# Patient Record
Sex: Female | Born: 1960 | Race: White | Hispanic: No | Marital: Married | Smoking: Never smoker
Health system: Southern US, Community
[De-identification: ages and names within clinical notes are randomized; demographics above are authoritative.]

## PROBLEM LIST (undated history)

## (undated) DIAGNOSIS — I351 Nonrheumatic aortic (valve) insufficiency: Secondary | ICD-10-CM

## (undated) DIAGNOSIS — R4701 Aphasia: Secondary | ICD-10-CM

## (undated) DIAGNOSIS — I712 Thoracic aortic aneurysm, without rupture: Secondary | ICD-10-CM

## (undated) DIAGNOSIS — I7121 Aneurysm of the ascending aorta, without rupture: Secondary | ICD-10-CM

## (undated) DIAGNOSIS — I639 Cerebral infarction, unspecified: Secondary | ICD-10-CM

## (undated) HISTORY — DX: Aneurysm of the ascending aorta, without rupture: I71.21

## (undated) HISTORY — DX: Aphasia: R47.01

## (undated) HISTORY — DX: Cerebral infarction, unspecified: I63.9

## (undated) HISTORY — DX: Thoracic aortic aneurysm, without rupture: I71.2

## (undated) HISTORY — DX: Nonrheumatic aortic (valve) insufficiency: I35.1

---

## 2012-09-11 DIAGNOSIS — I77810 Thoracic aortic ectasia: Secondary | ICD-10-CM

## 2012-09-11 DIAGNOSIS — Q211 Atrial septal defect: Secondary | ICD-10-CM

## 2012-09-11 DIAGNOSIS — Q2112 Patent foramen ovale: Secondary | ICD-10-CM

## 2012-09-11 HISTORY — DX: Atrial septal defect: Q21.1

## 2012-09-11 HISTORY — DX: Thoracic aortic ectasia: I77.810

## 2012-09-11 HISTORY — DX: Patent foramen ovale: Q21.12

## 2016-02-07 DIAGNOSIS — Z8669 Personal history of other diseases of the nervous system and sense organs: Secondary | ICD-10-CM | POA: Diagnosis not present

## 2016-02-07 DIAGNOSIS — I10 Essential (primary) hypertension: Secondary | ICD-10-CM | POA: Diagnosis not present

## 2016-02-07 DIAGNOSIS — Z0001 Encounter for general adult medical examination with abnormal findings: Secondary | ICD-10-CM | POA: Diagnosis not present

## 2016-02-14 DIAGNOSIS — Z1231 Encounter for screening mammogram for malignant neoplasm of breast: Secondary | ICD-10-CM | POA: Diagnosis not present

## 2016-02-16 DIAGNOSIS — R634 Abnormal weight loss: Secondary | ICD-10-CM | POA: Diagnosis not present

## 2016-02-23 DIAGNOSIS — I77810 Thoracic aortic ectasia: Secondary | ICD-10-CM | POA: Diagnosis not present

## 2016-02-23 DIAGNOSIS — R9431 Abnormal electrocardiogram [ECG] [EKG]: Secondary | ICD-10-CM | POA: Diagnosis not present

## 2016-02-23 DIAGNOSIS — Q211 Atrial septal defect: Secondary | ICD-10-CM | POA: Diagnosis not present

## 2016-02-23 HISTORY — PX: OTHER SURGICAL HISTORY: SHX169

## 2016-03-03 DIAGNOSIS — Z86010 Personal history of colonic polyps: Secondary | ICD-10-CM | POA: Diagnosis not present

## 2016-03-03 DIAGNOSIS — Z8673 Personal history of transient ischemic attack (TIA), and cerebral infarction without residual deficits: Secondary | ICD-10-CM | POA: Diagnosis not present

## 2016-03-03 DIAGNOSIS — Z1211 Encounter for screening for malignant neoplasm of colon: Secondary | ICD-10-CM | POA: Diagnosis not present

## 2016-03-03 DIAGNOSIS — I10 Essential (primary) hypertension: Secondary | ICD-10-CM | POA: Diagnosis not present

## 2016-03-03 DIAGNOSIS — D124 Benign neoplasm of descending colon: Secondary | ICD-10-CM | POA: Diagnosis not present

## 2016-03-03 DIAGNOSIS — D125 Benign neoplasm of sigmoid colon: Secondary | ICD-10-CM | POA: Diagnosis not present

## 2016-03-03 DIAGNOSIS — D126 Benign neoplasm of colon, unspecified: Secondary | ICD-10-CM | POA: Diagnosis not present

## 2016-03-03 DIAGNOSIS — D122 Benign neoplasm of ascending colon: Secondary | ICD-10-CM | POA: Diagnosis not present

## 2016-03-03 DIAGNOSIS — K5730 Diverticulosis of large intestine without perforation or abscess without bleeding: Secondary | ICD-10-CM | POA: Diagnosis not present

## 2016-03-03 DIAGNOSIS — Z79899 Other long term (current) drug therapy: Secondary | ICD-10-CM | POA: Diagnosis not present

## 2016-03-06 DIAGNOSIS — Z01419 Encounter for gynecological examination (general) (routine) without abnormal findings: Secondary | ICD-10-CM | POA: Diagnosis not present

## 2016-03-08 DIAGNOSIS — Q211 Atrial septal defect: Secondary | ICD-10-CM | POA: Diagnosis not present

## 2016-03-08 DIAGNOSIS — J9811 Atelectasis: Secondary | ICD-10-CM | POA: Diagnosis not present

## 2016-03-08 DIAGNOSIS — R05 Cough: Secondary | ICD-10-CM | POA: Diagnosis not present

## 2016-03-08 DIAGNOSIS — I69320 Aphasia following cerebral infarction: Secondary | ICD-10-CM | POA: Diagnosis not present

## 2016-03-08 DIAGNOSIS — I7101 Dissection of thoracic aorta: Secondary | ICD-10-CM | POA: Diagnosis not present

## 2016-03-08 DIAGNOSIS — J40 Bronchitis, not specified as acute or chronic: Secondary | ICD-10-CM | POA: Diagnosis not present

## 2016-03-08 DIAGNOSIS — I083 Combined rheumatic disorders of mitral, aortic and tricuspid valves: Secondary | ICD-10-CM | POA: Diagnosis present

## 2016-03-08 DIAGNOSIS — R0689 Other abnormalities of breathing: Secondary | ICD-10-CM | POA: Diagnosis not present

## 2016-03-08 DIAGNOSIS — R1310 Dysphagia, unspecified: Secondary | ICD-10-CM | POA: Diagnosis not present

## 2016-03-08 DIAGNOSIS — I712 Thoracic aortic aneurysm, without rupture: Secondary | ICD-10-CM | POA: Diagnosis not present

## 2016-03-08 DIAGNOSIS — Z4682 Encounter for fitting and adjustment of non-vascular catheter: Secondary | ICD-10-CM | POA: Diagnosis not present

## 2016-03-08 DIAGNOSIS — J939 Pneumothorax, unspecified: Secondary | ICD-10-CM | POA: Diagnosis not present

## 2016-03-08 DIAGNOSIS — J9589 Other postprocedural complications and disorders of respiratory system, not elsewhere classified: Secondary | ICD-10-CM | POA: Diagnosis not present

## 2016-03-08 DIAGNOSIS — D62 Acute posthemorrhagic anemia: Secondary | ICD-10-CM | POA: Diagnosis not present

## 2016-03-08 DIAGNOSIS — I081 Rheumatic disorders of both mitral and tricuspid valves: Secondary | ICD-10-CM | POA: Diagnosis not present

## 2016-03-08 DIAGNOSIS — R0602 Shortness of breath: Secondary | ICD-10-CM | POA: Diagnosis not present

## 2016-03-08 DIAGNOSIS — R918 Other nonspecific abnormal finding of lung field: Secondary | ICD-10-CM | POA: Diagnosis not present

## 2016-03-08 DIAGNOSIS — R739 Hyperglycemia, unspecified: Secondary | ICD-10-CM | POA: Diagnosis not present

## 2016-03-08 DIAGNOSIS — I517 Cardiomegaly: Secondary | ICD-10-CM | POA: Diagnosis not present

## 2016-03-08 DIAGNOSIS — I340 Nonrheumatic mitral (valve) insufficiency: Secondary | ICD-10-CM | POA: Diagnosis not present

## 2016-03-08 DIAGNOSIS — R0902 Hypoxemia: Secondary | ICD-10-CM | POA: Diagnosis not present

## 2016-03-08 DIAGNOSIS — I519 Heart disease, unspecified: Secondary | ICD-10-CM | POA: Diagnosis not present

## 2016-03-08 DIAGNOSIS — Z48812 Encounter for surgical aftercare following surgery on the circulatory system: Secondary | ICD-10-CM | POA: Diagnosis not present

## 2016-03-08 DIAGNOSIS — I5189 Other ill-defined heart diseases: Secondary | ICD-10-CM | POA: Diagnosis not present

## 2016-03-08 DIAGNOSIS — J984 Other disorders of lung: Secondary | ICD-10-CM | POA: Diagnosis not present

## 2016-03-08 DIAGNOSIS — I77810 Thoracic aortic ectasia: Secondary | ICD-10-CM | POA: Diagnosis not present

## 2016-03-08 DIAGNOSIS — J90 Pleural effusion, not elsewhere classified: Secondary | ICD-10-CM | POA: Diagnosis not present

## 2016-03-08 DIAGNOSIS — I4891 Unspecified atrial fibrillation: Secondary | ICD-10-CM | POA: Diagnosis not present

## 2016-03-08 DIAGNOSIS — I69311 Memory deficit following cerebral infarction: Secondary | ICD-10-CM | POA: Diagnosis not present

## 2016-03-08 DIAGNOSIS — I7411 Embolism and thrombosis of thoracic aorta: Secondary | ICD-10-CM | POA: Diagnosis not present

## 2016-03-09 DIAGNOSIS — I71019 Dissection of thoracic aorta, unspecified: Secondary | ICD-10-CM | POA: Insufficient documentation

## 2016-03-09 DIAGNOSIS — I7101 Dissection of thoracic aorta: Secondary | ICD-10-CM | POA: Insufficient documentation

## 2016-03-09 HISTORY — DX: Dissection of thoracic aorta, unspecified: I71.019

## 2016-03-09 HISTORY — DX: Dissection of thoracic aorta: I71.01

## 2016-03-10 DIAGNOSIS — R739 Hyperglycemia, unspecified: Secondary | ICD-10-CM | POA: Insufficient documentation

## 2016-03-10 HISTORY — DX: Hyperglycemia, unspecified: R73.9

## 2016-03-28 DIAGNOSIS — K449 Diaphragmatic hernia without obstruction or gangrene: Secondary | ICD-10-CM | POA: Diagnosis not present

## 2016-03-28 DIAGNOSIS — Z48812 Encounter for surgical aftercare following surgery on the circulatory system: Secondary | ICD-10-CM | POA: Diagnosis not present

## 2016-03-28 DIAGNOSIS — E7800 Pure hypercholesterolemia, unspecified: Secondary | ICD-10-CM | POA: Diagnosis not present

## 2016-03-28 DIAGNOSIS — R9431 Abnormal electrocardiogram [ECG] [EKG]: Secondary | ICD-10-CM | POA: Diagnosis not present

## 2016-03-28 DIAGNOSIS — R918 Other nonspecific abnormal finding of lung field: Secondary | ICD-10-CM | POA: Diagnosis not present

## 2016-03-28 DIAGNOSIS — J9811 Atelectasis: Secondary | ICD-10-CM | POA: Diagnosis not present

## 2016-03-28 DIAGNOSIS — Z9889 Other specified postprocedural states: Secondary | ICD-10-CM | POA: Diagnosis not present

## 2016-04-18 DIAGNOSIS — I429 Cardiomyopathy, unspecified: Secondary | ICD-10-CM | POA: Diagnosis not present

## 2016-04-18 DIAGNOSIS — I7101 Dissection of thoracic aorta: Secondary | ICD-10-CM | POA: Diagnosis not present

## 2016-08-21 DIAGNOSIS — Z9889 Other specified postprocedural states: Secondary | ICD-10-CM | POA: Diagnosis not present

## 2016-08-21 DIAGNOSIS — I429 Cardiomyopathy, unspecified: Secondary | ICD-10-CM | POA: Diagnosis not present

## 2016-08-21 DIAGNOSIS — Q211 Atrial septal defect: Secondary | ICD-10-CM | POA: Diagnosis not present

## 2016-09-08 DIAGNOSIS — D519 Vitamin B12 deficiency anemia, unspecified: Secondary | ICD-10-CM | POA: Diagnosis not present

## 2016-09-08 DIAGNOSIS — R5383 Other fatigue: Secondary | ICD-10-CM | POA: Diagnosis not present

## 2016-09-08 DIAGNOSIS — I10 Essential (primary) hypertension: Secondary | ICD-10-CM | POA: Diagnosis not present

## 2016-09-08 DIAGNOSIS — E559 Vitamin D deficiency, unspecified: Secondary | ICD-10-CM | POA: Diagnosis not present

## 2016-09-08 DIAGNOSIS — R5381 Other malaise: Secondary | ICD-10-CM | POA: Diagnosis not present

## 2016-09-08 DIAGNOSIS — Z79899 Other long term (current) drug therapy: Secondary | ICD-10-CM | POA: Diagnosis not present

## 2016-09-13 DIAGNOSIS — Z9889 Other specified postprocedural states: Secondary | ICD-10-CM | POA: Diagnosis not present

## 2016-09-13 DIAGNOSIS — Q211 Atrial septal defect: Secondary | ICD-10-CM | POA: Diagnosis not present

## 2016-09-13 DIAGNOSIS — I429 Cardiomyopathy, unspecified: Secondary | ICD-10-CM | POA: Diagnosis not present

## 2016-10-12 DIAGNOSIS — I519 Heart disease, unspecified: Secondary | ICD-10-CM | POA: Diagnosis not present

## 2016-10-12 DIAGNOSIS — I255 Ischemic cardiomyopathy: Secondary | ICD-10-CM | POA: Diagnosis not present

## 2016-12-28 DIAGNOSIS — M5137 Other intervertebral disc degeneration, lumbosacral region: Secondary | ICD-10-CM | POA: Diagnosis not present

## 2016-12-28 DIAGNOSIS — M9904 Segmental and somatic dysfunction of sacral region: Secondary | ICD-10-CM | POA: Diagnosis not present

## 2016-12-28 DIAGNOSIS — M9903 Segmental and somatic dysfunction of lumbar region: Secondary | ICD-10-CM | POA: Diagnosis not present

## 2016-12-28 DIAGNOSIS — M5136 Other intervertebral disc degeneration, lumbar region: Secondary | ICD-10-CM | POA: Diagnosis not present

## 2016-12-28 DIAGNOSIS — M5441 Lumbago with sciatica, right side: Secondary | ICD-10-CM | POA: Diagnosis not present

## 2017-01-02 DIAGNOSIS — M9903 Segmental and somatic dysfunction of lumbar region: Secondary | ICD-10-CM | POA: Diagnosis not present

## 2017-01-02 DIAGNOSIS — M9904 Segmental and somatic dysfunction of sacral region: Secondary | ICD-10-CM | POA: Diagnosis not present

## 2017-01-02 DIAGNOSIS — M5137 Other intervertebral disc degeneration, lumbosacral region: Secondary | ICD-10-CM | POA: Diagnosis not present

## 2017-01-02 DIAGNOSIS — M5441 Lumbago with sciatica, right side: Secondary | ICD-10-CM | POA: Diagnosis not present

## 2017-01-02 DIAGNOSIS — M5136 Other intervertebral disc degeneration, lumbar region: Secondary | ICD-10-CM | POA: Diagnosis not present

## 2017-01-04 DIAGNOSIS — M9904 Segmental and somatic dysfunction of sacral region: Secondary | ICD-10-CM | POA: Diagnosis not present

## 2017-01-04 DIAGNOSIS — M5136 Other intervertebral disc degeneration, lumbar region: Secondary | ICD-10-CM | POA: Diagnosis not present

## 2017-01-04 DIAGNOSIS — M9903 Segmental and somatic dysfunction of lumbar region: Secondary | ICD-10-CM | POA: Diagnosis not present

## 2017-01-04 DIAGNOSIS — M5137 Other intervertebral disc degeneration, lumbosacral region: Secondary | ICD-10-CM | POA: Diagnosis not present

## 2017-01-04 DIAGNOSIS — M5441 Lumbago with sciatica, right side: Secondary | ICD-10-CM | POA: Diagnosis not present

## 2017-01-17 DIAGNOSIS — M5441 Lumbago with sciatica, right side: Secondary | ICD-10-CM | POA: Diagnosis not present

## 2017-01-17 DIAGNOSIS — M5137 Other intervertebral disc degeneration, lumbosacral region: Secondary | ICD-10-CM | POA: Diagnosis not present

## 2017-01-17 DIAGNOSIS — M9903 Segmental and somatic dysfunction of lumbar region: Secondary | ICD-10-CM | POA: Diagnosis not present

## 2017-01-17 DIAGNOSIS — M5136 Other intervertebral disc degeneration, lumbar region: Secondary | ICD-10-CM | POA: Diagnosis not present

## 2017-01-17 DIAGNOSIS — M9904 Segmental and somatic dysfunction of sacral region: Secondary | ICD-10-CM | POA: Diagnosis not present

## 2017-01-22 DIAGNOSIS — M5136 Other intervertebral disc degeneration, lumbar region: Secondary | ICD-10-CM | POA: Diagnosis not present

## 2017-01-22 DIAGNOSIS — M9903 Segmental and somatic dysfunction of lumbar region: Secondary | ICD-10-CM | POA: Diagnosis not present

## 2017-01-22 DIAGNOSIS — M5137 Other intervertebral disc degeneration, lumbosacral region: Secondary | ICD-10-CM | POA: Diagnosis not present

## 2017-01-22 DIAGNOSIS — M5441 Lumbago with sciatica, right side: Secondary | ICD-10-CM | POA: Diagnosis not present

## 2017-01-22 DIAGNOSIS — M9904 Segmental and somatic dysfunction of sacral region: Secondary | ICD-10-CM | POA: Diagnosis not present

## 2017-01-29 DIAGNOSIS — M9903 Segmental and somatic dysfunction of lumbar region: Secondary | ICD-10-CM | POA: Diagnosis not present

## 2017-01-29 DIAGNOSIS — M9904 Segmental and somatic dysfunction of sacral region: Secondary | ICD-10-CM | POA: Diagnosis not present

## 2017-01-29 DIAGNOSIS — M5441 Lumbago with sciatica, right side: Secondary | ICD-10-CM | POA: Diagnosis not present

## 2017-01-29 DIAGNOSIS — M5136 Other intervertebral disc degeneration, lumbar region: Secondary | ICD-10-CM | POA: Diagnosis not present

## 2017-01-29 DIAGNOSIS — M5137 Other intervertebral disc degeneration, lumbosacral region: Secondary | ICD-10-CM | POA: Diagnosis not present

## 2017-02-23 DIAGNOSIS — M5136 Other intervertebral disc degeneration, lumbar region: Secondary | ICD-10-CM | POA: Diagnosis not present

## 2017-02-23 DIAGNOSIS — M5441 Lumbago with sciatica, right side: Secondary | ICD-10-CM | POA: Diagnosis not present

## 2017-02-23 DIAGNOSIS — M9903 Segmental and somatic dysfunction of lumbar region: Secondary | ICD-10-CM | POA: Diagnosis not present

## 2017-02-23 DIAGNOSIS — M9904 Segmental and somatic dysfunction of sacral region: Secondary | ICD-10-CM | POA: Diagnosis not present

## 2017-02-23 DIAGNOSIS — M5137 Other intervertebral disc degeneration, lumbosacral region: Secondary | ICD-10-CM | POA: Diagnosis not present

## 2017-03-30 DIAGNOSIS — M5441 Lumbago with sciatica, right side: Secondary | ICD-10-CM | POA: Diagnosis not present

## 2017-03-30 DIAGNOSIS — M5137 Other intervertebral disc degeneration, lumbosacral region: Secondary | ICD-10-CM | POA: Diagnosis not present

## 2017-03-30 DIAGNOSIS — M5136 Other intervertebral disc degeneration, lumbar region: Secondary | ICD-10-CM | POA: Diagnosis not present

## 2017-03-30 DIAGNOSIS — M9903 Segmental and somatic dysfunction of lumbar region: Secondary | ICD-10-CM | POA: Diagnosis not present

## 2017-03-30 DIAGNOSIS — M9904 Segmental and somatic dysfunction of sacral region: Secondary | ICD-10-CM | POA: Diagnosis not present

## 2017-04-10 DIAGNOSIS — I10 Essential (primary) hypertension: Secondary | ICD-10-CM | POA: Diagnosis not present

## 2017-04-25 ENCOUNTER — Ambulatory Visit (INDEPENDENT_AMBULATORY_CARE_PROVIDER_SITE_OTHER): Payer: Medicare HMO

## 2017-04-25 ENCOUNTER — Ambulatory Visit (INDEPENDENT_AMBULATORY_CARE_PROVIDER_SITE_OTHER): Payer: Medicare HMO | Admitting: Sports Medicine

## 2017-04-25 ENCOUNTER — Encounter: Payer: Self-pay | Admitting: Sports Medicine

## 2017-04-25 DIAGNOSIS — R52 Pain, unspecified: Secondary | ICD-10-CM

## 2017-04-25 DIAGNOSIS — M2170 Unequal limb length (acquired), unspecified site: Secondary | ICD-10-CM | POA: Diagnosis not present

## 2017-04-25 DIAGNOSIS — M2142 Flat foot [pes planus] (acquired), left foot: Secondary | ICD-10-CM

## 2017-04-25 DIAGNOSIS — R269 Unspecified abnormalities of gait and mobility: Secondary | ICD-10-CM | POA: Diagnosis not present

## 2017-04-25 DIAGNOSIS — M2141 Flat foot [pes planus] (acquired), right foot: Secondary | ICD-10-CM

## 2017-04-25 NOTE — Progress Notes (Signed)
Subjective: Stacy Jenkins is a 56 y.o. female patient who presents to office for evaluation of both feet and ankles turning out work. Patient reports that there is no pain, however, significant functional abnormality. Her husband. Patient has suffered a stroke with the subsequent changes to both feet and ankles. Admits a history of flat feet ever since she was a child. However, since after stroke areas have worsened in appearance currently uses new balance tennis shoes and over-the-counter Dr. Felicie Morn cushioned insole. Patient denies any other pedal complaints.   There are no active problems to display for this patient.  No current outpatient prescriptions on file prior to visit.   No current facility-administered medications on file prior to visit.    Allergies  Allergen Reactions  . Penicillin G Rash     Objective:  General: Alert and oriented x3 in no acute distress  Dermatology: No open lesions bilateral lower extremities, no webspace macerations, no ecchymosis bilateral, all nails x 10 are well manicured.  Vascular: Dorsalis Pedis and Posterior Tibial pedal pulses 1/4, Capillary Fill Time 3 seconds, (+) pedal hair growth bilateral, no edema bilateral lower extremities, Temperature gradient within normal limits.  Neurology: Gross sensation intact via light touch bilateral.  Musculoskeletal:No tenderness to palpation along medial arch, or fascial band bilateral. There is no tenderness along Posterior tibial tendon course with medial soft tissue buldge noted, there is decreased ankle rom with knee extending  vs flexed resembling gastroc equnius bilateral, Subtalar joint range of motion is limited, there is medial arch collapse Right> Left on weightbearing exam, end-stage valgus with ankle impingement and ankle valgus, + "too-many toes" sign appreciated, unable to perform heel rise test due to severity of flat feet and history of stroke with the right limb being longer and the left limb  shorter with a small amount of strength difference between the left and right with the left being greater than and 4 out of 5 in the right is graded 5 out of 5.  Gait apropulsive.  Xrays Right/Left ankle:  Decreased osseous mineralization. Joint spaces not preserved, arthritis and joint space narrowing consistent with severe changes from pes planus. No fracture. Increased Talar head uncovering present. Anterior break in cyma line with midtarsal breach present. Increased Talar declination present. Decreased calcaneal inclination present.  No soft tissue abnormalities or radiopaque foreign bodies.   Assessment and Plan: Problem List Items Addressed This Visit    None    Visit Diagnoses    Pes planus of both feet    -  Primary   Pain       Relevant Orders   DG Ankle Complete Left   DG Ankle Complete Right   Abnormality of gait       Lower limb length difference       Right longer      -Complete examination performed -Xrays reviewed -Discussed treatement options; discussed pes planus deformity;conservative and  surgical including but not limited to the use of bone grafts, bone cuts, tendon lengthening/reconstruction to achieve improvement in flatfoot deformity on Right>Left foot -Recommend patient to have a AFO or SMO/UCBL orthotic made to help control and prevent further changes from severe pes planus -Meanwhile Recommend continue with good supportive shoes  -Patient to return to office to see for SMO/UCBL or AFO casting  Landis Martins, DPM

## 2017-04-25 NOTE — Progress Notes (Signed)
   Subjective:    Patient ID: Stacy Jenkins, female    DOB: 10-Jan-1961, 56 y.o.   MRN: [id number]  HPI   Husband states that patient had a stroke and that the ankles turn in and patient states that they do not hurt and I am not a diabetic and do have inserts and no burning or throbbing and not any swelling and no numbness or tingling     Review of Systems  All other systems reviewed and are negative.      Objective:   Physical Exam        Assessment & Plan:

## 2017-04-30 DIAGNOSIS — M9903 Segmental and somatic dysfunction of lumbar region: Secondary | ICD-10-CM | POA: Diagnosis not present

## 2017-04-30 DIAGNOSIS — M5137 Other intervertebral disc degeneration, lumbosacral region: Secondary | ICD-10-CM | POA: Diagnosis not present

## 2017-04-30 DIAGNOSIS — M9904 Segmental and somatic dysfunction of sacral region: Secondary | ICD-10-CM | POA: Diagnosis not present

## 2017-04-30 DIAGNOSIS — M5441 Lumbago with sciatica, right side: Secondary | ICD-10-CM | POA: Diagnosis not present

## 2017-04-30 DIAGNOSIS — M5136 Other intervertebral disc degeneration, lumbar region: Secondary | ICD-10-CM | POA: Diagnosis not present

## 2017-05-02 ENCOUNTER — Ambulatory Visit (INDEPENDENT_AMBULATORY_CARE_PROVIDER_SITE_OTHER): Payer: Medicare HMO | Admitting: Sports Medicine

## 2017-05-02 DIAGNOSIS — M2142 Flat foot [pes planus] (acquired), left foot: Secondary | ICD-10-CM | POA: Diagnosis not present

## 2017-05-02 DIAGNOSIS — R269 Unspecified abnormalities of gait and mobility: Secondary | ICD-10-CM | POA: Diagnosis not present

## 2017-05-02 DIAGNOSIS — M2170 Unequal limb length (acquired), unspecified site: Secondary | ICD-10-CM | POA: Diagnosis not present

## 2017-05-02 DIAGNOSIS — M722 Plantar fascial fibromatosis: Secondary | ICD-10-CM | POA: Diagnosis not present

## 2017-05-02 DIAGNOSIS — M2141 Flat foot [pes planus] (acquired), right foot: Secondary | ICD-10-CM | POA: Diagnosis not present

## 2017-05-02 NOTE — Progress Notes (Addendum)
Patient met with Stacy Jenkins and was casted for custom molded UCBL orthotics. Rx sent to [facility]. Patient to return to pick up orthotics when they arrive to office Dr. Cannon Kettle

## 2017-05-15 DIAGNOSIS — Z9889 Other specified postprocedural states: Secondary | ICD-10-CM | POA: Diagnosis not present

## 2017-05-15 DIAGNOSIS — I10 Essential (primary) hypertension: Secondary | ICD-10-CM | POA: Diagnosis not present

## 2017-05-15 DIAGNOSIS — I519 Heart disease, unspecified: Secondary | ICD-10-CM | POA: Diagnosis not present

## 2017-05-15 DIAGNOSIS — Q211 Atrial septal defect: Secondary | ICD-10-CM | POA: Diagnosis not present

## 2017-05-15 DIAGNOSIS — I088 Other rheumatic multiple valve diseases: Secondary | ICD-10-CM | POA: Diagnosis not present

## 2017-05-15 DIAGNOSIS — I429 Cardiomyopathy, unspecified: Secondary | ICD-10-CM | POA: Diagnosis not present

## 2017-06-05 DIAGNOSIS — M5136 Other intervertebral disc degeneration, lumbar region: Secondary | ICD-10-CM | POA: Diagnosis not present

## 2017-06-05 DIAGNOSIS — M9904 Segmental and somatic dysfunction of sacral region: Secondary | ICD-10-CM | POA: Diagnosis not present

## 2017-06-05 DIAGNOSIS — M5441 Lumbago with sciatica, right side: Secondary | ICD-10-CM | POA: Diagnosis not present

## 2017-06-05 DIAGNOSIS — M9903 Segmental and somatic dysfunction of lumbar region: Secondary | ICD-10-CM | POA: Diagnosis not present

## 2017-06-05 DIAGNOSIS — M5137 Other intervertebral disc degeneration, lumbosacral region: Secondary | ICD-10-CM | POA: Diagnosis not present

## 2017-06-06 ENCOUNTER — Ambulatory Visit (INDEPENDENT_AMBULATORY_CARE_PROVIDER_SITE_OTHER): Payer: Medicare HMO | Admitting: Sports Medicine

## 2017-06-06 DIAGNOSIS — M722 Plantar fascial fibromatosis: Secondary | ICD-10-CM

## 2017-06-06 DIAGNOSIS — M2170 Unequal limb length (acquired), unspecified site: Secondary | ICD-10-CM

## 2017-06-06 DIAGNOSIS — R269 Unspecified abnormalities of gait and mobility: Secondary | ICD-10-CM

## 2017-06-06 DIAGNOSIS — M2142 Flat foot [pes planus] (acquired), left foot: Secondary | ICD-10-CM

## 2017-06-06 DIAGNOSIS — M2141 Flat foot [pes planus] (acquired), right foot: Secondary | ICD-10-CM

## 2017-06-06 NOTE — Progress Notes (Signed)
Patient was seen and evaluated by [facility]. Patient was dispensed 1 pair of custom functional foot orthotics with wear and break-in period explained. Patient to return for orthotic check as scheduled. -Dr. Cannon Kettle

## 2017-07-13 ENCOUNTER — Encounter (HOSPITAL_COMMUNITY): Payer: Self-pay | Admitting: General Practice

## 2017-07-13 ENCOUNTER — Inpatient Hospital Stay (HOSPITAL_COMMUNITY)
Admission: AD | Admit: 2017-07-13 | Discharge: 2017-07-16 | DRG: 064 | Disposition: A | Discharge status: Home / Self Care | Payer: Medicare HMO | Source: Other Acute Inpatient Hospital | Attending: Neurology | Admitting: Neurology

## 2017-07-13 DIAGNOSIS — I6300 Cerebral infarction due to thrombosis of unspecified precerebral artery: Secondary | ICD-10-CM | POA: Diagnosis not present

## 2017-07-13 DIAGNOSIS — E663 Overweight: Secondary | ICD-10-CM | POA: Diagnosis not present

## 2017-07-13 DIAGNOSIS — R4701 Aphasia: Secondary | ICD-10-CM

## 2017-07-13 DIAGNOSIS — Z79899 Other long term (current) drug therapy: Secondary | ICD-10-CM | POA: Diagnosis not present

## 2017-07-13 DIAGNOSIS — Z9282 Status post administration of tPA (rtPA) in a different facility within the last 24 hours prior to admission to current facility: Secondary | ICD-10-CM

## 2017-07-13 DIAGNOSIS — I69320 Aphasia following cerebral infarction: Secondary | ICD-10-CM

## 2017-07-13 DIAGNOSIS — Z7982 Long term (current) use of aspirin: Secondary | ICD-10-CM | POA: Diagnosis not present

## 2017-07-13 DIAGNOSIS — I639 Cerebral infarction, unspecified: Secondary | ICD-10-CM | POA: Diagnosis not present

## 2017-07-13 DIAGNOSIS — I480 Paroxysmal atrial fibrillation: Secondary | ICD-10-CM | POA: Diagnosis present

## 2017-07-13 DIAGNOSIS — I351 Nonrheumatic aortic (valve) insufficiency: Secondary | ICD-10-CM | POA: Diagnosis not present

## 2017-07-13 DIAGNOSIS — R29704 NIHSS score 4: Secondary | ICD-10-CM | POA: Diagnosis not present

## 2017-07-13 DIAGNOSIS — I10 Essential (primary) hypertension: Secondary | ICD-10-CM | POA: Diagnosis present

## 2017-07-13 DIAGNOSIS — R531 Weakness: Secondary | ICD-10-CM | POA: Diagnosis not present

## 2017-07-13 DIAGNOSIS — I7121 Aneurysm of the ascending aorta, without rupture: Secondary | ICD-10-CM

## 2017-07-13 DIAGNOSIS — Z6826 Body mass index (BMI) 26.0-26.9, adult: Secondary | ICD-10-CM

## 2017-07-13 DIAGNOSIS — R402143 Coma scale, eyes open, spontaneous, at hospital admission: Secondary | ICD-10-CM | POA: Diagnosis present

## 2017-07-13 DIAGNOSIS — I7771 Dissection of carotid artery: Secondary | ICD-10-CM | POA: Diagnosis not present

## 2017-07-13 DIAGNOSIS — Z8673 Personal history of transient ischemic attack (TIA), and cerebral infarction without residual deficits: Secondary | ICD-10-CM | POA: Diagnosis not present

## 2017-07-13 DIAGNOSIS — E785 Hyperlipidemia, unspecified: Secondary | ICD-10-CM | POA: Diagnosis not present

## 2017-07-13 DIAGNOSIS — I63512 Cerebral infarction due to unspecified occlusion or stenosis of left middle cerebral artery: Principal | ICD-10-CM | POA: Diagnosis present

## 2017-07-13 DIAGNOSIS — Q211 Atrial septal defect: Secondary | ICD-10-CM

## 2017-07-13 DIAGNOSIS — R402363 Coma scale, best motor response, obeys commands, at hospital admission: Secondary | ICD-10-CM | POA: Diagnosis present

## 2017-07-13 DIAGNOSIS — Z880 Allergy status to penicillin: Secondary | ICD-10-CM

## 2017-07-13 DIAGNOSIS — R29810 Facial weakness: Secondary | ICD-10-CM | POA: Diagnosis present

## 2017-07-13 DIAGNOSIS — R402253 Coma scale, best verbal response, oriented, at hospital admission: Secondary | ICD-10-CM | POA: Diagnosis present

## 2017-07-13 DIAGNOSIS — I63312 Cerebral infarction due to thrombosis of left middle cerebral artery: Secondary | ICD-10-CM | POA: Diagnosis not present

## 2017-07-13 DIAGNOSIS — I712 Thoracic aortic aneurysm, without rupture: Secondary | ICD-10-CM

## 2017-07-13 DIAGNOSIS — R29818 Other symptoms and signs involving the nervous system: Secondary | ICD-10-CM | POA: Diagnosis not present

## 2017-07-13 DIAGNOSIS — I361 Nonrheumatic tricuspid (valve) insufficiency: Secondary | ICD-10-CM | POA: Diagnosis not present

## 2017-07-13 HISTORY — DX: Cerebral infarction, unspecified: I63.9

## 2017-07-13 LAB — MRSA PCR SCREENING: MRSA BY PCR: NEGATIVE

## 2017-07-13 MED ORDER — SODIUM CHLORIDE 0.9 % IV BOLUS (SEPSIS)
1000.0000 mL | Freq: Once | INTRAVENOUS | Status: AC
  Administered 2017-07-13: 1000 mL via INTRAVENOUS

## 2017-07-13 MED ORDER — PANTOPRAZOLE SODIUM 40 MG IV SOLR
40.0000 mg | Freq: Every day | INTRAVENOUS | Status: DC
  Administered 2017-07-13: 40 mg via INTRAVENOUS
  Filled 2017-07-13: qty 40

## 2017-07-13 MED ORDER — ACETAMINOPHEN 160 MG/5ML PO SOLN: 650.0000 mg | ORAL | Status: DC | PRN

## 2017-07-13 MED ORDER — STROKE: EARLY STAGES OF RECOVERY BOOK
Freq: Once | Status: AC
  Administered 2017-07-13: 18:00:00
  Filled 2017-07-13: qty 1

## 2017-07-13 MED ORDER — ACETAMINOPHEN 650 MG RE SUPP: 650.0000 mg | RECTAL | Status: DC | PRN

## 2017-07-13 MED ORDER — SODIUM CHLORIDE 0.9 % IV SOLN
INTRAVENOUS | Status: DC
  Administered 2017-07-13 – 2017-07-15 (×3): via INTRAVENOUS

## 2017-07-13 MED ORDER — ACETAMINOPHEN 325 MG PO TABS
650.0000 mg | ORAL_TABLET | ORAL | Status: DC | PRN
  Administered 2017-07-13 – 2017-07-15 (×3): 650 mg via ORAL
  Filled 2017-07-13 (×3): qty 2

## 2017-07-13 NOTE — H&P (Signed)
Neurology H&P  CC: Aphasia  History is obtained from:patient   HPI: Stacy Jenkins is a 56 y.o. female with a history of previous stroke which caused some difficulty with speech who presented to [place] [facility]. A code stroke was called and she was given IV tpa by teleneurology.   She did have some improvement with IV tpa but still with persistent aphasia.    LKW: 12:30 pm tpa given?: yes  ROS: A 14 point ROS was performed and is negative except as noted in the HPI.  Past Medical History:  Diagnosis Date  . Stroke [facility])    Home meds: carvedilol, lisinopril, pravastatin, ASA  History reviewed. No pertinent family history.   Social History:  has an unknown smoking status. She has never used smokeless tobacco. Her alcohol and drug histories are not on file.   Exam: Current vital signs: BP 124/69   Pulse 71   Temp 98.4 F (36.9 C) (Oral)   Resp 16   Ht 5\' 7"  (1.702 m)   Wt 79.1 kg (174 lb 6.1 oz)   SpO2 100%   BMI 27.31 kg/m  Vital signs in last 24 hours: Temp:  [98.4 F (36.9 C)] 98.4 F (36.9 C) (07/20 1730) Pulse Rate:  [66-76] 71 (07/20 1845) Resp:  [16-18] 16 (07/20 1845) BP: (124-145)/(63-81) 124/69 (07/20 1845) SpO2:  [97 %-100 %] 100 % (07/20 1845) Weight:  [79.1 kg (174 lb 6.1 oz)] 79.1 kg (174 lb 6.1 oz) (07/20 1830)  Physical Exam  Constitutional: Appears well-developed and well-nourished.  Psych: Affect appropriate to situation Eyes: No scleral injection HENT: No OP obstrucion Head: Normocephalic.  Cardiovascular: Normal rate and regular rhythm.  Respiratory: Effort normal and breath sounds normal to anterior ascultation GI: Soft.  No distension. There is no tenderness.  Skin: WDI  Neuro: Mental Status: Patient is awake, alert, oriented to person, place, year She has a significant aphasia, though is able to follow some commands and answer some questions.  Cranial Nerves: II: Visual Fields are full. Pupils are equal, round, and reactive to  light.   III,IV, VI: EOMI without ptosis or diploplia.  V: Facial sensation is symmetric to temperature VII: Facial movement with mild right facial droop. VIII: hearing is intact to voice X: Uvula elevates symmetrically XI: Shoulder shrug is symmetric. XII: tongue is midline without atrophy or fasciculations.  Motor: Tone is normal. Bulk is normal. 5/5 strength was present in all four extremities.  Sensory: Sensation is symmetric to light touch and temperature in the arms and legs. Cerebellar: FNF and HKS are intact bilaterally  I have reviewed the images obtained: CT head - negaitve, CTA - no LVO  Impression: 56 yo F with acute aphasia and right facial weakness consistent with ischemic stroke now s/p IV tpa.   Recommendations: 1. HgbA1c, fasting lipid panel 2. MRI of the brain without contrast 24 hours after tpa 3. Frequent neuro checks 4. Echocardiogram 6. Prophylactic therapy-none for 24 hours.  7. Risk factor modification 8. Telemetry monitoring 9. PT consult, OT consult, Speech consult 10. please page stroke NP  Or  PA  Or MD  from 8am -4 pm as this patient will be followed by the stroke team at this point.   You can look them up on [email or web address]    Roland Rack, MD [facility] [phone]  If 7pm- 7am, please page neurology on call as listed in [place]. 07/13/2017  7:02 PM

## 2017-07-13 NOTE — Progress Notes (Signed)
Pt arrived at 1725 to 4N17.  Per transfer documentation and phone report, tPA administered at 1431.  MD notified of arrival, orders received for admission.  Per [facility] staff, pt slightly more confused upon arrival than when in transit.  Dr. Leonel Ramsay made aware, 1 L NS bolus ordered/administered.  Pt passed stroke swallow screen.  Educational documents given to family. See doc flowsheets for assessments.

## 2017-07-14 ENCOUNTER — Inpatient Hospital Stay (HOSPITAL_COMMUNITY): Payer: Medicare HMO

## 2017-07-14 DIAGNOSIS — I63512 Cerebral infarction due to unspecified occlusion or stenosis of left middle cerebral artery: Principal | ICD-10-CM

## 2017-07-14 DIAGNOSIS — I361 Nonrheumatic tricuspid (valve) insufficiency: Secondary | ICD-10-CM

## 2017-07-14 LAB — ECHOCARDIOGRAM COMPLETE
HEIGHTINCHES: 67 in
WEIGHTICAEL: 2790.14 [oz_av]

## 2017-07-14 LAB — RAPID URINE DRUG SCREEN, HOSP PERFORMED
Amphetamines: NOT DETECTED
BARBITURATES: NOT DETECTED
BENZODIAZEPINES: NOT DETECTED
Cocaine: NOT DETECTED
Opiates: NOT DETECTED
Tetrahydrocannabinol: NOT DETECTED

## 2017-07-14 LAB — URINALYSIS, ROUTINE W REFLEX MICROSCOPIC
Bacteria, UA: NONE SEEN
Bilirubin Urine: NEGATIVE
GLUCOSE, UA: NEGATIVE mg/dL
Ketones, ur: NEGATIVE mg/dL
Leukocytes, UA: NEGATIVE
Nitrite: NEGATIVE
PH: 6 (ref 5.0–8.0)
PROTEIN: NEGATIVE mg/dL
Specific Gravity, Urine: 1.01 (ref 1.005–1.030)

## 2017-07-14 LAB — LIPID PANEL
CHOL/HDL RATIO: 2.7 ratio
CHOLESTEROL: 142 mg/dL (ref 0–200)
HDL: 53 mg/dL (ref >40)
LDL CALC: 70 mg/dL (ref 0–99)
Triglycerides: 94 mg/dL (ref <150)
VLDL: 19 mg/dL (ref 0–40)

## 2017-07-14 LAB — HIV ANTIBODY (ROUTINE TESTING W REFLEX): HIV Screen 4th Generation wRfx: NONREACTIVE

## 2017-07-14 MED ORDER — ASPIRIN 81 MG PO CHEW
81.0000 mg | CHEWABLE_TABLET | Freq: Every day | ORAL | Status: DC
  Administered 2017-07-14 – 2017-07-16 (×3): 81 mg via ORAL
  Filled 2017-07-14 (×3): qty 1

## 2017-07-14 MED ORDER — PRAVASTATIN SODIUM 40 MG PO TABS
40.0000 mg | ORAL_TABLET | Freq: Every day | ORAL | Status: DC
Start: 2017-07-14 — End: 2017-07-16
  Administered 2017-07-14 – 2017-07-15 (×2): 40 mg via ORAL
  Filled 2017-07-14 (×2): qty 1

## 2017-07-14 MED ORDER — PANTOPRAZOLE SODIUM 40 MG PO TBEC
40.0000 mg | DELAYED_RELEASE_TABLET | Freq: Every day | ORAL | Status: DC
  Administered 2017-07-14 – 2017-07-15 (×2): 40 mg via ORAL
  Filled 2017-07-14 (×2): qty 1

## 2017-07-14 NOTE — Progress Notes (Signed)
Pt unavailable at this time for EEG. Pt going to MRI.

## 2017-07-14 NOTE — Progress Notes (Deleted)
Patient up in the bed.

## 2017-07-14 NOTE — Progress Notes (Signed)
Echocardiogram complete  

## 2017-07-14 NOTE — Progress Notes (Signed)
STROKE TEAM PROGRESS NOTE   HISTORY OF PRESENT ILLNESS (per record) Stacy Jenkins is a 56 y.o. female with a history of previous stroke which caused some difficulty with speech who presented to [facility]. A code stroke was called and she was given IV tpa by tele neurology from [facility].  She did have some improvement with IV tpa but still with persistent aphasia.   LKW: 12:30 pm tpa given?: yes  Code Stroke -> TPA at 1431 on Friday 07/13/2017  PMHX - 3 previous strokes treated at [facility], recent aortic aneurysm repair, "hole in the heart", chronic left internal carotid artery dissection.   SUBJECTIVE (INTERVAL HISTORY) The patient's husband was at the bedside. He provided most of the history. He reports the patient has had intermittent aphasia and confusion from her previous strokes. The patient follows most commands. Her aphasia appears to be mixed with both receptove and expressive components  OBJECTIVE Temp:  [98.1 F (36.7 C)-99.3 F (37.4 C)] 99.3 F (37.4 C) (07/21 0741) Pulse Rate:  [64-80] 67 (07/21 0800) Cardiac Rhythm: Normal sinus rhythm (07/21 0800) Resp:  [13-24] 16 (07/21 0800) BP: (110-145)/(57-86) 115/75 (07/21 0800) SpO2:  [94 %-100 %] 96 % (07/21 0800) Weight:  [79.1 kg (174 lb 6.1 oz)] 79.1 kg (174 lb 6.1 oz) (07/20 1830)  CBC: No results for input(s): WBC, NEUTROABS, HGB, HCT, MCV, PLT in the last 168 hours.  Basic Metabolic Panel: No results for input(s): NA, K, CL, CO2, GLUCOSE, BUN, CREATININE, CALCIUM, MG, PHOS in the last 168 hours.  Lipid Panel:     Component Value Date/Time   CHOL 142 07/14/2017 0241   TRIG 94 07/14/2017 0241   HDL 53 07/14/2017 0241   CHOLHDL 2.7 07/14/2017 0241   VLDL 19 07/14/2017 0241   LDLCALC 70 07/14/2017 0241   HgbA1c: No results found for: HGBA1C Urine Drug Screen: No results found for: LABOPIA, COCAINSCRNUR, LABBENZ, AMPHETMU, THCU, LABBARB  Alcohol Level No results found for:  ETH  IMAGING   (Multiple studies were performed at [facility])  MRI Head Wo Contrast - pending 07/14/2017  Transthoracic Echocardiogram 07/14/2017 Study Conclusions - Left ventricle: The cavity size was severely dilated. Systolic   function was mildly to moderately reduced. The estimated ejection   fraction was in the range of 40% to 45%. Images were inadequate   for overall LV wall motion assessment but there is akinesis of   the basal and mid inferoseptum and basal and mid anteroseptum   There was an increased relative contribution of atrial   contraction to ventricular filling. Doppler parameters are   consistent with abnormal left ventricular relaxation (grade 1   diastolic dysfunction). - Aortic valve: There was mild regurgitation. Valve area (Vmax):   0.93 cm^2. - Mitral valve: Calcified annulus. Moderate diffuse thickening.   There was trivial regurgitation. - Atrial septum: There was increased thickness of the septum,   consistent with lipomatous hypertrophy. - Impressions: AV not well visualized. Images are suspicious for   bicuspid AV in the parasternal long axis view. In the parasternal   short axis view there is thickening of the annulus. This can   sometimes be seen in annuloar abcess. The valve leaflets cannot   be visualized in this view. In the apical 5 chamber view there is   suspicion for possible vegetation and prolapse of the the aortic   cusp into the LVOT with moderate eccentric aortic insufficiency.   Recommend TEE for further evaluation. Impressions: - AV not  well visualized. Images are suspicious for bicuspid AV in   the parasternal long axis view. In the parasternal short axis   view there is thickening of the annulus. This can sometimes be   seen in annuloar abcess. The valve leaflets cannot be visualized   in this view. In the apical 5 chamber view there is suspicion for   possible vegetation and prolapse of the the aortic cusp into the    LVOT with moderate eccentric aortic insufficiency.    Recommend TEE for further evaluation.  PHYSICAL EXAM Pleasant middle aged 37 lady not in distress. . Afebrile. Head is nontraumatic. Neck is supple without bruit.    Cardiac exam no murmur or gallop. Lungs are clear to auscultation. Distal pulses are well felt.  Neurological Exam :  Awake alert mild expressive and mostly a receptive aphasia. Speech occasional word finding difficulty and disfluency but can speak  Follows only simple midline and one step commands.extra ocular movements are full range. Fundi were not visualized. Vision acuity seems adequate. Blinks to threat bilaterally. Face is symmetric without weakness. Tongue midline. Motor system exam shows no upper or lower extremity drift. No focal weakness. Deep tendon reflexes are symmetric. Plantars are downgoing. Sensation intact. Gait not tested.    ASSESSMENT/PLAN [name] is a 56 y.o. female with history of 3 previous strokes treated at [facility], recent aortic aneurysm repair," hole in the heart", and a chronic left internal carotid artery dissection presenting with aphasia.   TPA at 1431 on Friday 07/13/2017 at [facility] per telemetry neurologist from [facility].   Left MCA branchStroke ;in a patient with history of prior strokes and mild residual deficits    Resultant  Mild mixed aphasia.  CT head - not performed at [facility]  MRI head - pending  MRA head - CTA at [place] venous Dopplers - pending  EEG - pending  Carotid Doppler - CTA at [facility]  2D Echo - EF 40-45%. Suspicion for possible vegetation. TEE recommended for further evaluation.  LDL - 70  HgbA1c pending  VTE prophylaxis - SCDs Diet Heart Room service appropriate? Yes; Fluid consistency: Thin  aspirin 325 mg daily prior to admission, now on No antithrombotic secondary to recent TPA therapy.  Patient counseled to be compliant with her  antithrombotic medications  Ongoing aggressive stroke risk factor management  Therapy recommendations:  pending  Disposition:  Pending  Hypertension  Stable  Permissive hypertension (OK if < 220/120) but gradually normalize in 5-7 days  Long-term BP goal normotensive  Hyperlipidemia  Home meds:  Pravachol 40 mg daily not resumed in hospital  LDL 70, goal < 70  Resume Pravachol   Continue statin at discharge   Other Stroke Risk Factors  Overweight, Body mass index is 27.31 kg/m., recommend weight loss, diet and exercise as appropriate   Hx stroke/TIA    Other Active Problems  Abnormal transthoracic echo as noted above  Possible history of PFO   PLAN  Check UA and drug screen. Mobilize out of bed. PT OT consults. Check MRI scan later today  Possible TEE Monday  Hospital day # 1  Mikey Bussing PA-C [facility] Pager [phone] 07/14/2017, 3:24 PM I have personally examined this patient, reviewed notes, independently viewed imaging studies, participated in medical decision making and plan of care.ROS completed by me personally and pertinent positives fully documented  I have made any additions or clarifications directly to the above note. Agree with note  above.  She has presented with aphasia likely do to left MCA branch infarct etiology to be due to mind. Continue close neurological monitoring as per post t-PA protocol and straight blood pressure control. Continue ongoing stroke workup. Long discussion with husband at the bedside and answered questions. This patient is critically ill and at significant risk of neurological worsening, death and care requires constant monitoring of vital signs, hemodynamics,respiratory and cardiac monitoring, extensive review of multiple databases, frequent neurological assessment, discussion with family, other specialists and medical decision making of high complexity.I have made any additions or clarifications  directly to the above note.This critical care time does not reflect procedure time, or teaching time or supervisory time of PA/NP/Med Resident etc but could involve care discussion time.  I spent 32 minutes of neurocritical care time  in the care of  this patient.     Antony Contras, MD Medical Director [facility] Pager: [phone] 07/14/2017 8:32 PM   To contact Stroke Continuity provider, please refer to [email or web address] After hours, contact General Neurology

## 2017-07-14 NOTE — Progress Notes (Signed)
SLP Cancellation Note  Patient Details Name: Stacy Jenkins MRN: [id number] DOB: 09-Oct-1961   Cancelled treatment:       Reason Eval/Treat Not Completed: Patient at procedure or test/unavailable; SLP will continue efforts for completion of cognitive linguistic evaluation   Stacy Chaco MA, CCC-SLP Acute Care Speech Language Pathologist    Levi Aland 07/14/2017, 9:30 AM

## 2017-07-14 NOTE — Progress Notes (Signed)
Patient is up in the bed.

## 2017-07-14 NOTE — Progress Notes (Signed)
PT Cancellation Note  Patient Details Name: Stacy Jenkins MRN: [id number] DOB: 1961/01/03   Cancelled Treatment:    Reason Eval/Treat Not Completed: Patient not medically ready; strict bedrest orders in place after TPA. Will await updated activity orders.   Duncan Dull 07/14/2017, 7:39 AM Alben Deeds, PT DPT [facility] [phone]

## 2017-07-15 ENCOUNTER — Inpatient Hospital Stay (HOSPITAL_COMMUNITY): Payer: Medicare HMO

## 2017-07-15 DIAGNOSIS — I6300 Cerebral infarction due to thrombosis of unspecified precerebral artery: Secondary | ICD-10-CM

## 2017-07-15 DIAGNOSIS — I639 Cerebral infarction, unspecified: Secondary | ICD-10-CM

## 2017-07-15 LAB — BASIC METABOLIC PANEL
Anion gap: 6 (ref 5–15)
BUN: 13 mg/dL (ref 6–20)
CALCIUM: 8.8 mg/dL — AB (ref 8.9–10.3)
CO2: 25 mmol/L (ref 22–32)
CREATININE: 0.75 mg/dL (ref 0.44–1.00)
Chloride: 108 mmol/L (ref 101–111)
GFR calc non Af Amer: >60 mL/min (ref >60)
Glucose, Bld: 114 mg/dL — ABNORMAL HIGH (ref 65–99)
Potassium: 3.7 mmol/L (ref 3.5–5.1)
Sodium: 139 mmol/L (ref 135–145)

## 2017-07-15 LAB — GLUCOSE, CAPILLARY: GLUCOSE-CAPILLARY: 151 mg/dL — AB (ref 65–99)

## 2017-07-15 LAB — CBC
HCT: 37.2 % (ref 36.0–46.0)
Hemoglobin: 12 g/dL (ref 12.0–15.0)
MCH: 29.4 pg (ref 26.0–34.0)
MCHC: 32.3 g/dL (ref 30.0–36.0)
MCV: 91.2 fL (ref 78.0–100.0)
PLATELETS: 162 10*3/uL (ref 150–400)
RBC: 4.08 MIL/uL (ref 3.87–5.11)
RDW: 12.9 % (ref 11.5–15.5)
WBC: 6.9 10*3/uL (ref 4.0–10.5)

## 2017-07-15 MED ORDER — SODIUM CHLORIDE 0.9 % IV BOLUS (SEPSIS)
500.0000 mL | Freq: Once | INTRAVENOUS | Status: AC
  Administered 2017-07-15: 500 mL via INTRAVENOUS

## 2017-07-15 NOTE — Progress Notes (Signed)
PT Cancellation Note  Patient Details Name: Stacy Jenkins MRN: [id number] DOB: [birth year]   Cancelled Treatment:    Reason Eval/Treat Not Completed: Other (comment)    Per order set, continues to be on strict bedrest;   Discussed pt with Romelle Starcher, RN; pt has waxed and waned recently, and Romelle Starcher will ask MD if Bedrest orders can be lifted;   Plan to proceed with PT eval with increased activity orders;   Will follow up later today as time allows;  Otherwise, will follow up for PT tomorrow;   Thank you,  Roney Marion, PT  Acute Rehabilitation Services Pager [phone] Office [phone]     Colletta [place] 07/15/2017, 7:43 AM

## 2017-07-15 NOTE — Progress Notes (Signed)
STROKE TEAM PROGRESS NOTE   HISTORY OF PRESENT ILLNESS (per record) Stacy Jenkins is a 56 y.o. female with a history of previous stroke which caused some difficulty with speech who presented to [facility]. A code stroke was called and she was given IV tpa by tele neurology from [facility].  She did have some improvement with IV tpa but still with persistent aphasia.   LKW: 12:30 pm tpa given?: yes  Code Stroke -> TPA at 1431 on Friday 07/13/2017  PMHX - 3 previous strokes treated at [facility], recent aortic aneurysm repair, "hole in the heart", chronic left internal carotid artery dissection.   SUBJECTIVE (INTERVAL HISTORY) The patient's husband was at the bedside..The patient has  intermittent aphasia and confusion To be done. The patient follows most commands. Her aphasia appears to be improving. MRI scan of the brain shows a left temporal MCA branch embolic infarct OBJECTIVE Temp:  [98.1 F (36.7 C)-99.9 F (37.7 C)] 99.9 F (37.7 C) (07/22 1115) Pulse Rate:  [66-117] 83 (07/22 1000) Cardiac Rhythm: Normal sinus rhythm (07/22 0800) Resp:  [12-21] 18 (07/22 1000) BP: (103-142)/(62-93) 130/74 (07/22 1000) SpO2:  [93 %-100 %] 98 % (07/22 1000)  CBC:   Recent Labs Lab 07/15/17 0158  WBC 6.9  HGB 12.0  HCT 37.2  MCV 91.2  PLT 798    Basic Metabolic Panel:   Recent Labs Lab 07/15/17 0158  NA 139  K 3.7  CL 108  CO2 25  GLUCOSE 114*  BUN 13  CREATININE 0.75  CALCIUM 8.8*    Lipid Panel:     Component Value Date/Time   CHOL 142 07/14/2017 0241   TRIG 94 07/14/2017 0241   HDL 53 07/14/2017 0241   CHOLHDL 2.7 07/14/2017 0241   VLDL 19 07/14/2017 0241   LDLCALC 70 07/14/2017 0241   HgbA1c: No results found for: HGBA1C Urine Drug Screen:     Component Value Date/Time   LABOPIA NONE DETECTED 07/14/2017 0837   COCAINSCRNUR NONE DETECTED 07/14/2017 0837   LABBENZ NONE DETECTED 07/14/2017 0837   AMPHETMU NONE DETECTED 07/14/2017 0837    THCU NONE DETECTED 07/14/2017 0837   LABBARB NONE DETECTED 07/14/2017 0837    Alcohol Level No results found for: ETH  IMAGING   (Multiple studies were performed at [facility])  MRI Head Wo Contrast - pending 07/14/2017  Transthoracic Echocardiogram 07/14/2017 Study Conclusions - Left ventricle: The cavity size was severely dilated. Systolic   function was mildly to moderately reduced. The estimated ejection   fraction was in the range of 40% to 45%. Images were inadequate   for overall LV wall motion assessment but there is akinesis of   the basal and mid inferoseptum and basal and mid anteroseptum   There was an increased relative contribution of atrial   contraction to ventricular filling. Doppler parameters are   consistent with abnormal left ventricular relaxation (grade 1   diastolic dysfunction). - Aortic valve: There was mild regurgitation. Valve area (Vmax):   0.93 cm^2. - Mitral valve: Calcified annulus. Moderate diffuse thickening.   There was trivial regurgitation. - Atrial septum: There was increased thickness of the septum,   consistent with lipomatous hypertrophy. - Impressions: AV not well visualized. Images are suspicious for   bicuspid AV in the parasternal long axis view. In the parasternal   short axis view there is thickening of the annulus. This can   sometimes be seen in annuloar abcess. The valve leaflets cannot   be visualized in  this view. In the apical 5 chamber view there is   suspicion for possible vegetation and prolapse of the the aortic   cusp into the LVOT with moderate eccentric aortic insufficiency.   Recommend TEE for further evaluation. Impressions: - AV not well visualized. Images are suspicious for bicuspid AV in   the parasternal long axis view. In the parasternal short axis   view there is thickening of the annulus. This can sometimes be   seen in annuloar abcess. The valve leaflets cannot be visualized   in this view. In  the apical 5 chamber view there is suspicion for   possible vegetation and prolapse of the the aortic cusp into the   LVOT with moderate eccentric aortic insufficiency.    Recommend TEE for further evaluation.  PHYSICAL EXAM Pleasant middle aged 8 lady not in distress. . Afebrile. Head is nontraumatic. Neck is supple without bruit.    Cardiac exam no murmur or gallop. Lungs are clear to auscultation. Distal pulses are well felt.  Neurological Exam :  Awake alert mild expressive and mostly a receptive aphasia. Speech occasional word finding difficulty and disfluency but can speak  Follows only simple midline and one step commands.extra ocular movements are full range. Fundi were not visualized. Vision acuity seems adequate. Blinks to threat bilaterally. Face is symmetric without weakness. Tongue midline. Motor system exam shows no upper or lower extremity drift. No focal weakness. Deep tendon reflexes are symmetric. Plantars are downgoing. Sensation intact. Gait not tested.    ASSESSMENT/PLAN Stacy Jenkins is a 56 y.o. female with history of 3 previous strokes treated at [facility], recent aortic aneurysm repair," hole in the heart", and a chronic left internal carotid artery dissection presenting with aphasia.   TPA at 1431 on Friday 07/13/2017 at [facility] per telemetry neurologist from [facility].   Left MCA branchStroke ;in a patient with history of prior strokes and mild residual deficits    Resultant  Mild mixed aphasia.  CT head - not performed at [facility] MRI head - 6 cm region of acute infarction at the left posterior temporal/parietal junction. Mild swelling but no hemorrhage or mass effect Appear.Extensive old ischemic changes throughout the brain  MRA head - CTA at [place] venous Dopplers - pending  EEG - pending  Carotid Doppler - CTA at [facility]  2D Echo - EF 40-45%. Suspicion for possible vegetation. TEE recommended  for further evaluation.  LDL - 70  HgbA1c pending  VTE prophylaxis - SCDs Diet Heart Room service appropriate? Yes; Fluid consistency: Thin  aspirin 325 mg daily prior to admission, now on No antithrombotic secondary to recent TPA therapy.  Patient counseled to be compliant with her antithrombotic medications  Ongoing aggressive stroke risk factor management  Therapy recommendations:  pending  Disposition:  Pending  Hypertension  Stable  Permissive hypertension (OK if < 220/120) but gradually normalize in 5-7 days  Long-term BP goal normotensive  Hyperlipidemia  Home meds:  Pravachol 40 mg daily not resumed in hospital  LDL 70, goal < 70  Resume Pravachol   Continue statin at discharge   Other Stroke Risk Factors  Overweight, Body mass index is 27.31 kg/m., recommend weight loss, diet and exercise as appropriate   Hx stroke/TIA    Other Active Problems  Abnormal transthoracic echo as noted above  Possible history of PFO   PLAN   Mobilize out of bed. PT OT consults. Transfer out of ICU to floor bed today .  Possible TEE Monday  Hospital day # 2   I have personally examined this patient, reviewed notes, independently viewed imaging studies, participated in medical decision making and plan of care.ROS completed by me personally and pertinent positives fully documented  I have made any additions or clarifications directly to the above note.  .  She has presented with aphasia likely do to left MCA branch infarct etiology to be due to mind.  Transfer to the floor bed today Continue ongoing stroke workup. Long discussion with husband at the bedside and answered questions. This patient is critically ill and at significant risk of neurological worsening, death and care requires constant monitoring of vital signs, hemodynamics,respiratory and cardiac monitoring, extensive review of multiple databases, frequent neurological assessment, discussion with family, other  specialists and medical decision making of high complexity.I have made any additions or clarifications directly to the above note.This critical care time does not reflect procedure time, or teaching time or supervisory time of PA/NP/Med Resident etc but could involve care discussion time.  I spent 30 minutes of neurocritical care time  in the care of  this patient.     Antony Contras, MD Medical Director [facility] Pager: [phone] 07/15/2017 1:06 PM   To contact Stroke Continuity provider, please refer to [email or web address] After hours, contact General Neurology

## 2017-07-15 NOTE — Progress Notes (Signed)
VASCULAR LAB PRELIMINARY  PRELIMINARY  PRELIMINARY  PRELIMINARY  Bilateral lower extremity venous duplex completed.    Preliminary report:  There is no DVT or SVT noted in the bilateral lower extremities.   Cathy Crounse, RVT 07/15/2017, 4:38 PM

## 2017-07-15 NOTE — Progress Notes (Signed)
PT Cancellation Note  Patient Details Name: Shavy Beachem MRN: [id number] DOB: [birth year]   Cancelled Treatment:    Reason Eval/Treat Not Completed: Other (comment)   Second attempt, pt going off of floor;   Will follow up tomorrow;   Roney Marion, [place] Pager [phone] Office [phone]    Colletta Maryland 07/15/2017, 9:57 AM

## 2017-07-15 NOTE — Progress Notes (Signed)
Patient noted to have increased difficulty answering orientation questions. Able to answer some correctly when given choices. Speech is clear, can speak in sentences, keeps saying she wants to go home. Able to follow commands, no weakness of extremities or vision changes reported by patient. Dr. Leonel Ramsay made aware of difficulty answering questions. Last BP 135/74. 500 cc NS bolus ordered over 30 minutes. Will continue to monitor.

## 2017-07-15 NOTE — Progress Notes (Signed)
EEG completed, results pending. 

## 2017-07-15 NOTE — Progress Notes (Signed)
OT Cancellation Note  Patient Details Name: Stacy Jenkins MRN: [id number] DOB: [birth year]   Cancelled Treatment:    Reason Eval/Treat Not Completed: Patient not medically ready. Pt with strict bedrest orders. Will await increase in activity orders prior to initiating OT evaluation. Thank you for this referral!  Norman Herrlich, MS OTR/L  Pager: [name] 07/15/2017, 6:49 AM

## 2017-07-16 ENCOUNTER — Inpatient Hospital Stay (HOSPITAL_COMMUNITY): Payer: Medicare HMO

## 2017-07-16 ENCOUNTER — Encounter (HOSPITAL_COMMUNITY)
Admission: AD | Disposition: A | Discharge status: Home / Self Care | Payer: Self-pay | Source: Other Acute Inpatient Hospital | Attending: Neurology

## 2017-07-16 ENCOUNTER — Encounter (HOSPITAL_COMMUNITY): Payer: Self-pay

## 2017-07-16 DIAGNOSIS — I351 Nonrheumatic aortic (valve) insufficiency: Secondary | ICD-10-CM

## 2017-07-16 DIAGNOSIS — R4701 Aphasia: Secondary | ICD-10-CM

## 2017-07-16 DIAGNOSIS — I63312 Cerebral infarction due to thrombosis of left middle cerebral artery: Secondary | ICD-10-CM

## 2017-07-16 DIAGNOSIS — I7121 Aneurysm of the ascending aorta, without rupture: Secondary | ICD-10-CM

## 2017-07-16 DIAGNOSIS — I712 Thoracic aortic aneurysm, without rupture: Secondary | ICD-10-CM

## 2017-07-16 HISTORY — PX: TEE WITHOUT CARDIOVERSION: SHX5443

## 2017-07-16 LAB — PROTIME-INR
INR: 1.14
Prothrombin Time: 14.7 seconds (ref 11.4–15.2)

## 2017-07-16 SURGERY — ECHOCARDIOGRAM, TRANSESOPHAGEAL
Anesthesia: Moderate Sedation

## 2017-07-16 MED ORDER — BUTAMBEN-TETRACAINE-BENZOCAINE 2-2-14 % EX AERO
INHALATION_SPRAY | CUTANEOUS | Status: DC | PRN
  Administered 2017-07-16: 22 via TOPICAL

## 2017-07-16 MED ORDER — APIXABAN 5 MG PO TABS
5.0000 mg | ORAL_TABLET | Freq: Two times a day (BID) | ORAL | Status: DC
  Administered 2017-07-16: 5 mg via ORAL
  Filled 2017-07-16: qty 1

## 2017-07-16 MED ORDER — MIDAZOLAM HCL 5 MG/ML IJ SOLN
INTRAMUSCULAR | Status: AC
  Filled 2017-07-16: qty 2

## 2017-07-16 MED ORDER — SODIUM CHLORIDE 0.9 % IV SOLN
INTRAVENOUS | Status: DC
  Administered 2017-07-16: 13:00:00 via INTRAVENOUS

## 2017-07-16 MED ORDER — APIXABAN 5 MG PO TABS: 5.0000 mg | ORAL_TABLET | Freq: Two times a day (BID) | ORAL | 0 refills | Status: DC

## 2017-07-16 MED ORDER — FENTANYL CITRATE (PF) 100 MCG/2ML IJ SOLN
INTRAMUSCULAR | Status: DC | PRN
  Administered 2017-07-16 (×2): 25 ug via INTRAVENOUS

## 2017-07-16 MED ORDER — FENTANYL CITRATE (PF) 100 MCG/2ML IJ SOLN
INTRAMUSCULAR | Status: AC
  Filled 2017-07-16: qty 2

## 2017-07-16 MED ORDER — MIDAZOLAM HCL 10 MG/2ML IJ SOLN
INTRAMUSCULAR | Status: DC | PRN
  Administered 2017-07-16: 2 mg via INTRAVENOUS
  Administered 2017-07-16: 1 mg via INTRAVENOUS
  Administered 2017-07-16: 2 mg via INTRAVENOUS

## 2017-07-16 NOTE — Care Management Important Message (Signed)
Important Message  Patient Details  Name: Stacy Jenkins MRN: [id number] Date of Birth: [birth year]   Medicare Important Message Given:  Yes    [name] [place] 07/16/2017, 9:11 AM

## 2017-07-16 NOTE — Evaluation (Signed)
Speech Language Pathology Evaluation Patient Details Name: Saroya Riccobono MRN: [id number] DOB: May 21, 1961 Today's Date: 07/16/2017 Time:  -     Problem List:  Patient Active Problem List   Diagnosis Date Noted  . Stroke (cerebrum) ([place]) 07/13/2017   Past Medical History:  Past Medical History:  Diagnosis Date  . Stroke [facility])    Past Surgical History: History reviewed. No pertinent surgical history. 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 Pt is fluent, able to repeat short phrases and sentences and read sentences fluently with decreased comprehension. She is less than 25% accurate in pointing to body parts (mostly open group), but better than 75% accurate in identifying objects in a group of 5 (closed group). After cueing and teaching, pt could consistently identify and name three objects, but not follow auditory commands with simple verbs and prepositions using the objects (put x under y, put y between x and z), but was much more successful with written instruction. Overall pt struggles with auditory comprehension deficits and phonemic and semantic paraphasias but benefits from visual and written cues. Pt is an excellent candidate for intensive, frequent speech therapy that would best be provided in an outpatient setting given lack of physical impairment. Discussed basic strategies with pts husband and plan of care. Will follow acutely for further interventions.     SLP Assessment  SLP Recommendation/Assessment: Patient needs continued Speech Lanaguage Pathology Services SLP Visit Diagnosis: Aphasia (R47.01)    Follow Up Recommendations  Outpatient SLP    Frequency and Duration min 3x week  2 weeks      SLP Evaluation Cognition  Overall Cognitive Status: Within Functional Limits for tasks assessed Orientation Level: Oriented to  person;Oriented to place;Oriented to situation;Oriented to time Attention: Alternating Alternating Attention: Appears intact Memory: Appears intact Awareness: Impaired Awareness Impairment: Intellectual impairment;Emergent impairment;Anticipatory impairment       Comprehension  Auditory Comprehension Overall Auditory Comprehension: Impaired Yes/No Questions: Not tested Commands: Impaired One Step Basic Commands: 0-24% accurate Multistep Basic Commands: 0-24% accurate Conversation: Simple (impaired even with simple conversation) Interfering Components: Working memory EffectiveTechniques: Visual/Gestural cues (written cues) Reading Comprehension Reading Status: Impaired (able to read fluently, needs cues for comprehension)    Expression Expression Primary Mode of Expression: Verbal Verbal Expression Overall Verbal Expression: Impaired Initiation: No impairment Automatic Speech: Name;Social Response Level of Generative/Spontaneous Verbalization: Phrase Repetition: Impaired Level of Impairment: Sentence level Naming: Impairment Responsive: Not tested Confrontation: Impaired Convergent: Not tested Divergent: Not tested Verbal Errors: Semantic paraphasias;Phonemic paraphasias;Aware of errors Pragmatics: No impairment Interfering Components: Premorbid deficit Effective Techniques: Written cues Written Expression Dominant Hand: Right Written Expression: Not tested   Oral / Motor  Oral Motor/Sensory Function Overall Oral Motor/Sensory Function: Within functional limits Motor Speech Overall Motor Speech: Appears within functional limits for tasks assessed   GO                   Herbie Baltimore, MA CCC-SLP [phone]  Lynann Beaver 07/16/2017, 1:22 PM

## 2017-07-16 NOTE — Progress Notes (Signed)
Notified by CCMD that pt had a short burst of SVT w/HR 150's. Per Shift report Pt had a short burst earlier today as well. Pt asymptomatic/Strip saved/ VS stable. Will continur to monitor. Jessie Foot, RN

## 2017-07-16 NOTE — Op Note (Signed)
INDICATIONS: aortic root disease, stroke  PROCEDURE:   Informed consent was obtained prior to the procedure. The risks, benefits and alternatives for the procedure were discussed and the patient comprehended these risks.  Risks include, but are not limited to, cough, sore throat, vomiting, nausea, somnolence, esophageal and stomach trauma or perforation, bleeding, low blood pressure, aspiration, pneumonia, infection, trauma to the teeth and death.    After a procedural time-out, the oropharynx was anesthetized with 20% benzocaine spray.   During this procedure the patient was administered a total of Versed 5 mg and Fentanyl 50 mcg to achieve and maintain moderate conscious sedation.  The patient's heart rate, blood pressure, and oxygen saturationweare monitored continuously during the procedure. The period of conscious sedation was 12 minutes, of which I was present face-to-face 100% of this time.  The transesophageal probe was inserted in the esophagus and stomach without difficulty and multiple views were obtained.  The patient was kept under observation until the patient left the procedure room.  The patient left the procedure room in stable condition.   Agitated microbubble saline contrast was not administered.  COMPLICATIONS:    There were no immediate complications.  FINDINGS:  Mildly dilated LV.  Mild-moderate reduction in LV systolic function, EF 36% with regional abnormalities. 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 Without old images, difficult to distinguish prom a periannular abscess. The arch and descending aorta appear normal in caliber. Aortic valve (looks like resuspended native valve) is trileaflet, but prolapses in LVOT and appears to have at least one torn/perforated leaflet. There is at least moderate, probably moderate to severe aortic insufficiency. No vegetations are seen. 2+ MR, central. 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 No intracardiac thrombi.  RECOMMENDATIONS:    Comparison with older studies would be invaluable.  Recent echo 05/15/2017 was performed at [facility], but neither the images nor the report can be retrieved from [place].  Time Spent Directly with the Patient:  30 minutes   Stacy Jenkins 07/16/2017, 2:04 PM

## 2017-07-16 NOTE — Discharge Summary (Signed)
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 Stroke [facility])    History reviewed. No pertinent surgical history.  Allergies as of 07/16/2017      Reactions   Penicillin G Rash      Medication List    STOP taking these medications   aspirin 325 MG tablet     TAKE these medications   apixaban 5 MG Tabs tablet Commonly known as:  ELIQUIS Take 1 tablet (5 mg total) by mouth 2 (two) times daily.   carvedilol 3.125 MG tablet Commonly known as:  COREG Take 3.125 mg by mouth 2 (two) times daily with a meal.   cholecalciferol 1000 units tablet Commonly known as:  VITAMIN D Take 1,000 Units by mouth daily.   lisinopril 5 MG tablet Commonly known as:  PRINIVIL,ZESTRIL Take 5 mg by mouth daily.   multivitamin capsule Take by mouth.   pravastatin 40 MG tablet Commonly known as:  PRAVACHOL Take by mouth.       LABORATORY STUDIES CBC    Component Value Date/Time   WBC 6.9 07/15/2017 0158   RBC 4.08 07/15/2017 0158   HGB 12.0 07/15/2017 0158   HCT 37.2 07/15/2017 0158   PLT 162 07/15/2017 0158   MCV 91.2 07/15/2017 0158   MCH 29.4 07/15/2017 0158   MCHC 32.3 07/15/2017 0158   RDW 12.9 07/15/2017 0158   CMP    Component Value Date/Time   NA 139 07/15/2017 0158   K  3.7 07/15/2017 0158   CL 108 07/15/2017 0158   CO2 25 07/15/2017 0158   GLUCOSE 114 (H) 07/15/2017 0158   BUN 13 07/15/2017 0158   CREATININE 0.75 07/15/2017 0158   CALCIUM 8.8 (L) 07/15/2017 0158   GFRNONAA >60 07/15/2017 0158   GFRAA >60 07/15/2017 0158   COAGS Lab Results  Component Value Date   INR 1.14 07/16/2017   Lipid Panel    Component Value Date/Time   CHOL 142 07/14/2017 0241   TRIG 94 07/14/2017 0241   HDL 53 07/14/2017 0241   CHOLHDL 2.7 07/14/2017 0241   VLDL 19 07/14/2017 0241   LDLCALC 70 07/14/2017 0241   HgbA1C No results found for: HGBA1C Urinalysis    Component Value Date/Time   COLORURINE STRAW (A) 07/14/2017 1627   APPEARANCEUR CLEAR 07/14/2017 1627   LABSPEC 1.010 07/14/2017 1627   PHURINE 6.0 07/14/2017 1627   GLUCOSEU NEGATIVE 07/14/2017 1627   HGBUR MODERATE (A) 07/14/2017 1627   BILIRUBINUR NEGATIVE 07/14/2017 1627   KETONESUR NEGATIVE 07/14/2017 1627   PROTEINUR NEGATIVE 07/14/2017 1627   NITRITE NEGATIVE 07/14/2017 1627   LEUKOCYTESUR NEGATIVE 07/14/2017 1627   Urine Drug Screen     Component Value Date/Time   LABOPIA NONE DETECTED 07/14/2017 0837   COCAINSCRNUR NONE DETECTED  07/14/2017 [place] DETECTED 07/14/2017 [place] DETECTED 07/14/2017 [place] DETECTED 07/14/2017 0837   LABBARB NONE DETECTED 07/14/2017 0837    Alcohol Level No results found for: [facility]   SIGNIFICANT DIAGNOSTIC STUDIES (Multiple studies were performed at [facility])  MRI Head Wo Contrast - pending 07/14/2017  Transthoracic Echocardiogram 07/14/2017 Study Conclusions - Left ventricle: The cavity size was severely dilated. Systolic function was mildly to moderately reduced. The estimated ejection fraction was in the range of 40% to 45%. Images were inadequate for overall LV wall motion assessment but there is akinesis of the basal and mid inferoseptum and basal and mid anteroseptum There was an  increased relative contribution of atrial contraction to ventricular filling. Doppler parameters are consistent with abnormal left ventricular relaxation (grade 1 diastolic dysfunction). - Aortic valve: There was mild regurgitation. Valve area (Vmax): 0.93 cm^2. - Mitral valve: Calcified annulus. Moderate diffuse thickening. There was trivial regurgitation. - Atrial septum: There was increased thickness of the septum, consistent with lipomatous hypertrophy. - Impressions: AV not well visualized. Images are suspicious for bicuspid AV in the parasternal long axis view. In the parasternal short axis view there is thickening of the annulus. This can sometimes be seen in annuloar abcess. The valve leaflets cannot be visualized in this view. In the apical 5 chamber view there is suspicion for possible vegetation and prolapse of the the aortic cusp into the LVOT with moderate eccentric aortic insufficiency. Recommend TEE for further evaluation. Impressions: - AV not well visualized. Images are suspicious for bicuspid AV in the parasternal long axis view. In the parasternal short axis view there is thickening of the annulus. This can sometimes be seen in annuloar abcess. The valve leaflets cannot be visualized in this view. In the apical 5 chamber view there is suspicion for possible vegetation and prolapse of the the aortic cusp into the LVOT with moderate eccentric aortic insufficiency.    Recommend TEE for further evaluation.   HISTORY OF PRESENT ILLNESS Faye Phillipsis a 56 y.o.femalewith a history of previous stroke which caused some difficulty with speech who presented to [facility]. A code stroke was called and she was given IV tpa by tele neurology from [facility].  She did have some improvement with IV tpa but still with persistent aphasia.   LKW: 12:30 pm tpa given?: yes  Code Stroke -> TPA at 1431 on Friday  07/13/2017  PMHX - 3 previous strokes treated at [facility], recent aortic aneurysm repair, "hole in the heart", chronic left internal carotid artery dissection.   HOSPITAL COURSE Ms. Taressa Rauh is a 56 y.o. female with history of 3 previous strokes treated at [facility], recent aortic aneurysm repair," hole in the heart", and a chronic left internal carotid artery dissection presenting with aphasia.   TPA at 1431 on Friday 07/13/2017 at [facility] per telemetry neurologist from [facility]. She was admitted to the neurological intensive care unit and blood pressure was tightly controlled as per post TPA protocol. Patient showed neurological improvement she did have some mild aphasia which gradually improved and on the day of discharge she had occasional word finding difficulties and some trouble understanding but was getting better. She was seen by physical occupational therapy and felt not to need inpatient therapy. She had transesophageal echocardiogram which did not show an obvious cardiac source of embolism but did show moderate to severe aortic regurgitation but it was difficult to compare this with her  previous echocardiogram done in [place] is actual films are not available. She is advised to follow-up with her cardiologist in [facility] Dr. Remer Macho for further cardiac medication changes if necessary. She was also found to have a small atrial septal defect which was mostly showing left-to-right shunting and likely not related to her stroke. Since the patient's stroke was clearly embolic and she did have a history of transient paroxysmal atrial fibrillation following her dissecting aortic aneurysm repair it was felt that she did not need long-term cardiac monitoring. to demonstrated A. fib and she was changed from aspirin to eliquis for secondary stroke prevention. Her CHAD2 Vasc score was 6 ( 13 % risk of stroke/TIA per year)  Left MCA branchStroke ;in a  patient with history of prior strokes and mild residual deficits    Resultant  Mild mixed aphasia.  CT head - not performed at [facility] MRI head - 6 cm region of acute infarction at the left posterior temporal/parietal junction. Mild swelling but no hemorrhage or mass effect Appear.Extensive old ischemic changes throughout the brain  MRA head - CTA at [place] venous Dopplers - not done  EEG - focal left temporoparietal slowing but no seizure activity.  Carotid Doppler - CTA at [facility]  2D Echo - EF 40-45%. Suspicion for possible vegetation. TEE recommended for further evaluation.  LDL - 70  HgbA1c pending  VTE prophylaxis - SCDs  Diet Heart Room service appropriate? Yes; Fluid consistency: Thin  aspirin 325 mg daily prior to admission, now on No antithrombotic secondary to recent TPA therapy.  Patient counseled to be compliant with her antithrombotic medications  Ongoing aggressive stroke risk factor management  Therapy recommendations: None Disposition:   home  Hypertension  Stable              Permissive hypertension (OK if < 220/120) but gradually normalize in 5-7 days              Long-term BP goal normotensive  Hyperlipidemia  Home meds:  Pravachol 40 mg daily not resumed in hospital  LDL 70, goal < 70  Resume Pravachol   Continue statin at discharge  Other Stroke Risk Factors  Overweight, Body mass index is 27.31 kg/m., recommend weight loss, diet and exercise as appropriate   Hx stroke/TIA  Other Active Problems  Abnormal transthoracic echo as noted above  Possible history of PFO   DISCHARGE EXAM Blood pressure 126/67, pulse 86, temperature 98.6 F (37 C), temperature source Oral, resp. rate 16, height 5\' 7"  (1.702 m), weight 77.1 kg (170 lb), SpO2 96 %. PHYSICAL EXAM Pleasant middle aged 58 lady not in distress.  Afebrile. Head is nontraumatic. Neck is supple without bruit.    Cardiac exam no murmur or  gallop. Lungs are clear to auscultation. Distal pulses are well felt.  Neurological Exam:  Awake alert mild expressive and mostly a receptive aphasia. Speech occasional word finding difficulty and disfluency but can speak  Follows only simple midline and one step commands.extra ocular movements are full range. Fundi were not visualized. Vision acuity seems adequate. Blinks to threat bilaterally. Face is symmetric without weakness. Tongue midline. Motor system exam shows no upper or lower extremity drift. No focal weakness. Deep tendon reflexes are symmetric. Plantars are downgoing. Sensation intact. Gait not tested.  Discharge Diet     liquids   DISCHARGE PLAN  Disposition: Discharge to home/self-care  Eliquis (apixaban) daily for secondary stroke prevention. Given h/o AFIB  Ongoing risk factor control  by Primary Care Physician at time of discharge  F/w with cardiologist Dr Ailene Ards in [place] in 2 weeks  Follow-up with Antony Contras, Stroke Clinic in 6 weeks, office to schedule an appointment.  Outpatient speech therapy for language  45 minutes were spent preparing discharge.  I have personally examined this patient, reviewed notes, independently viewed imaging studies, participated in medical decision making and plan of care.ROS completed by me personally and pertinent positives fully documented  I have made any additions or clarifications directly to the above note.   Antony Contras, MD Medical Director [facility] Pager: [phone] 07/16/2017 4:05 PM

## 2017-07-16 NOTE — Discharge Instructions (Signed)
Information on my medicine - ELIQUIS® (apixaban) ° °This medication education was reviewed with me or my healthcare representative as part of my discharge preparation.  The pharmacist that spoke with me during my hospital stay was:  Shaniqwa Horsman Rhea, RPH ° °Why was Eliquis® prescribed for you? °Eliquis® was prescribed for you to reduce the risk of a blood clot forming that can cause a stroke if you have a medical condition called atrial fibrillation (a type of irregular heartbeat). ° °What do You need to know about Eliquis® ? °Take your Eliquis® TWICE DAILY - one tablet in the morning and one tablet in the evening with or without food. If you have difficulty swallowing the tablet whole please discuss with your pharmacist how to take the medication safely. ° °Take Eliquis® exactly as prescribed by your doctor and DO NOT stop taking Eliquis® without talking to the doctor who prescribed the medication.  Stopping may increase your risk of developing a stroke.  Refill your prescription before you run out. ° °After discharge, you should have regular check-up appointments with your healthcare provider that is prescribing your Eliquis®.  In the future your dose may need to be changed if your kidney function or weight changes by a significant amount or as you get older. ° °What do you do if you miss a dose? °If you miss a dose, take it as soon as you remember on the same day and resume taking twice daily.  Do not take more than one dose of ELIQUIS at the same time to make up a missed dose. ° °Important Safety Information °A possible side effect of Eliquis® is bleeding. You should call your healthcare provider right away if you experience any of the following: °  Bleeding from an injury or your nose that does not stop. °  Unusual colored urine (red or dark brown) or unusual colored stools (red or black). °  Unusual bruising for unknown reasons. °  A serious fall or if you hit your head (even if there is no  bleeding). ° °Some medicines may interact with Eliquis® and might increase your risk of bleeding or clotting while on Eliquis®. To help avoid this, consult your healthcare provider or pharmacist prior to using any new prescription or non-prescription medications, including herbals, vitamins, non-steroidal anti-inflammatory drugs (NSAIDs) and supplements. ° °This website has more information on Eliquis® (apixaban): http://www.eliquis.com/eliquis/home ° °

## 2017-07-16 NOTE — Progress Notes (Signed)
    [facility] has been requested to perform a transesophageal echocardiogram on Stacy Jenkins for CVA in the setting of known PFO.  After careful review of history and examination, the risks and benefits of transesophageal echocardiogram have been explained including risks of esophageal damage, perforation (1:10,000 risk), bleeding, pharyngeal hematoma as well as other potential complications associated with conscious sedation including aspiration, arrhythmia, respiratory failure and death. Alternatives to treatment were discussed, questions were answered. Her husband was also present. Patient is willing to proceed.   Lyda Jester, PA-C  07/16/2017 10:18 AM

## 2017-07-16 NOTE — Procedures (Signed)
ELECTROENCEPHALOGRAM REPORT  Date of Study: 07/15/2017 (assigned to this provider on 07/16/2017)  Patient's Name: Stacy Jenkins MRN: [id number] Date of Birth: 15-May-1961  Referring Provider: Mikey Bussing, PA-C  Clinical History: This is a 56 year old woman with speech difficulties.  Medications: acetaminophen (TYLENOL) tablet 650 mg  aspirin chewable tablet 81 mg  pantoprazole (PROTONIX) EC tablet 40 mg  pravastatin (PRAVACHOL) tablet 40 mg   Technical Summary: A multichannel digital EEG recording measured by the international 10-20 system with electrodes applied with paste and impedances below 5000 ohms performed as portable with EKG monitoring in an awake and drowsy patient.  Hyperventilation and photic stimulation were not performed.  The digital EEG was referentially recorded, reformatted, and digitally filtered in a variety of bipolar and referential montages for optimal display.   Description: The patient is awake and drowsy during the recording.  During maximal wakefulness, there is a medium voltage 8.5-9 Hz posterior dominant rhythm better formed over the right occipital region that attenuates with eye opening. This is admixed with a small amount of diffuse 4-5 Hz theta slowing of the waking background.  There is additional focal theta and delta slowing over the left frontotemporal region. During drowsiness, there is an increase in theta and delta slowing of the background.  Deeper stages of sleep were not seen. Hyperventilation and photic stimulation were not performed.  There were no epileptiform discharges or electrographic seizures seen.    EKG lead was unremarkable.  Impression: This awake and asleep EEG is abnormal due to the presence of: 1. Mild diffuse background slowing 2. Focal slowing over the left frontotemporal region.  Clinical Correlation of the above findings indicates diffuse cerebral dysfunction that is non-specific in etiology and can be seen with  hypoxic/ischemic injury, toxic/metabolic encephalopathies, neurodegenerative disorders, or medication effect. Focal slowing over the left frontotemporal region indicates focal cerebral dysfunction suggestive of underlying structural or physiologic abnormality. The absence of epileptiform discharges does not exclude a clinical diagnosis of epilepsy. Clinical correlation is advised.    Ellouise Newer, M.D.

## 2017-07-16 NOTE — Evaluation (Signed)
Physical Therapy Evaluation Patient Details Name: Stacy Jenkins MRN: [id number] DOB: 05-28-1961 Today's Date: 07/16/2017   History of Present Illness  Stacy Jenkins is a 56 y.o. female with a history of previous stroke which caused some difficulty with speech who presented to [facility] with confusion and worsening aphasia. A code stroke was called and she was given IV tpa by teleneurology.  MRI showed L temporal MCA branch embolic infarct.Marland Kitchen  PMH includes recent aortic aneurysm repair, "hole in the heart", chronic left internal carotid artery dissection, pes planus  Clinical Impression  Pt admitted with above diagnosis. Pt currently with functional limitations due to the deficits listed below (see PT Problem List).  Pt will benefit from skilled PT to increase their independence and safety with mobility to allow discharge to the venue listed below.  Pt moving close to baseline, but a little unsteady due to lack of activity.  Will follow acutely, but no follow up PT needed. Pt and husband in agreement.     Follow Up Recommendations No PT follow up;Supervision - Intermittent    Equipment Recommendations  None recommended by PT    Recommendations for Other Services       Precautions / Restrictions Precautions Precautions: None Precaution Comments: wears shoes with orthotics Restrictions Weight Bearing Restrictions: No      Mobility  Bed Mobility Overal bed mobility: Modified Independent                Transfers Overall transfer level: Modified independent                  Ambulation/Gait Ambulation/Gait assistance: Min guard Ambulation Distance (Feet): 325 Feet Assistive device: None Gait Pattern/deviations: Step-through pattern;Decreased step length - right     General Gait Details: Pt with altered gait pattern with decreased step length, but pt and husband report it is close to her baseline.  Pt did wear her shoes with orthotic  inserts.  Stairs Stairs: Yes Stairs assistance: Min guard;Supervision Stair Management: One rail Left;Step to pattern;Forwards Number of Stairs: 3 General stair comments: Step to pattern with good clearance of step height  Wheelchair Mobility    Modified Rankin (Stroke Patients Only) Modified Rankin (Stroke Patients Only) Pre-Morbid Rankin Score: Slight disability Modified Rankin: Slight disability     Balance Overall balance assessment: Needs assistance   Sitting balance-Leahy Scale: Good       Standing balance-Leahy Scale: Fair                               Pertinent Vitals/Pain Pain Assessment: No/denies pain    Home Living Family/patient expects to be discharged to:: Private residence Living Arrangements: Spouse/significant other     Home Access: Stairs to enter Entrance Stairs-Rails: Left Entrance Stairs-Number of Steps: 3 Home Layout: One level;Laundry or work area in [employer]: None      Prior Function Level of Independence: Independent         Comments: walks with husband daily and swims     Hand Dominance        Extremity/Trunk Assessment   Upper Extremity Assessment Upper Extremity Assessment: Defer to OT evaluation    Lower Extremity Assessment Lower Extremity Assessment: Overall WFL for tasks assessed;Generalized weakness (pes planus, and hip flexors 3+/5)    Cervical / Trunk Assessment Cervical / Trunk Assessment: Normal  Communication   Communication: Expressive difficulties  Cognition Arousal/Alertness: Awake/alert Behavior During Therapy: WFL for tasks assessed/performed  Overall Cognitive Status: Difficult to assess                                        General Comments      Exercises     Assessment/Plan    PT Assessment Patient needs continued PT services  PT Problem List Decreased activity tolerance;Decreased strength;Decreased balance;Decreased mobility       PT  Treatment Interventions DME instruction;Gait training;Functional mobility training;Therapeutic activities;Therapeutic exercise;Stair training    PT Goals (Current goals can be found in the Care Plan section)  Acute Rehab PT Goals Patient Stated Goal: go home PT Goal Formulation: With patient/family Time For Goal Achievement: 07/23/17 Potential to Achieve Goals: Good    Frequency Min 3X/week   Barriers to discharge        Co-evaluation               AM-PAC PT "6 Clicks" Daily Activity  Outcome Measure Difficulty turning over in bed (including adjusting bedclothes, sheets and blankets)?: None Difficulty moving from lying on back to sitting on the side of the bed? : None Difficulty sitting down on and standing up from a chair with arms (e.g., wheelchair, bedside commode, etc,.)?: None Help needed moving to and from a bed to chair (including a wheelchair)?: A Little Help needed walking in hospital room?: A Little Help needed climbing 3-5 steps with a railing? : A Little 6 Click Score: 21    End of Session Equipment Utilized During Treatment: Gait belt Activity Tolerance: Patient tolerated treatment well Patient left: in chair;with call bell/phone within reach;with family/visitor present Nurse Communication: Mobility status PT Visit Diagnosis: Difficulty in walking, not elsewhere classified (R26.2)    Time: 3578-9784 PT Time Calculation (min) (ACUTE ONLY): 27 min   Charges:   PT Evaluation $PT Eval Low Complexity: 1 Procedure PT Treatments $Gait Training: 8-22 mins   PT G Codes:        Baltazar Pekala L. Tamala Julian, [place] Pager [phone] 07/16/2017   Stacy Jenkins 07/16/2017, 10:07 AM

## 2017-07-16 NOTE — Evaluation (Signed)
Occupational Therapy Evaluation Patient Details Name: Stacy Jenkins MRN: [id number] DOB: [birth year] Today's Date: 07/16/2017    History of Present Illness Stacy Jenkins is a 56 y.o. female with a history of previous stroke which caused some difficulty with speech who presented to [facility] with confusion and worsening aphasia. A code stroke was called and she was given IV tpa by teleneurology.  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 She will continue to benefit from acute OT without need for follow up.    Follow Up Recommendations  No OT follow up;Other (comment) (recommended 24 hour S initially to make sure pt gets back into her normal routine and husband feels she is good to be by herself)    Equipment Recommendations  None recommended by OT       Precautions / Restrictions Precautions Precautions: None Precaution Comments: wears shoes with orthotics Restrictions Weight Bearing Restrictions: No      Mobility Bed Mobility Overal bed mobility: Modified Independent                Transfers Overall transfer level: Modified independent                    Balance Overall balance assessment: Needs assistance   Sitting balance-Leahy Scale: Good       Standing balance-Leahy Scale: Fair                             ADL either performed or assessed with clinical judgement   ADL Overall ADL's : Needs assistance/impaired Eating/Feeding: Independent;Sitting   Grooming: Set up;Standing;Supervision/safety   Upper Body Bathing: Set up;Sitting   Lower Body Bathing: Set up;Supervison/  safety;Sit to/from stand   Upper Body Dressing : Set up;Sitting   Lower Body Dressing: Set up;Supervision/safety;Sit to/from stand   Toilet Transfer: Min guard;Ambulation;Regular Toilet;Grab bars   Toileting- Clothing Manipulation and Hygiene: Supervision/safety;Sit to/from stand         General ADL Comments: Pt has shoes with orthotic inserts     Vision   Additional Comments: Needs to be further assessed            Pertinent Vitals/Pain Pain Assessment: No/denies pain     Hand Dominance Right   Extremity/Trunk Assessment Upper Extremity Assessment Upper Extremity Assessment: Overall WFL for tasks assessed     Communication Communication Communication: Expressive difficulties   Cognition Arousal/Alertness: Awake/alert Behavior During Therapy: WFL for tasks assessed/performed Overall Cognitive Status: Difficult to assess                                 General Comments: Pt was able to show me the steps she would take to brush her teeth, she combed her hair at the sink, she was able to tell me that the soap dispenser was not working in her room when she went to wash her hands              Home Living Family/patient expects to be discharged to:: Private residence Living Arrangements: Spouse/significant other Available Help  at Discharge: Family;Available PRN/intermittently Type of Home: House Home Access: Stairs to enter [employer] of Steps: 3 Entrance Stairs-Rails: Left Home Layout: One level;Laundry or work area in basement     [employer]: Medical sales representative: None          Prior Functioning/Environment Level of Independence: Independent        Comments: walks with husband daily and swims; does not do any cooking(husband does) she does do the other housework        OT Problem List: Impaired balance (sitting and/or standing)      OT Treatment/Interventions:  Self-care/ADL training;Patient/family education;Balance training    OT Goals(Current goals can be found in the care plan section) Acute Rehab OT Goals Patient Stated Goal: go home OT Goal Formulation: With patient Time For Goal Achievement: 07/23/17 Potential to Achieve Goals: Good  OT Frequency: Min 2X/week              AM-PAC PT "6 Clicks" Daily Activity     Outcome Measure Help from another person eating meals?: None Help from another person taking care of personal grooming?: None Help from another person toileting, which includes using toliet, bedpan, or urinal?: None Help from another person bathing (including washing, rinsing, drying)?: None Help from another person to put on and taking off regular upper body clothing?: None Help from another person to put on and taking off regular lower body clothing?: None 6 Click Score: 24   End of Session Equipment Utilized During Treatment: Gait belt  Activity Tolerance: Patient tolerated treatment well Patient left: in chair;with call bell/phone within reach  OT Visit Diagnosis: Unsteadiness on feet (R26.81)                Time: 9163-8466 OT Time Calculation (min): 14 min Charges:  OT General Charges $OT Visit: 1 Procedure OT Evaluation $OT Eval Moderate Complexity: 1 Procedure Golden Circle, OTR/L [phone] 07/16/2017

## 2017-07-16 NOTE — Progress Notes (Signed)
Eliquis packet and discharge paperwork given to pt. Teach back of discharge instructions given and pt verbalizes understanding. NT to take pt to private vehicle with family. Pt stable at time of discharge Blood pressure 116/73, pulse 80, temperature 98.6 F (37 C), temperature source Oral, resp. rate 19, height 5\' 7"  (1.702 m), weight 77.1 kg (170 lb), SpO2 95 %.

## 2017-07-18 LAB — HEMOGLOBIN A1C
Hgb A1c MFr Bld: 5.5 % (ref 4.8–5.6)
Mean Plasma Glucose: 111 mg/dL

## 2017-07-19 ENCOUNTER — Other Ambulatory Visit: Payer: Self-pay

## 2017-07-19 ENCOUNTER — Telehealth: Payer: Self-pay | Admitting: Neurology

## 2017-07-19 ENCOUNTER — Other Ambulatory Visit: Payer: Self-pay | Admitting: Neurology

## 2017-07-19 ENCOUNTER — Ambulatory Visit (INDEPENDENT_AMBULATORY_CARE_PROVIDER_SITE_OTHER): Payer: Medicare HMO | Admitting: Sports Medicine

## 2017-07-19 ENCOUNTER — Encounter (HOSPITAL_COMMUNITY): Payer: Self-pay | Admitting: Cardiovascular Disease

## 2017-07-19 DIAGNOSIS — M2170 Unequal limb length (acquired), unspecified site: Secondary | ICD-10-CM

## 2017-07-19 DIAGNOSIS — M2141 Flat foot [pes planus] (acquired), right foot: Secondary | ICD-10-CM | POA: Diagnosis not present

## 2017-07-19 DIAGNOSIS — R52 Pain, unspecified: Secondary | ICD-10-CM | POA: Diagnosis not present

## 2017-07-19 DIAGNOSIS — M2142 Flat foot [pes planus] (acquired), left foot: Secondary | ICD-10-CM

## 2017-07-19 DIAGNOSIS — I6300 Cerebral infarction due to thrombosis of unspecified precerebral artery: Secondary | ICD-10-CM

## 2017-07-19 DIAGNOSIS — R269 Unspecified abnormalities of gait and mobility: Secondary | ICD-10-CM

## 2017-07-19 DIAGNOSIS — M722 Plantar fascial fibromatosis: Secondary | ICD-10-CM

## 2017-07-19 NOTE — Telephone Encounter (Signed)
Called and LVM for husband to call back. Let him know Dr Leonie Man placed referral for speech therapy. Someone should call to scheduled them within the next 1-2 weeks.

## 2017-07-19 NOTE — Telephone Encounter (Signed)
Noted, thank you

## 2017-07-19 NOTE — Telephone Encounter (Signed)
Ok done

## 2017-07-19 NOTE — Telephone Encounter (Signed)
This patient had a stroke and Dr Leonie Man saw them in the hospital. The husband is calling and wants to get her into speech therapy. He wants a call back as to how he can do this. Please call Herbie [place]. dg

## 2017-07-19 NOTE — Telephone Encounter (Addendum)
Pt's husband called back. He requested referral be sent to Rehab @ [facility]. The call was transferred to [place].

## 2017-07-19 NOTE — Patient Outreach (Signed)
  [facility]) Care Management  07/19/2017  Stacy Jenkins 08/11/1961 [id number]   EMMI: stroke Referral date: 07/19/17 Referral source:EMMI stroke red alert Referral reason: Scheduled follow up appointment: NO Day # 1  Telephone call to patient regarding EMMI stroke red alert. Unable to reach patient. HIPAA compliant voice message left with call back phone number.   PLAN: RNCM will attempt 2nd telephone call to patient within 3 business days.   Quinn Plowman RN,BSN,CCM [facility] Telephonic  [phone]

## 2017-07-19 NOTE — Progress Notes (Signed)
Subjective: Stacy Jenkins is a 55 y.o. female patient who returns to office for follow-up evaluation and orthotic check. Patient reports that she is doing fine and has no pain, however, has had recent heart issues that she wants to get better. Patient denies any other pedal complaints.   Patient Active Problem List   Diagnosis Date Noted  . Nonrheumatic aortic valve insufficiency   . Ascending aortic aneurysm ([place])   . Aphasia   . Stroke (cerebrum) ([place]) - Left MCA branchStroke in a patient with history of prior strokes and mild residual deficits, s/p tPA 07/13/2017   Current Outpatient Prescriptions on File Prior to Visit  Medication Sig Dispense Refill  . apixaban (ELIQUIS) 5 MG TABS tablet Take 1 tablet (5 mg total) by mouth 2 (two) times daily. 60 tablet 0  . carvedilol (COREG) 3.125 MG tablet Take 3.125 mg by mouth 2 (two) times daily with a meal.     . cholecalciferol (VITAMIN D) 1000 units tablet Take 1,000 Units by mouth daily.    Marland Kitchen lisinopril (PRINIVIL,ZESTRIL) 5 MG tablet Take 5 mg by mouth daily.     . Multiple Vitamin (MULTIVITAMIN) capsule Take by mouth.    . pravastatin (PRAVACHOL) 40 MG tablet Take by mouth.     No current facility-administered medications on file prior to visit.    Allergies  Allergen Reactions  . Penicillin G Rash     Objective:  General: Alert and oriented x3 in no acute distress Gait and pronation controlled with orthotics. There is significant genu valgum with tibial influence on pes planus. Orthotics contour the arch well with no pain or irritation providing mechanical support. No other acute findings.  Assessment and Plan: Problem List Items Addressed This Visit    None    Visit Diagnoses    Pes planus of both feet    -  Primary   Abnormality of gait       Plantar fasciitis       Lower limb length difference       Pain          -Complete Orthotic check examination performed -Patient is doing well with custom orthotics -Continue  with custom molded orthotics and good supportive shoes  -Patient to return to office as needed or sooner if new problems or issues arise.  Landis Martins, DPM

## 2017-07-20 ENCOUNTER — Other Ambulatory Visit: Payer: Self-pay

## 2017-07-20 NOTE — Telephone Encounter (Signed)
Pt husband calling back to inform that about 30 mins ago he reached out to [facility] and was told the referral had not yet been received.  I mae him aware that RN Terrence Dupont did note that she was made aware of the request to have the therapy at [facility] , pt husband wanted to provide their fax# if needed [phone].  Please call pt husband if there are questions re: his request.  He has been advised to allow the 1-2 weeks to be contacted

## 2017-07-20 NOTE — Patient Outreach (Signed)
[facility]) Care Management  07/20/2017  Zaniyah Wernette 05/28/1961 [id number]   EMMI: stroke Referral date: 07/19/17 Referral source:EMMI stroke red alert Referral reason: Scheduled follow up appointment: NO Day # 1  Telephone call to patient regarding EMMI stroke red alert. HIPAA verified with patient. Patient verbally agreed to [facility] speaking with her husband, Suesan Mohrmann regarding all of her personal health information. Discussed EMMI stroke program with husband. Husband agreed to ongoing EMMI stroke calls.  Husband states patient is doing ok. Husband reports patient patient struggles some to find her words and is processing slower. Husband denies patient having any additional deficits.  Husband states he is trying to get patients therapy set up. Husband states patient did not have therapy set up at discharge from the hospital. Husband states he has spoken with Dr. Clydene Fake office and they are going to send an order to [facility] therapy.  Husband reports patient has a follow up appointment scheduled with Dr. Leonie Man for 08/23/17. States patient does not have an appointment scheduled with her primary MD.  RNCM discussed importance of patient having follow up with her primary MD.  Husband verbalized understanding. ' Husband reports patient has all of her medications and is able to afford them. Also states he provides patient transportation to her appointment.  RNCM discussed signs/ symptoms of stroke. Advised to call 911 for stroke like symptoms.  RNCM advised to follow up with doctor for non emergent symptoms.  Husband verbalized understanding.  RNCM advised to have copy of living will on file at doctors office and hospital. Husband denies patient having any further needs at this time.   ASSESSMENT;  Date of Admission: 07/13/2017 Date of Discharge: 07/16/2017 DISCHARGE DIAGNOSIS:  Principal Problem:   Stroke (cerebrum) ([place]) -embolic  Left MCA branch infarct in a patient  with history of prior strokes and mild residual deficits, s/p tPA 56 y.o.femalewith a history of previous stroke which caused some difficulty with speech  PLAN:  RNCM will refer patient to care management assistant to close due to patient being assessed and having no further needs.  Quinn Plowman RN,BSN,CCM [facility] Telephonic  [phone]

## 2017-07-23 ENCOUNTER — Telehealth: Payer: Self-pay

## 2017-07-23 DIAGNOSIS — I519 Heart disease, unspecified: Secondary | ICD-10-CM | POA: Diagnosis not present

## 2017-07-23 DIAGNOSIS — I77810 Thoracic aortic ectasia: Secondary | ICD-10-CM | POA: Diagnosis not present

## 2017-07-23 DIAGNOSIS — Q211 Atrial septal defect: Secondary | ICD-10-CM | POA: Diagnosis not present

## 2017-07-23 DIAGNOSIS — I4891 Unspecified atrial fibrillation: Secondary | ICD-10-CM | POA: Diagnosis not present

## 2017-07-23 DIAGNOSIS — I10 Essential (primary) hypertension: Secondary | ICD-10-CM | POA: Diagnosis not present

## 2017-07-23 NOTE — Telephone Encounter (Signed)
LEft vm for patient/and husband to call back about lower venous doppler study results.  LEft vm for patient to call back about her lab work results of hgb A1c. ------

## 2017-07-23 NOTE — Telephone Encounter (Signed)
-----   Message from Garvin Fila, MD sent at 07/20/2017  5:50 PM EDT ----- Mitchell Heir inform the patient that lower extremity venous Doppler study was normal

## 2017-07-23 NOTE — Telephone Encounter (Signed)
Patient has been sent to [facility] [phone].

## 2017-07-25 DIAGNOSIS — R41841 Cognitive communication deficit: Secondary | ICD-10-CM | POA: Diagnosis not present

## 2017-07-25 DIAGNOSIS — I69320 Aphasia following cerebral infarction: Secondary | ICD-10-CM | POA: Diagnosis not present

## 2017-07-25 DIAGNOSIS — I6300 Cerebral infarction due to thrombosis of unspecified precerebral artery: Secondary | ICD-10-CM | POA: Diagnosis not present

## 2017-07-26 NOTE — Telephone Encounter (Signed)
Rn call patient that her lower extremity venous doppler was normal. Results were given to husband on the phone too. Pt and husband verbalized understanding.  Rn call patient that her hgb A1c is satisfactory. Pt verbalized understanding,and her husband. ------

## 2017-07-30 DIAGNOSIS — Z48812 Encounter for surgical aftercare following surgery on the circulatory system: Secondary | ICD-10-CM | POA: Diagnosis not present

## 2017-07-30 DIAGNOSIS — I7101 Dissection of thoracic aorta: Secondary | ICD-10-CM | POA: Diagnosis not present

## 2017-08-06 DIAGNOSIS — R41841 Cognitive communication deficit: Secondary | ICD-10-CM | POA: Diagnosis not present

## 2017-08-06 DIAGNOSIS — I69320 Aphasia following cerebral infarction: Secondary | ICD-10-CM | POA: Diagnosis not present

## 2017-08-06 DIAGNOSIS — I6300 Cerebral infarction due to thrombosis of unspecified precerebral artery: Secondary | ICD-10-CM | POA: Diagnosis not present

## 2017-08-09 DIAGNOSIS — Z8673 Personal history of transient ischemic attack (TIA), and cerebral infarction without residual deficits: Secondary | ICD-10-CM | POA: Insufficient documentation

## 2017-08-09 DIAGNOSIS — I6300 Cerebral infarction due to thrombosis of unspecified precerebral artery: Secondary | ICD-10-CM | POA: Diagnosis not present

## 2017-08-09 DIAGNOSIS — I10 Essential (primary) hypertension: Secondary | ICD-10-CM | POA: Diagnosis not present

## 2017-08-09 DIAGNOSIS — E782 Mixed hyperlipidemia: Secondary | ICD-10-CM | POA: Insufficient documentation

## 2017-08-09 DIAGNOSIS — I69320 Aphasia following cerebral infarction: Secondary | ICD-10-CM | POA: Diagnosis not present

## 2017-08-09 DIAGNOSIS — R41841 Cognitive communication deficit: Secondary | ICD-10-CM | POA: Diagnosis not present

## 2017-08-09 HISTORY — DX: Personal history of transient ischemic attack (TIA), and cerebral infarction without residual deficits: Z86.73

## 2017-08-09 HISTORY — DX: Essential (primary) hypertension: I10

## 2017-08-09 HISTORY — DX: Mixed hyperlipidemia: E78.2

## 2017-08-14 DIAGNOSIS — I6300 Cerebral infarction due to thrombosis of unspecified precerebral artery: Secondary | ICD-10-CM | POA: Diagnosis not present

## 2017-08-14 DIAGNOSIS — I69320 Aphasia following cerebral infarction: Secondary | ICD-10-CM | POA: Diagnosis not present

## 2017-08-14 DIAGNOSIS — R41841 Cognitive communication deficit: Secondary | ICD-10-CM | POA: Diagnosis not present

## 2017-08-15 DIAGNOSIS — I69320 Aphasia following cerebral infarction: Secondary | ICD-10-CM | POA: Diagnosis not present

## 2017-08-15 DIAGNOSIS — I6300 Cerebral infarction due to thrombosis of unspecified precerebral artery: Secondary | ICD-10-CM | POA: Diagnosis not present

## 2017-08-15 DIAGNOSIS — R41841 Cognitive communication deficit: Secondary | ICD-10-CM | POA: Diagnosis not present

## 2017-08-22 DIAGNOSIS — I69320 Aphasia following cerebral infarction: Secondary | ICD-10-CM | POA: Diagnosis not present

## 2017-08-22 DIAGNOSIS — R41841 Cognitive communication deficit: Secondary | ICD-10-CM | POA: Diagnosis not present

## 2017-08-22 DIAGNOSIS — I6300 Cerebral infarction due to thrombosis of unspecified precerebral artery: Secondary | ICD-10-CM | POA: Diagnosis not present

## 2017-08-23 ENCOUNTER — Ambulatory Visit (INDEPENDENT_AMBULATORY_CARE_PROVIDER_SITE_OTHER): Payer: Medicare HMO | Admitting: Neurology

## 2017-08-23 ENCOUNTER — Encounter (INDEPENDENT_AMBULATORY_CARE_PROVIDER_SITE_OTHER): Payer: Self-pay

## 2017-08-23 ENCOUNTER — Encounter: Payer: Self-pay | Admitting: Neurology

## 2017-08-23 VITALS — BP 104/61 | HR 69 | Ht 67.0 in | Wt 171.0 lb

## 2017-08-23 DIAGNOSIS — I63412 Cerebral infarction due to embolism of left middle cerebral artery: Secondary | ICD-10-CM

## 2017-08-23 NOTE — Patient Instructions (Signed)
I had a long d/w patient and her husband about his recent embolic stroke,paroxysmal atrial fibrillation risk for recurrent stroke/TIAs, personally independently reviewed imaging studies and stroke evaluation results and answered questions.Continue Eliquis (apixaban) daily  for secondary stroke prevention and maintain strict control of hypertension with blood pressure goal below 130/90, diabetes with hemoglobin A1c goal below 6.5% and lipids with LDL cholesterol goal below 70 mg/dL. I also advised the patient to eat a healthy diet with plenty of whole grains, cereals, fruits and vegetables, exercise regularly and maintain ideal body weight.Consider participation in [place] trial if interested Followup in the future with my nurse practitioner in 6 months or call earlier if necessary.  Stroke Prevention Some medical conditions and behaviors are associated with an increased chance of having a stroke. You may prevent a stroke by making healthy choices and managing medical conditions. How can I reduce my risk of having a stroke?  Stay physically active. Get at least 30 minutes of activity on most or all days.  Do not smoke. It may also be helpful to avoid exposure to secondhand smoke.  Limit alcohol use. Moderate alcohol use is considered to be: ? No more than 2 drinks per day for men. ? No more than 1 drink per day for nonpregnant women.  Eat healthy foods. This involves: ? Eating 5 or more servings of fruits and vegetables a day. ? Making dietary changes that address high blood pressure (hypertension), high cholesterol, diabetes, or obesity.  Manage your cholesterol levels. ? Making food choices that are high in fiber and low in saturated fat, trans fat, and cholesterol may control cholesterol levels. ? Take any prescribed medicines to control cholesterol as directed by your health care provider.  Manage your diabetes. ? Controlling your carbohydrate and sugar intake is recommended to manage  diabetes. ? Take any prescribed medicines to control diabetes as directed by your health care provider.  Control your hypertension. ? Making food choices that are low in salt (sodium), saturated fat, trans fat, and cholesterol is recommended to manage hypertension. ? Ask your health care provider if you need treatment to lower your blood pressure. Take any prescribed medicines to control hypertension as directed by your health care provider. ? If you are 68-20 years of age, have your blood pressure checked every 3-5 years. If you are 85 years of age or older, have your blood pressure checked every year.  Maintain a healthy weight. ? Reducing calorie intake and making food choices that are low in sodium, saturated fat, trans fat, and cholesterol are recommended to manage weight.  Stop drug abuse.  Avoid taking birth control pills. ? Talk to your health care provider about the risks of taking birth control pills if you are over 66 years old, smoke, get migraines, or have ever had a blood clot.  Get evaluated for sleep disorders (sleep apnea). ? Talk to your health care provider about getting a sleep evaluation if you snore a lot or have excessive sleepiness.  Take medicines only as directed by your health care provider. ? For some people, aspirin or blood thinners (anticoagulants) are helpful in reducing the risk of forming abnormal blood clots that can lead to stroke. If you have the irregular heart rhythm of atrial fibrillation, you should be on a blood thinner unless there is a good reason you cannot take them. ? Understand all your medicine instructions.  Make sure that other conditions (such as anemia or atherosclerosis) are addressed. Get help right away if:  You have sudden weakness or numbness of the face, arm, or leg, especially on one side of the body.  Your face or eyelid droops to one side.  You have sudden confusion.  You have trouble speaking (aphasia) or  understanding.  You have sudden trouble seeing in one or both eyes.  You have sudden trouble walking.  You have dizziness.  You have a loss of balance or coordination.  You have a sudden, severe headache with no known cause.  You have new chest pain or an irregular heartbeat. Any of these symptoms may represent a serious problem that is an emergency. Do not wait to see if the symptoms will go away. Get medical help at once. Call your local emergency services (911 in [place] Do not drive yourself to the hospital. This information is not intended to replace advice given to you by your health care provider. Make sure you discuss any questions you have with your health care provider. Document Released: 01/18/2005 Document Revised: 05/18/2016 Document Reviewed: 06/13/2013 Elsevier Interactive Patient Education  2017 [employer].

## 2017-08-23 NOTE — Progress Notes (Signed)
[facility] [address] [place]. [place] [address] [phone]       OFFICE FOLLOW-UP NOTE  Stacy Jenkins Date of Birth:  [birth year] Medical Record Number:  [id number]   HPI: Stacy Jenkins is a 56 year Caucasian lady seen today for first office follow-up visit following hospital admission for stroke in July 2018. She is accompanied by her husband. History is up 10 from the patient, husband, review of electronic medical records and have personally reviewed imaging films. I also reviewed records through care everywhere from her  Dr Remer Macho cardiologist in [place].Stacy Jenkins a 56 y.o.femalewith history of 3 previous strokes treated at [facility], recent aortic aneurysm repair," hole in the heart", and a chronic left internal carotid artery dissectionpresenting with aphasia. TPA at 1431 on Friday 07/13/2017 at [facility] per telemetry neurologist from [facility]. She was admitted to the neurological intensive care unit and blood pressure was tightly controlled as per post TPA protocol. Patient showed neurological improvement she did have some mild aphasia which gradually improved and on the day of discharge she had occasional word finding difficulties and some trouble understanding but was getting better. She was seen by physical occupational therapy and felt not to need inpatient therapy. She had transesophageal echocardiogram which did not show an obvious cardiac source of embolism but did show moderate to severe aortic regurgitation but it was difficult to compare this with her previous echocardiogram done in [place] is actual films are not available. She is advised to follow-up with her cardiologist in [facility] Dr. Remer Macho for further cardiac medication changes if necessary. She was also found to have a small atrial septal defect which was mostly showing left-to-right shunting and likely not related to her stroke. Since the  patient's stroke was clearly embolic and she did have a history of transient paroxysmal atrial fibrillation following her dissecting aortic aneurysm repair it was felt that she did not need long-term cardiac monitoring. to demonstrated A. fib and she was changed from aspirin to eliquis for secondary stroke prevention. Her CHAD2 Vasc score was 6 ( 13 % risk of stroke/TIA per year). LDL cholesterol was 70 mg percent and Pravachol was resumed. Transthoracic echo showed ejection fraction 40-45% the suspicion for possible vegetation TEE was recommended  which showed no definite thrombus but decrease ejection fraction of 40-45% with hypokinesis of inferior myocardium. Aortic valve showed evidence of leaflet prolapse and possible perforation of right coronary cusp. The ascending aortic tack chronic graft is surrounded by mass of heterogeneous tissue consistent with thrombosed hold aortic aneurysm lumen. There was a small 3 mm high secundum atrial septal defect but only showing small bidirectional predominantly left-to-right shunting and the atrial level She had history of transient atrial fibrillation following aortic surgery in the past and hence it was felt this was enough of an indication for long-term anticoagulation and prolonged cardiac monitoring was not ordered. She has done well  since discharge. She still struggling with her finding names and occasionally makes mild paraphasic errors. She is getting outpatient speech therapy and seems to be helping. She is otherwise independent in activities of daily living. She is tolerating eliquis well without bruising or bleeding. She has seen her cardiologist in [place] who ordered a repeat CT angiogram of the chest which apparently showed stable appearance of the ascending aortic aneurysm and discuss the case with cardiothoracic surgery and conservative management has been recommended at the present time. She is starting Pravachol without muscle aches and  pains.  Patient is well controlled. Is 104/61.  ROS:   14 system review of systems is positive for  fatigue, confusion, speech difficulties, naming difficulties and all other systems negative PMH:  Past Medical History:  Diagnosis Date  . Stroke [facility])     Social History:  Social History   Social History  . Marital status: Married    Spouse name: N/A  . Number of children: N/A  . Years of education: N/A   Occupational History  . Not on file.   Social History Main Topics  . Smoking status: Never Smoker  . Smokeless tobacco: Never Used  . Alcohol use No  . Drug use: No  . Sexual activity: Not on file   Other Topics Concern  . Not on file   Social History Narrative  . No narrative on file    Medications:   Current Outpatient Prescriptions on File Prior to Visit  Medication Sig Dispense Refill  . apixaban (ELIQUIS) 5 MG TABS tablet Take 1 tablet (5 mg total) by mouth 2 (two) times daily. 60 tablet 0  . carvedilol (COREG) 3.125 MG tablet Take 3.125 mg by mouth 2 (two) times daily with a meal.     . cholecalciferol (VITAMIN D) 1000 units tablet Take 1,000 Units by mouth daily.    Marland Kitchen lisinopril (PRINIVIL,ZESTRIL) 5 MG tablet Take 5 mg by mouth daily.     . Multiple Vitamin (MULTIVITAMIN) capsule Take by mouth.    . pravastatin (PRAVACHOL) 40 MG tablet Take by mouth.     No current facility-administered medications on file prior to visit.     Allergies:   Allergies  Allergen Reactions  . Penicillin G Rash    Physical Exam General: well developed, well nourished Middle-age Caucasian lady, seated, in no evident distress Head: head normocephalic and atraumatic.  Neck: supple with no carotid or supraclavicular bruits Cardiovascular: regular rate and rhythm, no murmurs Musculoskeletal: no deformity Skin:  no rash/petichiae Vascular:  Normal pulses all extremities Vitals:   08/23/17 1110  BP: 104/61  Pulse: 69   Neurologic Exam Mental Status: Awake and fully  alert. Oriented to place and time. Recent and remote memory intact. Attention span, concentration and fund of knowledge appropriate. Mood and affect appropriate. Nonfluent speech with word finding difficulties and occasional paraphasic errors. Difficulty with naming but able to repeat comprehend quite well. Cranial Nerves: Fundoscopic exam reveals sharp disc margins. Pupils equal, briskly reactive to light. Extraocular movements full without nystagmus. Visual fields full to confrontation. Hearing intact. Facial sensation intact. Face, tongue, palate moves normally and symmetrically.  Motor: Normal bulk and tone. Normal strength in all tested extremity muscles. Sensory.: intact to touch ,pinprick .position and vibratory sensation.  Coordination: Rapid alternating movements normal in all extremities. Finger-to-nose and heel-to-shin performed accurately bilaterally. Gait and Station: Arises from chair without difficulty. Stance is normal. Gait demonstrates normal stride length and balance . Able to heel, toe and tandem walk without difficulty.  Reflexes: 1+ and symmetric. Toes downgoing.   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 Vascular risk factors of hyperlipidemia, hypertension and remote atrial fibrillation    PLAN: I had a long d/w patient and her husband about his recent embolic stroke,paroxysmal atrial fibrillation risk for recurrent stroke/TIAs, personally independently reviewed imaging studies and stroke evaluation results and answered questions.Continue Eliquis (apixaban) daily  for secondary stroke prevention and maintain strict  control of hypertension with blood pressure goal below 130/90, diabetes with hemoglobin A1c goal below 6.5% and lipids with LDL cholesterol goal below 70 mg/dL. I also advised the patient to eat a healthy diet with plenty of whole grains,  cereals, fruits and vegetables, exercise regularly and maintain ideal body weight.Consider participation in [place] trial if interested Followup in the future with my nurse practitioner in 6 months or call earlier if necessary. Greater than 50% of time during this 25 minute visit was spent on counseling,explanation of diagnosis of embolic stroke, aphasia,atrial fibrillation,, planning of further management, discussion with patient and family and coordination of care Antony Contras, MD  [facility] [address] [place], [place] [address]  Phone [phone] Fax [phone] Note: This document was prepared with digital dictation and possible smart phrase technology. Any transcriptional errors that result from this process are unintentional

## 2017-08-24 DIAGNOSIS — I6300 Cerebral infarction due to thrombosis of unspecified precerebral artery: Secondary | ICD-10-CM | POA: Diagnosis not present

## 2017-08-24 DIAGNOSIS — I69320 Aphasia following cerebral infarction: Secondary | ICD-10-CM | POA: Diagnosis not present

## 2017-08-24 DIAGNOSIS — R41841 Cognitive communication deficit: Secondary | ICD-10-CM | POA: Diagnosis not present

## 2017-08-28 DIAGNOSIS — M6281 Muscle weakness (generalized): Secondary | ICD-10-CM | POA: Diagnosis not present

## 2017-08-28 DIAGNOSIS — R5383 Other fatigue: Secondary | ICD-10-CM | POA: Diagnosis not present

## 2017-08-28 DIAGNOSIS — R2681 Unsteadiness on feet: Secondary | ICD-10-CM | POA: Diagnosis not present

## 2017-08-28 DIAGNOSIS — Z8673 Personal history of transient ischemic attack (TIA), and cerebral infarction without residual deficits: Secondary | ICD-10-CM | POA: Diagnosis not present

## 2017-08-28 DIAGNOSIS — R2689 Other abnormalities of gait and mobility: Secondary | ICD-10-CM | POA: Diagnosis not present

## 2017-08-28 DIAGNOSIS — I6300 Cerebral infarction due to thrombosis of unspecified precerebral artery: Secondary | ICD-10-CM | POA: Diagnosis not present

## 2017-09-22 DIAGNOSIS — I6930 Unspecified sequelae of cerebral infarction: Secondary | ICD-10-CM | POA: Diagnosis not present

## 2017-09-22 DIAGNOSIS — L905 Scar conditions and fibrosis of skin: Secondary | ICD-10-CM | POA: Diagnosis not present

## 2017-09-22 DIAGNOSIS — Z0000 Encounter for general adult medical examination without abnormal findings: Secondary | ICD-10-CM | POA: Diagnosis not present

## 2017-09-22 DIAGNOSIS — Z86718 Personal history of other venous thrombosis and embolism: Secondary | ICD-10-CM | POA: Diagnosis not present

## 2017-09-22 DIAGNOSIS — Z7901 Long term (current) use of anticoagulants: Secondary | ICD-10-CM | POA: Diagnosis not present

## 2017-09-24 DIAGNOSIS — Z8673 Personal history of transient ischemic attack (TIA), and cerebral infarction without residual deficits: Secondary | ICD-10-CM | POA: Diagnosis not present

## 2017-09-24 DIAGNOSIS — R41841 Cognitive communication deficit: Secondary | ICD-10-CM | POA: Diagnosis not present

## 2017-09-24 DIAGNOSIS — R5383 Other fatigue: Secondary | ICD-10-CM | POA: Diagnosis not present

## 2017-09-24 DIAGNOSIS — M6281 Muscle weakness (generalized): Secondary | ICD-10-CM | POA: Diagnosis not present

## 2017-09-24 DIAGNOSIS — R2689 Other abnormalities of gait and mobility: Secondary | ICD-10-CM | POA: Diagnosis not present

## 2017-09-24 DIAGNOSIS — I69320 Aphasia following cerebral infarction: Secondary | ICD-10-CM | POA: Diagnosis not present

## 2017-09-24 DIAGNOSIS — R2681 Unsteadiness on feet: Secondary | ICD-10-CM | POA: Diagnosis not present

## 2017-09-24 DIAGNOSIS — I6300 Cerebral infarction due to thrombosis of unspecified precerebral artery: Secondary | ICD-10-CM | POA: Diagnosis not present

## 2017-10-24 ENCOUNTER — Other Ambulatory Visit: Payer: Self-pay

## 2017-10-25 ENCOUNTER — Other Ambulatory Visit: Payer: Self-pay

## 2017-10-26 ENCOUNTER — Other Ambulatory Visit: Payer: Self-pay

## 2017-10-29 DIAGNOSIS — R41841 Cognitive communication deficit: Secondary | ICD-10-CM | POA: Diagnosis not present

## 2017-10-29 DIAGNOSIS — I69320 Aphasia following cerebral infarction: Secondary | ICD-10-CM | POA: Diagnosis not present

## 2017-11-05 DIAGNOSIS — I5189 Other ill-defined heart diseases: Secondary | ICD-10-CM | POA: Diagnosis not present

## 2017-11-05 DIAGNOSIS — I517 Cardiomegaly: Secondary | ICD-10-CM | POA: Diagnosis not present

## 2017-11-05 DIAGNOSIS — I519 Heart disease, unspecified: Secondary | ICD-10-CM | POA: Diagnosis not present

## 2017-11-05 DIAGNOSIS — Z9889 Other specified postprocedural states: Secondary | ICD-10-CM | POA: Diagnosis not present

## 2017-11-05 DIAGNOSIS — Z79899 Other long term (current) drug therapy: Secondary | ICD-10-CM | POA: Diagnosis not present

## 2017-11-05 DIAGNOSIS — I10 Essential (primary) hypertension: Secondary | ICD-10-CM | POA: Diagnosis not present

## 2017-11-05 DIAGNOSIS — E785 Hyperlipidemia, unspecified: Secondary | ICD-10-CM | POA: Diagnosis not present

## 2017-11-05 DIAGNOSIS — I083 Combined rheumatic disorders of mitral, aortic and tricuspid valves: Secondary | ICD-10-CM | POA: Diagnosis not present

## 2017-11-06 DIAGNOSIS — I69320 Aphasia following cerebral infarction: Secondary | ICD-10-CM | POA: Diagnosis not present

## 2017-11-06 DIAGNOSIS — R41841 Cognitive communication deficit: Secondary | ICD-10-CM | POA: Diagnosis not present

## 2017-11-13 DIAGNOSIS — I480 Paroxysmal atrial fibrillation: Secondary | ICD-10-CM | POA: Diagnosis not present

## 2017-11-13 DIAGNOSIS — Q211 Atrial septal defect: Secondary | ICD-10-CM | POA: Diagnosis not present

## 2017-11-13 DIAGNOSIS — Z8679 Personal history of other diseases of the circulatory system: Secondary | ICD-10-CM | POA: Diagnosis not present

## 2017-11-13 DIAGNOSIS — I10 Essential (primary) hypertension: Secondary | ICD-10-CM | POA: Diagnosis not present

## 2017-11-14 DIAGNOSIS — I69320 Aphasia following cerebral infarction: Secondary | ICD-10-CM | POA: Diagnosis not present

## 2017-11-14 DIAGNOSIS — R41841 Cognitive communication deficit: Secondary | ICD-10-CM | POA: Diagnosis not present

## 2017-11-21 DIAGNOSIS — I69320 Aphasia following cerebral infarction: Secondary | ICD-10-CM | POA: Diagnosis not present

## 2017-11-21 DIAGNOSIS — R41841 Cognitive communication deficit: Secondary | ICD-10-CM | POA: Diagnosis not present

## 2017-11-28 DIAGNOSIS — R41841 Cognitive communication deficit: Secondary | ICD-10-CM | POA: Diagnosis not present

## 2017-11-28 DIAGNOSIS — I69320 Aphasia following cerebral infarction: Secondary | ICD-10-CM | POA: Diagnosis not present

## 2018-01-29 DIAGNOSIS — E162 Hypoglycemia, unspecified: Secondary | ICD-10-CM

## 2018-01-29 DIAGNOSIS — I429 Cardiomyopathy, unspecified: Secondary | ICD-10-CM | POA: Insufficient documentation

## 2018-01-29 DIAGNOSIS — E559 Vitamin D deficiency, unspecified: Secondary | ICD-10-CM

## 2018-01-29 DIAGNOSIS — Z8669 Personal history of other diseases of the nervous system and sense organs: Secondary | ICD-10-CM

## 2018-01-29 DIAGNOSIS — R7309 Other abnormal glucose: Secondary | ICD-10-CM | POA: Insufficient documentation

## 2018-01-29 DIAGNOSIS — K449 Diaphragmatic hernia without obstruction or gangrene: Secondary | ICD-10-CM

## 2018-01-29 DIAGNOSIS — R030 Elevated blood-pressure reading, without diagnosis of hypertension: Secondary | ICD-10-CM | POA: Insufficient documentation

## 2018-01-29 HISTORY — DX: Vitamin D deficiency, unspecified: E55.9

## 2018-01-29 HISTORY — DX: Cardiomyopathy, unspecified: I42.9

## 2018-01-29 HISTORY — DX: Elevated blood-pressure reading, without diagnosis of hypertension: R03.0

## 2018-01-29 HISTORY — DX: Diaphragmatic hernia without obstruction or gangrene: K44.9

## 2018-01-29 HISTORY — DX: Other abnormal glucose: R73.09

## 2018-01-29 HISTORY — DX: Personal history of other diseases of the nervous system and sense organs: Z86.69

## 2018-01-29 HISTORY — DX: Hypoglycemia, unspecified: E16.2

## 2018-02-07 DIAGNOSIS — M5136 Other intervertebral disc degeneration, lumbar region: Secondary | ICD-10-CM | POA: Diagnosis not present

## 2018-02-07 DIAGNOSIS — M5137 Other intervertebral disc degeneration, lumbosacral region: Secondary | ICD-10-CM | POA: Diagnosis not present

## 2018-02-07 DIAGNOSIS — M9903 Segmental and somatic dysfunction of lumbar region: Secondary | ICD-10-CM | POA: Diagnosis not present

## 2018-02-07 DIAGNOSIS — M5441 Lumbago with sciatica, right side: Secondary | ICD-10-CM | POA: Diagnosis not present

## 2018-02-07 DIAGNOSIS — M9904 Segmental and somatic dysfunction of sacral region: Secondary | ICD-10-CM | POA: Diagnosis not present

## 2018-02-11 DIAGNOSIS — Z8673 Personal history of transient ischemic attack (TIA), and cerebral infarction without residual deficits: Secondary | ICD-10-CM | POA: Diagnosis not present

## 2018-02-11 DIAGNOSIS — I10 Essential (primary) hypertension: Secondary | ICD-10-CM | POA: Diagnosis not present

## 2018-02-11 DIAGNOSIS — Z1159 Encounter for screening for other viral diseases: Secondary | ICD-10-CM | POA: Diagnosis not present

## 2018-02-11 DIAGNOSIS — I429 Cardiomyopathy, unspecified: Secondary | ICD-10-CM | POA: Diagnosis not present

## 2018-02-11 DIAGNOSIS — E782 Mixed hyperlipidemia: Secondary | ICD-10-CM | POA: Diagnosis not present

## 2018-02-11 DIAGNOSIS — Z0000 Encounter for general adult medical examination without abnormal findings: Secondary | ICD-10-CM | POA: Diagnosis not present

## 2018-02-11 DIAGNOSIS — E559 Vitamin D deficiency, unspecified: Secondary | ICD-10-CM | POA: Diagnosis not present

## 2018-02-11 DIAGNOSIS — M5440 Lumbago with sciatica, unspecified side: Secondary | ICD-10-CM | POA: Diagnosis not present

## 2018-02-22 NOTE — Progress Notes (Deleted)
[facility]  PATIENT: Stacy Jenkins DOB: 1961/02/04   REASON FOR VISIT: *** HISTORY FROM:    HISTORY OF PRESENT ILLNESS:  Stacy Jenkins is a 57 year Caucasian lady seen today for first office follow-up visit following hospital admission for stroke in July 2018. She is accompanied by her husband. History is up 10 from the patient, husband, review of electronic medical records and have personally reviewed imaging films. I also reviewed records through care everywhere from her  Dr Remer Macho cardiologist in [place].Stacy Phillipsis a 57 y.o.femalewith history of 3 previous strokes treated at [facility], recent aortic aneurysm repair," hole in the heart", and a chronic left internal carotid artery dissectionpresenting with aphasia. TPA at 1431 on Friday 07/13/2017 at [facility] per telemetry neurologist from [facility]. She was admitted to the neurological intensive care unit and blood pressure was tightly controlled as per post TPA protocol. Patient showed neurological improvement she did have some mild aphasia which gradually improved and on the day of discharge she had occasional word finding difficulties and some trouble understanding but was getting better. She was seen by physical occupational therapy and felt not to need inpatient therapy. She had transesophageal echocardiogram which did not show an obvious cardiac source of embolism but did show moderate to severe aortic regurgitation but it was difficult to compare this with her previous echocardiogram done in [place] is actual films are not available. She is advised to follow-up with her cardiologist in [facility] Dr. Remer Macho for further cardiac medication changes if necessary. She was also found to have a small atrial septal defect which was mostly showing left-to-right shunting and likely not related to her stroke. Since the patient's stroke was clearly embolic and she did have a  history of transient paroxysmal atrial fibrillation following her dissecting aortic aneurysm repair it was felt that she did not need long-term cardiac monitoring. to demonstrated A. fib and she was changed from aspirin to eliquis for secondary stroke prevention. Her CHAD2 Vasc score was 6 ( 13 % risk of stroke/TIA per year). LDL cholesterol was 70 mg percent and Pravachol was resumed. Transthoracic echo showed ejection fraction 40-45% the suspicion for possible vegetation TEE was recommended  which showed no definite thrombus but decrease ejection fraction of 40-45% with hypokinesis of inferior myocardium. Aortic valve showed evidence of leaflet prolapse and possible perforation of right coronary cusp. The ascending aortic tack chronic graft is surrounded by mass of heterogeneous tissue consistent with thrombosed hold aortic aneurysm lumen. There was a small 3 mm high secundum atrial septal defect but only showing small bidirectional predominantly left-to-right shunting and the atrial level She had history of transient atrial fibrillation following aortic surgery in the past and hence it was felt this was enough of an indication for long-term anticoagulation and prolonged cardiac monitoring was not ordered. She has done well  since discharge. She still struggling with her finding names and occasionally makes mild paraphasic errors. She is getting outpatient speech therapy and seems to be helping. She is otherwise independent in activities of daily living. She is tolerating eliquis well without bruising or bleeding. She has seen her cardiologist in [place] who ordered a repeat CT angiogram of the chest which apparently showed stable appearance of the ascending aortic aneurysm and discuss the case with cardiothoracic surgery and conservative management has been recommended at the present time. She is starting Pravachol without muscle aches and pains.  Patient is well controlled. Is 104/61. REVIEW OF  SYSTEMS: Full  14 system review of systems performed and notable only for those listed, all others are neg:  Constitutional: neg  Cardiovascular: neg Ear/Nose/Throat: neg  Skin: neg Eyes: neg Respiratory: neg Gastroitestinal: neg  Hematology/Lymphatic: neg  Endocrine: neg Musculoskeletal:neg Allergy/Immunology: neg Neurological: neg Psychiatric: neg Sleep : neg   ALLERGIES: Allergies  Allergen Reactions  . Penicillin G Rash    HOME MEDICATIONS: Outpatient Medications Prior to Visit  Medication Sig Dispense Refill  . apixaban (ELIQUIS) 5 MG TABS tablet Take 1 tablet (5 mg total) by mouth 2 (two) times daily. 60 tablet 0  . carvedilol (COREG) 3.125 MG tablet Take 3.125 mg by mouth 2 (two) times daily with a meal.     . cholecalciferol (VITAMIN D) 1000 units tablet Take 1,000 Units by mouth daily.    Marland Kitchen lisinopril (PRINIVIL,ZESTRIL) 5 MG tablet Take 5 mg by mouth daily.     . Multiple Vitamin (MULTIVITAMIN) capsule Take by mouth.    . pravastatin (PRAVACHOL) 40 MG tablet Take by mouth.     No facility-administered medications prior to visit.     PAST MEDICAL HISTORY: Past Medical History:  Diagnosis Date  . Stroke [facility])     PAST SURGICAL HISTORY: Past Surgical History:  Procedure Laterality Date  . aortic valve surgery  02/2016  . TEE WITHOUT CARDIOVERSION N/A 07/16/2017   Procedure: TRANSESOPHAGEAL ECHOCARDIOGRAM (TEE);  Surgeon: Sanda Klein, MD;  Location: [facility] ENDOSCOPY;  Service: Cardiovascular;  Laterality: N/A;    FAMILY HISTORY: No family history on file.  SOCIAL HISTORY: Social History   Socioeconomic History  . Marital status: Married    Spouse name: Not on file  . Number of children: Not on file  . Years of education: Not on file  . Highest education level: Not on file  Social Needs  . Financial resource strain: Not on file  . Food insecurity - worry: Not on file  . Food insecurity - inability: Not on file  . Transportation needs - medical: Not  on file  . Transportation needs - non-medical: Not on file  Occupational History  . Not on file  Tobacco Use  . Smoking status: Never Smoker  . Smokeless tobacco: Never Used  Substance and Sexual Activity  . Alcohol use: No  . Drug use: No  . Sexual activity: Not on file  Other Topics Concern  . Not on file  Social History Narrative  . Not on file     PHYSICAL EXAM  There were no vitals filed for this visit. There is no height or weight on file to calculate BMI.  Generalized: Well developed, in no acute distress  Head: normocephalic and atraumatic,. Oropharynx benign  Neck: Supple, no carotid bruits  Cardiac: Regular rate rhythm, no murmur  Musculoskeletal: No deformity   Neurological examination   Mentation: Alert oriented to time, place, history taking. Attention span and concentration appropriate. Recent and remote memory intact.  Follows all commands speech and language fluent.   Cranial nerve II-XII: Fundoscopic exam reveals sharp disc margins.Pupils were equal round reactive to light extraocular movements were full, visual field were full on confrontational test. Facial sensation and strength were normal. hearing was intact to finger rubbing bilaterally. Uvula tongue midline. head turning and shoulder shrug were normal and symmetric.Tongue protrusion into cheek strength was normal. Motor: normal bulk and tone, full strength in the BUE, BLE, fine finger movements normal, no pronator drift. No focal weakness Sensory: normal and symmetric to light touch, pinprick, and  Vibration, proprioception  Coordination: finger-nose-finger, heel-to-shin bilaterally, no dysmetria Reflexes: Brachioradialis 2/2, biceps 2/2, triceps 2/2, patellar 2/2, Achilles 2/2, plantar responses were flexor bilaterally. Gait and Station: Rising up from seated position without assistance, normal stance,  moderate stride, good arm swing, smooth turning, able to perform tiptoe, and heel walking without  difficulty. Tandem gait is steady  DIAGNOSTIC DATA (LABS, IMAGING, TESTING) - I reviewed patient records, labs, notes, testing and imaging myself where available.  Lab Results  Component Value Date   WBC 6.9 07/15/2017   HGB 12.0 07/15/2017   HCT 37.2 07/15/2017   MCV 91.2 07/15/2017   PLT 162 07/15/2017      Component Value Date/Time   NA 139 07/15/2017 0158   K 3.7 07/15/2017 0158   CL 108 07/15/2017 0158   CO2 25 07/15/2017 0158   GLUCOSE 114 (H) 07/15/2017 0158   BUN 13 07/15/2017 0158   CREATININE 0.75 07/15/2017 0158   CALCIUM 8.8 (L) 07/15/2017 0158   GFRNONAA >60 07/15/2017 0158   GFRAA >60 07/15/2017 0158   Lab Results  Component Value Date   CHOL 142 07/14/2017   HDL 53 07/14/2017   LDLCALC 70 07/14/2017   TRIG 94 07/14/2017   CHOLHDL 2.7 07/14/2017   Lab Results  Component Value Date   HGBA1C 5.5 07/14/2017     ASSESSMENT AND PLAN 70year Caucasian lady with 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 Vascular risk factors of hyperlipidemia, hypertension and remote atrial fibrillation    PLAN: I had a long d/w patient and her husband about his recent embolic stroke,paroxysmal atrial fibrillation risk for recurrent stroke/TIAs, personally independently reviewed imaging studies and stroke evaluation results and answered questions.Continue Eliquis (apixaban) daily  for secondary stroke prevention and maintain strict control of hypertension with blood pressure goal below 130/90, diabetes with hemoglobin A1c goal below 6.5% and lipids with LDL cholesterol goal below 70 mg/dL. I also advised the patient to eat a healthy diet with plenty of whole grains, cereals, fruits and vegetables, exercise regularly and maintain ideal body weight.Consider participation in [place] trial if interested Followup in the future with my nurse practitioner in 6 months or call earlier if necessary. Greater than 50% of time during this  25 minute visit was spent on counseling,explanation of diagnosis of embolic stroke, aphasia,atrial fibrillation,, planning of further management  Stacy Jenkins, [facility], [facility], APRN  [facility] [address] [place], [place] [address] [phone]

## 2018-02-25 ENCOUNTER — Ambulatory Visit: Payer: Medicare HMO | Admitting: Nurse Practitioner

## 2018-02-25 ENCOUNTER — Telehealth: Payer: Self-pay | Admitting: *Deleted

## 2018-02-25 ENCOUNTER — Encounter: Payer: Self-pay | Admitting: Nurse Practitioner

## 2018-02-25 NOTE — Telephone Encounter (Signed)
Patient was no show for follow up with NP today.  

## 2018-05-21 DIAGNOSIS — I429 Cardiomyopathy, unspecified: Secondary | ICD-10-CM | POA: Diagnosis not present

## 2018-05-21 DIAGNOSIS — I77810 Thoracic aortic ectasia: Secondary | ICD-10-CM | POA: Diagnosis not present

## 2018-05-27 DIAGNOSIS — J309 Allergic rhinitis, unspecified: Secondary | ICD-10-CM | POA: Diagnosis not present

## 2018-05-27 DIAGNOSIS — R32 Unspecified urinary incontinence: Secondary | ICD-10-CM | POA: Diagnosis not present

## 2018-05-27 DIAGNOSIS — Z7901 Long term (current) use of anticoagulants: Secondary | ICD-10-CM | POA: Diagnosis not present

## 2018-05-27 DIAGNOSIS — I69321 Dysphasia following cerebral infarction: Secondary | ICD-10-CM | POA: Diagnosis not present

## 2018-05-27 DIAGNOSIS — R002 Palpitations: Secondary | ICD-10-CM | POA: Diagnosis not present

## 2018-05-27 DIAGNOSIS — I69398 Other sequelae of cerebral infarction: Secondary | ICD-10-CM | POA: Diagnosis not present

## 2018-05-27 DIAGNOSIS — R269 Unspecified abnormalities of gait and mobility: Secondary | ICD-10-CM | POA: Diagnosis not present

## 2018-05-27 DIAGNOSIS — R52 Pain, unspecified: Secondary | ICD-10-CM | POA: Diagnosis not present

## 2018-05-27 DIAGNOSIS — I10 Essential (primary) hypertension: Secondary | ICD-10-CM | POA: Diagnosis not present

## 2018-05-27 DIAGNOSIS — E785 Hyperlipidemia, unspecified: Secondary | ICD-10-CM | POA: Diagnosis not present

## 2018-08-08 DIAGNOSIS — R2689 Other abnormalities of gait and mobility: Secondary | ICD-10-CM | POA: Diagnosis not present

## 2018-08-08 DIAGNOSIS — M6281 Muscle weakness (generalized): Secondary | ICD-10-CM | POA: Diagnosis not present

## 2018-08-08 DIAGNOSIS — R2681 Unsteadiness on feet: Secondary | ICD-10-CM | POA: Diagnosis not present

## 2018-08-12 DIAGNOSIS — M6281 Muscle weakness (generalized): Secondary | ICD-10-CM | POA: Diagnosis not present

## 2018-08-12 DIAGNOSIS — E782 Mixed hyperlipidemia: Secondary | ICD-10-CM | POA: Diagnosis not present

## 2018-08-12 DIAGNOSIS — R2681 Unsteadiness on feet: Secondary | ICD-10-CM | POA: Diagnosis not present

## 2018-08-12 DIAGNOSIS — Z8673 Personal history of transient ischemic attack (TIA), and cerebral infarction without residual deficits: Secondary | ICD-10-CM | POA: Diagnosis not present

## 2018-08-12 DIAGNOSIS — I429 Cardiomyopathy, unspecified: Secondary | ICD-10-CM | POA: Diagnosis not present

## 2018-08-12 DIAGNOSIS — R2689 Other abnormalities of gait and mobility: Secondary | ICD-10-CM | POA: Diagnosis not present

## 2018-08-12 DIAGNOSIS — I10 Essential (primary) hypertension: Secondary | ICD-10-CM | POA: Diagnosis not present

## 2018-08-19 DIAGNOSIS — I371 Nonrheumatic pulmonary valve insufficiency: Secondary | ICD-10-CM | POA: Diagnosis not present

## 2018-08-19 DIAGNOSIS — I517 Cardiomegaly: Secondary | ICD-10-CM | POA: Diagnosis not present

## 2018-08-19 DIAGNOSIS — M6281 Muscle weakness (generalized): Secondary | ICD-10-CM | POA: Diagnosis not present

## 2018-08-19 DIAGNOSIS — I351 Nonrheumatic aortic (valve) insufficiency: Secondary | ICD-10-CM | POA: Diagnosis not present

## 2018-08-19 DIAGNOSIS — I340 Nonrheumatic mitral (valve) insufficiency: Secondary | ICD-10-CM | POA: Diagnosis not present

## 2018-08-19 DIAGNOSIS — R2681 Unsteadiness on feet: Secondary | ICD-10-CM | POA: Diagnosis not present

## 2018-08-19 DIAGNOSIS — I071 Rheumatic tricuspid insufficiency: Secondary | ICD-10-CM | POA: Diagnosis not present

## 2018-08-19 DIAGNOSIS — I5189 Other ill-defined heart diseases: Secondary | ICD-10-CM | POA: Diagnosis not present

## 2018-08-19 DIAGNOSIS — Q238 Other congenital malformations of aortic and mitral valves: Secondary | ICD-10-CM | POA: Diagnosis not present

## 2018-08-19 DIAGNOSIS — R9430 Abnormal result of cardiovascular function study, unspecified: Secondary | ICD-10-CM | POA: Diagnosis not present

## 2018-08-19 DIAGNOSIS — R2689 Other abnormalities of gait and mobility: Secondary | ICD-10-CM | POA: Diagnosis not present

## 2018-08-20 DIAGNOSIS — I351 Nonrheumatic aortic (valve) insufficiency: Secondary | ICD-10-CM

## 2018-08-20 DIAGNOSIS — R931 Abnormal findings on diagnostic imaging of heart and coronary circulation: Secondary | ICD-10-CM | POA: Insufficient documentation

## 2018-08-20 HISTORY — DX: Nonrheumatic aortic (valve) insufficiency: I35.1

## 2018-08-20 HISTORY — DX: Abnormal findings on diagnostic imaging of heart and coronary circulation: R93.1

## 2018-08-28 DIAGNOSIS — R2689 Other abnormalities of gait and mobility: Secondary | ICD-10-CM | POA: Diagnosis not present

## 2018-08-28 DIAGNOSIS — R2681 Unsteadiness on feet: Secondary | ICD-10-CM | POA: Diagnosis not present

## 2018-08-28 DIAGNOSIS — M6281 Muscle weakness (generalized): Secondary | ICD-10-CM | POA: Diagnosis not present

## 2018-09-11 DIAGNOSIS — Z8673 Personal history of transient ischemic attack (TIA), and cerebral infarction without residual deficits: Secondary | ICD-10-CM | POA: Diagnosis not present

## 2018-09-11 DIAGNOSIS — E785 Hyperlipidemia, unspecified: Secondary | ICD-10-CM | POA: Diagnosis not present

## 2018-09-11 DIAGNOSIS — I2510 Atherosclerotic heart disease of native coronary artery without angina pectoris: Secondary | ICD-10-CM | POA: Diagnosis not present

## 2018-09-11 DIAGNOSIS — Z880 Allergy status to penicillin: Secondary | ICD-10-CM | POA: Diagnosis not present

## 2018-09-11 DIAGNOSIS — I255 Ischemic cardiomyopathy: Secondary | ICD-10-CM | POA: Diagnosis not present

## 2018-09-11 DIAGNOSIS — Z79899 Other long term (current) drug therapy: Secondary | ICD-10-CM | POA: Diagnosis not present

## 2018-09-11 DIAGNOSIS — I517 Cardiomegaly: Secondary | ICD-10-CM | POA: Diagnosis not present

## 2018-09-11 DIAGNOSIS — I081 Rheumatic disorders of both mitral and tricuspid valves: Secondary | ICD-10-CM | POA: Diagnosis not present

## 2018-09-11 DIAGNOSIS — Z95828 Presence of other vascular implants and grafts: Secondary | ICD-10-CM | POA: Diagnosis not present

## 2018-09-11 DIAGNOSIS — Z8774 Personal history of (corrected) congenital malformations of heart and circulatory system: Secondary | ICD-10-CM | POA: Diagnosis not present

## 2018-09-11 DIAGNOSIS — I351 Nonrheumatic aortic (valve) insufficiency: Secondary | ICD-10-CM | POA: Diagnosis not present

## 2018-09-11 DIAGNOSIS — I77819 Aortic ectasia, unspecified site: Secondary | ICD-10-CM | POA: Diagnosis not present

## 2018-09-11 DIAGNOSIS — Z7901 Long term (current) use of anticoagulants: Secondary | ICD-10-CM | POA: Diagnosis not present

## 2018-09-11 DIAGNOSIS — I313 Pericardial effusion (noninflammatory): Secondary | ICD-10-CM | POA: Diagnosis not present

## 2018-09-24 DIAGNOSIS — I351 Nonrheumatic aortic (valve) insufficiency: Secondary | ICD-10-CM | POA: Diagnosis not present

## 2018-10-29 DIAGNOSIS — R69 Illness, unspecified: Secondary | ICD-10-CM | POA: Diagnosis not present

## 2018-11-07 DIAGNOSIS — I351 Nonrheumatic aortic (valve) insufficiency: Secondary | ICD-10-CM | POA: Diagnosis not present

## 2018-11-07 DIAGNOSIS — R918 Other nonspecific abnormal finding of lung field: Secondary | ICD-10-CM | POA: Diagnosis not present

## 2018-11-07 DIAGNOSIS — I7103 Dissection of thoracoabdominal aorta: Secondary | ICD-10-CM | POA: Diagnosis not present

## 2018-11-07 DIAGNOSIS — Z952 Presence of prosthetic heart valve: Secondary | ICD-10-CM | POA: Diagnosis not present

## 2018-11-07 DIAGNOSIS — Z79899 Other long term (current) drug therapy: Secondary | ICD-10-CM | POA: Diagnosis not present

## 2018-11-17 DIAGNOSIS — I420 Dilated cardiomyopathy: Secondary | ICD-10-CM | POA: Diagnosis not present

## 2018-11-17 DIAGNOSIS — I351 Nonrheumatic aortic (valve) insufficiency: Secondary | ICD-10-CM | POA: Diagnosis not present

## 2018-11-17 DIAGNOSIS — Z9889 Other specified postprocedural states: Secondary | ICD-10-CM | POA: Diagnosis not present

## 2018-11-17 DIAGNOSIS — I69320 Aphasia following cerebral infarction: Secondary | ICD-10-CM | POA: Diagnosis not present

## 2018-11-17 DIAGNOSIS — Z952 Presence of prosthetic heart valve: Secondary | ICD-10-CM | POA: Diagnosis not present

## 2018-11-17 DIAGNOSIS — Q211 Atrial septal defect: Secondary | ICD-10-CM | POA: Diagnosis not present

## 2018-11-17 DIAGNOSIS — T82398A Other mechanical complication of other vascular grafts, initial encounter: Secondary | ICD-10-CM | POA: Diagnosis not present

## 2018-11-17 DIAGNOSIS — E785 Hyperlipidemia, unspecified: Secondary | ICD-10-CM | POA: Diagnosis not present

## 2018-11-17 DIAGNOSIS — I4891 Unspecified atrial fibrillation: Secondary | ICD-10-CM | POA: Diagnosis not present

## 2018-11-17 DIAGNOSIS — R918 Other nonspecific abnormal finding of lung field: Secondary | ICD-10-CM | POA: Diagnosis not present

## 2018-11-17 DIAGNOSIS — I712 Thoracic aortic aneurysm, without rupture: Secondary | ICD-10-CM | POA: Diagnosis not present

## 2018-11-17 DIAGNOSIS — J9811 Atelectasis: Secondary | ICD-10-CM | POA: Diagnosis not present

## 2018-11-17 DIAGNOSIS — R1312 Dysphagia, oropharyngeal phase: Secondary | ICD-10-CM | POA: Diagnosis not present

## 2018-11-17 DIAGNOSIS — Z4682 Encounter for fitting and adjustment of non-vascular catheter: Secondary | ICD-10-CM | POA: Diagnosis not present

## 2018-11-17 DIAGNOSIS — G8918 Other acute postprocedural pain: Secondary | ICD-10-CM | POA: Diagnosis not present

## 2018-11-17 DIAGNOSIS — Z7901 Long term (current) use of anticoagulants: Secondary | ICD-10-CM | POA: Diagnosis not present

## 2018-11-17 DIAGNOSIS — J811 Chronic pulmonary edema: Secondary | ICD-10-CM | POA: Diagnosis not present

## 2018-11-17 DIAGNOSIS — K449 Diaphragmatic hernia without obstruction or gangrene: Secondary | ICD-10-CM | POA: Diagnosis not present

## 2018-11-17 DIAGNOSIS — J90 Pleural effusion, not elsewhere classified: Secondary | ICD-10-CM | POA: Diagnosis not present

## 2018-11-17 DIAGNOSIS — I10 Essential (primary) hypertension: Secondary | ICD-10-CM | POA: Diagnosis not present

## 2018-11-18 DIAGNOSIS — J9811 Atelectasis: Secondary | ICD-10-CM | POA: Diagnosis not present

## 2018-11-18 DIAGNOSIS — I351 Nonrheumatic aortic (valve) insufficiency: Secondary | ICD-10-CM | POA: Diagnosis not present

## 2018-11-18 DIAGNOSIS — I420 Dilated cardiomyopathy: Secondary | ICD-10-CM | POA: Diagnosis not present

## 2018-11-18 DIAGNOSIS — T82398A Other mechanical complication of other vascular grafts, initial encounter: Secondary | ICD-10-CM | POA: Diagnosis not present

## 2018-11-18 DIAGNOSIS — Z4682 Encounter for fitting and adjustment of non-vascular catheter: Secondary | ICD-10-CM | POA: Diagnosis not present

## 2018-11-18 DIAGNOSIS — R918 Other nonspecific abnormal finding of lung field: Secondary | ICD-10-CM | POA: Diagnosis not present

## 2018-11-19 DIAGNOSIS — G8918 Other acute postprocedural pain: Secondary | ICD-10-CM | POA: Insufficient documentation

## 2018-11-19 DIAGNOSIS — Z952 Presence of prosthetic heart valve: Secondary | ICD-10-CM | POA: Diagnosis not present

## 2018-11-19 DIAGNOSIS — D62 Acute posthemorrhagic anemia: Secondary | ICD-10-CM

## 2018-11-19 DIAGNOSIS — I69320 Aphasia following cerebral infarction: Secondary | ICD-10-CM | POA: Diagnosis not present

## 2018-11-19 DIAGNOSIS — J811 Chronic pulmonary edema: Secondary | ICD-10-CM | POA: Diagnosis not present

## 2018-11-19 DIAGNOSIS — Z4682 Encounter for fitting and adjustment of non-vascular catheter: Secondary | ICD-10-CM | POA: Diagnosis not present

## 2018-11-19 DIAGNOSIS — K449 Diaphragmatic hernia without obstruction or gangrene: Secondary | ICD-10-CM | POA: Diagnosis not present

## 2018-11-19 DIAGNOSIS — J9811 Atelectasis: Secondary | ICD-10-CM | POA: Diagnosis not present

## 2018-11-19 DIAGNOSIS — I351 Nonrheumatic aortic (valve) insufficiency: Secondary | ICD-10-CM | POA: Diagnosis not present

## 2018-11-19 HISTORY — DX: Other acute postprocedural pain: G89.18

## 2018-11-19 HISTORY — DX: Presence of prosthetic heart valve: Z95.2

## 2018-11-19 HISTORY — DX: Acute posthemorrhagic anemia: D62

## 2018-11-20 DIAGNOSIS — Z952 Presence of prosthetic heart valve: Secondary | ICD-10-CM | POA: Diagnosis not present

## 2018-11-20 DIAGNOSIS — I4891 Unspecified atrial fibrillation: Secondary | ICD-10-CM | POA: Insufficient documentation

## 2018-11-20 DIAGNOSIS — J90 Pleural effusion, not elsewhere classified: Secondary | ICD-10-CM | POA: Diagnosis not present

## 2018-11-20 DIAGNOSIS — I69320 Aphasia following cerebral infarction: Secondary | ICD-10-CM | POA: Diagnosis not present

## 2018-11-20 DIAGNOSIS — R4701 Aphasia: Secondary | ICD-10-CM | POA: Insufficient documentation

## 2018-11-20 DIAGNOSIS — Z9889 Other specified postprocedural states: Secondary | ICD-10-CM

## 2018-11-20 DIAGNOSIS — R918 Other nonspecific abnormal finding of lung field: Secondary | ICD-10-CM | POA: Diagnosis not present

## 2018-11-20 DIAGNOSIS — I9789 Other postprocedural complications and disorders of the circulatory system, not elsewhere classified: Secondary | ICD-10-CM

## 2018-11-20 DIAGNOSIS — Z8679 Personal history of other diseases of the circulatory system: Secondary | ICD-10-CM

## 2018-11-20 HISTORY — DX: Personal history of other diseases of the circulatory system: Z86.79

## 2018-11-20 HISTORY — DX: Unspecified atrial fibrillation: I48.91

## 2018-11-20 HISTORY — DX: Personal history of other diseases of the circulatory system: Z98.890

## 2018-11-20 HISTORY — DX: Aphasia: R47.01

## 2018-11-21 DIAGNOSIS — R918 Other nonspecific abnormal finding of lung field: Secondary | ICD-10-CM | POA: Diagnosis not present

## 2018-11-21 DIAGNOSIS — I69320 Aphasia following cerebral infarction: Secondary | ICD-10-CM | POA: Diagnosis not present

## 2018-11-21 DIAGNOSIS — Z952 Presence of prosthetic heart valve: Secondary | ICD-10-CM | POA: Diagnosis not present

## 2018-11-22 DIAGNOSIS — Z952 Presence of prosthetic heart valve: Secondary | ICD-10-CM | POA: Diagnosis not present

## 2018-11-22 DIAGNOSIS — J9811 Atelectasis: Secondary | ICD-10-CM | POA: Diagnosis not present

## 2018-11-22 DIAGNOSIS — R1312 Dysphagia, oropharyngeal phase: Secondary | ICD-10-CM | POA: Diagnosis not present

## 2018-11-22 DIAGNOSIS — R918 Other nonspecific abnormal finding of lung field: Secondary | ICD-10-CM | POA: Diagnosis not present

## 2018-11-23 DIAGNOSIS — R1312 Dysphagia, oropharyngeal phase: Secondary | ICD-10-CM | POA: Diagnosis not present

## 2018-11-24 DIAGNOSIS — R1312 Dysphagia, oropharyngeal phase: Secondary | ICD-10-CM | POA: Diagnosis not present

## 2018-11-25 DIAGNOSIS — Z7982 Long term (current) use of aspirin: Secondary | ICD-10-CM | POA: Diagnosis not present

## 2018-11-25 DIAGNOSIS — I352 Nonrheumatic aortic (valve) stenosis with insufficiency: Secondary | ICD-10-CM | POA: Diagnosis not present

## 2018-11-25 DIAGNOSIS — D62 Acute posthemorrhagic anemia: Secondary | ICD-10-CM | POA: Diagnosis not present

## 2018-11-25 DIAGNOSIS — Z48812 Encounter for surgical aftercare following surgery on the circulatory system: Secondary | ICD-10-CM | POA: Diagnosis not present

## 2018-11-25 DIAGNOSIS — Z952 Presence of prosthetic heart valve: Secondary | ICD-10-CM | POA: Diagnosis not present

## 2018-11-25 DIAGNOSIS — I4891 Unspecified atrial fibrillation: Secondary | ICD-10-CM | POA: Diagnosis not present

## 2018-11-25 DIAGNOSIS — I7100 Dissection of unspecified site of aorta: Secondary | ICD-10-CM | POA: Diagnosis not present

## 2018-11-25 DIAGNOSIS — I69320 Aphasia following cerebral infarction: Secondary | ICD-10-CM | POA: Diagnosis not present

## 2018-11-25 DIAGNOSIS — Z951 Presence of aortocoronary bypass graft: Secondary | ICD-10-CM | POA: Diagnosis not present

## 2018-11-25 DIAGNOSIS — I10 Essential (primary) hypertension: Secondary | ICD-10-CM | POA: Diagnosis not present

## 2018-11-26 DIAGNOSIS — Z48812 Encounter for surgical aftercare following surgery on the circulatory system: Secondary | ICD-10-CM | POA: Diagnosis not present

## 2018-11-26 DIAGNOSIS — Z952 Presence of prosthetic heart valve: Secondary | ICD-10-CM | POA: Diagnosis not present

## 2018-11-26 DIAGNOSIS — Z951 Presence of aortocoronary bypass graft: Secondary | ICD-10-CM | POA: Diagnosis not present

## 2018-11-26 DIAGNOSIS — D62 Acute posthemorrhagic anemia: Secondary | ICD-10-CM | POA: Diagnosis not present

## 2018-11-26 DIAGNOSIS — I4891 Unspecified atrial fibrillation: Secondary | ICD-10-CM | POA: Diagnosis not present

## 2018-11-26 DIAGNOSIS — Z7982 Long term (current) use of aspirin: Secondary | ICD-10-CM | POA: Diagnosis not present

## 2018-11-26 DIAGNOSIS — I10 Essential (primary) hypertension: Secondary | ICD-10-CM | POA: Diagnosis not present

## 2018-11-26 DIAGNOSIS — I352 Nonrheumatic aortic (valve) stenosis with insufficiency: Secondary | ICD-10-CM | POA: Diagnosis not present

## 2018-11-26 DIAGNOSIS — I7100 Dissection of unspecified site of aorta: Secondary | ICD-10-CM | POA: Diagnosis not present

## 2018-11-26 DIAGNOSIS — I69320 Aphasia following cerebral infarction: Secondary | ICD-10-CM | POA: Diagnosis not present

## 2018-11-28 DIAGNOSIS — I4891 Unspecified atrial fibrillation: Secondary | ICD-10-CM | POA: Diagnosis not present

## 2018-11-28 DIAGNOSIS — I7100 Dissection of unspecified site of aorta: Secondary | ICD-10-CM | POA: Diagnosis not present

## 2018-11-28 DIAGNOSIS — Z952 Presence of prosthetic heart valve: Secondary | ICD-10-CM | POA: Diagnosis not present

## 2018-11-28 DIAGNOSIS — Z951 Presence of aortocoronary bypass graft: Secondary | ICD-10-CM | POA: Diagnosis not present

## 2018-11-28 DIAGNOSIS — D62 Acute posthemorrhagic anemia: Secondary | ICD-10-CM | POA: Diagnosis not present

## 2018-11-28 DIAGNOSIS — I352 Nonrheumatic aortic (valve) stenosis with insufficiency: Secondary | ICD-10-CM | POA: Diagnosis not present

## 2018-11-28 DIAGNOSIS — Z7982 Long term (current) use of aspirin: Secondary | ICD-10-CM | POA: Diagnosis not present

## 2018-11-28 DIAGNOSIS — Z48812 Encounter for surgical aftercare following surgery on the circulatory system: Secondary | ICD-10-CM | POA: Diagnosis not present

## 2018-11-28 DIAGNOSIS — I69320 Aphasia following cerebral infarction: Secondary | ICD-10-CM | POA: Diagnosis not present

## 2018-11-28 DIAGNOSIS — I10 Essential (primary) hypertension: Secondary | ICD-10-CM | POA: Diagnosis not present

## 2018-12-02 DIAGNOSIS — Z951 Presence of aortocoronary bypass graft: Secondary | ICD-10-CM | POA: Diagnosis not present

## 2018-12-02 DIAGNOSIS — I69320 Aphasia following cerebral infarction: Secondary | ICD-10-CM | POA: Diagnosis not present

## 2018-12-02 DIAGNOSIS — Z48812 Encounter for surgical aftercare following surgery on the circulatory system: Secondary | ICD-10-CM | POA: Diagnosis not present

## 2018-12-02 DIAGNOSIS — I352 Nonrheumatic aortic (valve) stenosis with insufficiency: Secondary | ICD-10-CM | POA: Diagnosis not present

## 2018-12-02 DIAGNOSIS — I7100 Dissection of unspecified site of aorta: Secondary | ICD-10-CM | POA: Diagnosis not present

## 2018-12-02 DIAGNOSIS — D62 Acute posthemorrhagic anemia: Secondary | ICD-10-CM | POA: Diagnosis not present

## 2018-12-02 DIAGNOSIS — I4891 Unspecified atrial fibrillation: Secondary | ICD-10-CM | POA: Diagnosis not present

## 2018-12-02 DIAGNOSIS — I10 Essential (primary) hypertension: Secondary | ICD-10-CM | POA: Diagnosis not present

## 2018-12-02 DIAGNOSIS — Z7982 Long term (current) use of aspirin: Secondary | ICD-10-CM | POA: Diagnosis not present

## 2018-12-02 DIAGNOSIS — Z952 Presence of prosthetic heart valve: Secondary | ICD-10-CM | POA: Diagnosis not present

## 2018-12-04 DIAGNOSIS — I10 Essential (primary) hypertension: Secondary | ICD-10-CM | POA: Diagnosis not present

## 2018-12-04 DIAGNOSIS — Z7982 Long term (current) use of aspirin: Secondary | ICD-10-CM | POA: Diagnosis not present

## 2018-12-04 DIAGNOSIS — I7100 Dissection of unspecified site of aorta: Secondary | ICD-10-CM | POA: Diagnosis not present

## 2018-12-04 DIAGNOSIS — I352 Nonrheumatic aortic (valve) stenosis with insufficiency: Secondary | ICD-10-CM | POA: Diagnosis not present

## 2018-12-04 DIAGNOSIS — D62 Acute posthemorrhagic anemia: Secondary | ICD-10-CM | POA: Diagnosis not present

## 2018-12-04 DIAGNOSIS — Z48812 Encounter for surgical aftercare following surgery on the circulatory system: Secondary | ICD-10-CM | POA: Diagnosis not present

## 2018-12-04 DIAGNOSIS — I69320 Aphasia following cerebral infarction: Secondary | ICD-10-CM | POA: Diagnosis not present

## 2018-12-04 DIAGNOSIS — I4891 Unspecified atrial fibrillation: Secondary | ICD-10-CM | POA: Diagnosis not present

## 2018-12-04 DIAGNOSIS — Z952 Presence of prosthetic heart valve: Secondary | ICD-10-CM | POA: Diagnosis not present

## 2018-12-04 DIAGNOSIS — Z951 Presence of aortocoronary bypass graft: Secondary | ICD-10-CM | POA: Diagnosis not present

## 2018-12-05 DIAGNOSIS — I10 Essential (primary) hypertension: Secondary | ICD-10-CM | POA: Diagnosis not present

## 2018-12-05 DIAGNOSIS — Z7982 Long term (current) use of aspirin: Secondary | ICD-10-CM | POA: Diagnosis not present

## 2018-12-05 DIAGNOSIS — I7100 Dissection of unspecified site of aorta: Secondary | ICD-10-CM | POA: Diagnosis not present

## 2018-12-05 DIAGNOSIS — I69320 Aphasia following cerebral infarction: Secondary | ICD-10-CM | POA: Diagnosis not present

## 2018-12-05 DIAGNOSIS — Z48812 Encounter for surgical aftercare following surgery on the circulatory system: Secondary | ICD-10-CM | POA: Diagnosis not present

## 2018-12-05 DIAGNOSIS — Z952 Presence of prosthetic heart valve: Secondary | ICD-10-CM | POA: Diagnosis not present

## 2018-12-05 DIAGNOSIS — Z951 Presence of aortocoronary bypass graft: Secondary | ICD-10-CM | POA: Diagnosis not present

## 2018-12-05 DIAGNOSIS — D62 Acute posthemorrhagic anemia: Secondary | ICD-10-CM | POA: Diagnosis not present

## 2018-12-05 DIAGNOSIS — I352 Nonrheumatic aortic (valve) stenosis with insufficiency: Secondary | ICD-10-CM | POA: Diagnosis not present

## 2018-12-05 DIAGNOSIS — I4891 Unspecified atrial fibrillation: Secondary | ICD-10-CM | POA: Diagnosis not present

## 2018-12-09 DIAGNOSIS — Z79899 Other long term (current) drug therapy: Secondary | ICD-10-CM | POA: Diagnosis not present

## 2018-12-09 DIAGNOSIS — R5383 Other fatigue: Secondary | ICD-10-CM | POA: Diagnosis not present

## 2018-12-09 DIAGNOSIS — Z48812 Encounter for surgical aftercare following surgery on the circulatory system: Secondary | ICD-10-CM | POA: Diagnosis not present

## 2018-12-09 DIAGNOSIS — J9811 Atelectasis: Secondary | ICD-10-CM | POA: Diagnosis not present

## 2018-12-09 DIAGNOSIS — Z952 Presence of prosthetic heart valve: Secondary | ICD-10-CM | POA: Diagnosis not present

## 2018-12-09 DIAGNOSIS — R918 Other nonspecific abnormal finding of lung field: Secondary | ICD-10-CM | POA: Diagnosis not present

## 2018-12-09 DIAGNOSIS — Z8679 Personal history of other diseases of the circulatory system: Secondary | ICD-10-CM | POA: Diagnosis not present

## 2018-12-09 DIAGNOSIS — Z9889 Other specified postprocedural states: Secondary | ICD-10-CM | POA: Diagnosis not present

## 2018-12-09 DIAGNOSIS — J90 Pleural effusion, not elsewhere classified: Secondary | ICD-10-CM | POA: Diagnosis not present

## 2018-12-12 DIAGNOSIS — I4891 Unspecified atrial fibrillation: Secondary | ICD-10-CM | POA: Diagnosis not present

## 2018-12-12 DIAGNOSIS — D62 Acute posthemorrhagic anemia: Secondary | ICD-10-CM | POA: Diagnosis not present

## 2018-12-12 DIAGNOSIS — I352 Nonrheumatic aortic (valve) stenosis with insufficiency: Secondary | ICD-10-CM | POA: Diagnosis not present

## 2018-12-12 DIAGNOSIS — I10 Essential (primary) hypertension: Secondary | ICD-10-CM | POA: Diagnosis not present

## 2018-12-12 DIAGNOSIS — Z48812 Encounter for surgical aftercare following surgery on the circulatory system: Secondary | ICD-10-CM | POA: Diagnosis not present

## 2018-12-12 DIAGNOSIS — I69320 Aphasia following cerebral infarction: Secondary | ICD-10-CM | POA: Diagnosis not present

## 2018-12-12 DIAGNOSIS — Z952 Presence of prosthetic heart valve: Secondary | ICD-10-CM | POA: Diagnosis not present

## 2018-12-12 DIAGNOSIS — I7100 Dissection of unspecified site of aorta: Secondary | ICD-10-CM | POA: Diagnosis not present

## 2018-12-12 DIAGNOSIS — Z951 Presence of aortocoronary bypass graft: Secondary | ICD-10-CM | POA: Diagnosis not present

## 2018-12-12 DIAGNOSIS — Z7982 Long term (current) use of aspirin: Secondary | ICD-10-CM | POA: Diagnosis not present

## 2018-12-19 DIAGNOSIS — Z7982 Long term (current) use of aspirin: Secondary | ICD-10-CM | POA: Diagnosis not present

## 2018-12-19 DIAGNOSIS — D62 Acute posthemorrhagic anemia: Secondary | ICD-10-CM | POA: Diagnosis not present

## 2018-12-19 DIAGNOSIS — I7100 Dissection of unspecified site of aorta: Secondary | ICD-10-CM | POA: Diagnosis not present

## 2018-12-19 DIAGNOSIS — Z951 Presence of aortocoronary bypass graft: Secondary | ICD-10-CM | POA: Diagnosis not present

## 2018-12-19 DIAGNOSIS — I10 Essential (primary) hypertension: Secondary | ICD-10-CM | POA: Diagnosis not present

## 2018-12-19 DIAGNOSIS — I4891 Unspecified atrial fibrillation: Secondary | ICD-10-CM | POA: Diagnosis not present

## 2018-12-19 DIAGNOSIS — Z48812 Encounter for surgical aftercare following surgery on the circulatory system: Secondary | ICD-10-CM | POA: Diagnosis not present

## 2018-12-19 DIAGNOSIS — I69320 Aphasia following cerebral infarction: Secondary | ICD-10-CM | POA: Diagnosis not present

## 2018-12-19 DIAGNOSIS — I352 Nonrheumatic aortic (valve) stenosis with insufficiency: Secondary | ICD-10-CM | POA: Diagnosis not present

## 2018-12-19 DIAGNOSIS — Z952 Presence of prosthetic heart valve: Secondary | ICD-10-CM | POA: Diagnosis not present

## 2018-12-24 DIAGNOSIS — I4891 Unspecified atrial fibrillation: Secondary | ICD-10-CM | POA: Diagnosis not present

## 2018-12-24 DIAGNOSIS — Z951 Presence of aortocoronary bypass graft: Secondary | ICD-10-CM | POA: Diagnosis not present

## 2018-12-24 DIAGNOSIS — D62 Acute posthemorrhagic anemia: Secondary | ICD-10-CM | POA: Diagnosis not present

## 2018-12-24 DIAGNOSIS — Z48812 Encounter for surgical aftercare following surgery on the circulatory system: Secondary | ICD-10-CM | POA: Diagnosis not present

## 2018-12-24 DIAGNOSIS — Z952 Presence of prosthetic heart valve: Secondary | ICD-10-CM | POA: Diagnosis not present

## 2018-12-24 DIAGNOSIS — I7100 Dissection of unspecified site of aorta: Secondary | ICD-10-CM | POA: Diagnosis not present

## 2018-12-24 DIAGNOSIS — I10 Essential (primary) hypertension: Secondary | ICD-10-CM | POA: Diagnosis not present

## 2018-12-24 DIAGNOSIS — I352 Nonrheumatic aortic (valve) stenosis with insufficiency: Secondary | ICD-10-CM | POA: Diagnosis not present

## 2018-12-24 DIAGNOSIS — Z7982 Long term (current) use of aspirin: Secondary | ICD-10-CM | POA: Diagnosis not present

## 2018-12-24 DIAGNOSIS — I69320 Aphasia following cerebral infarction: Secondary | ICD-10-CM | POA: Diagnosis not present

## 2018-12-26 DIAGNOSIS — D62 Acute posthemorrhagic anemia: Secondary | ICD-10-CM | POA: Diagnosis not present

## 2018-12-26 DIAGNOSIS — I7100 Dissection of unspecified site of aorta: Secondary | ICD-10-CM | POA: Diagnosis not present

## 2018-12-26 DIAGNOSIS — Z7982 Long term (current) use of aspirin: Secondary | ICD-10-CM | POA: Diagnosis not present

## 2018-12-26 DIAGNOSIS — I352 Nonrheumatic aortic (valve) stenosis with insufficiency: Secondary | ICD-10-CM | POA: Diagnosis not present

## 2018-12-26 DIAGNOSIS — I69320 Aphasia following cerebral infarction: Secondary | ICD-10-CM | POA: Diagnosis not present

## 2018-12-26 DIAGNOSIS — Z48812 Encounter for surgical aftercare following surgery on the circulatory system: Secondary | ICD-10-CM | POA: Diagnosis not present

## 2018-12-26 DIAGNOSIS — Z952 Presence of prosthetic heart valve: Secondary | ICD-10-CM | POA: Diagnosis not present

## 2018-12-26 DIAGNOSIS — I4891 Unspecified atrial fibrillation: Secondary | ICD-10-CM | POA: Diagnosis not present

## 2018-12-26 DIAGNOSIS — I10 Essential (primary) hypertension: Secondary | ICD-10-CM | POA: Diagnosis not present

## 2018-12-26 DIAGNOSIS — Z951 Presence of aortocoronary bypass graft: Secondary | ICD-10-CM | POA: Diagnosis not present

## 2018-12-31 DIAGNOSIS — Z954 Presence of other heart-valve replacement: Secondary | ICD-10-CM | POA: Diagnosis not present

## 2018-12-31 DIAGNOSIS — Z8673 Personal history of transient ischemic attack (TIA), and cerebral infarction without residual deficits: Secondary | ICD-10-CM | POA: Diagnosis not present

## 2018-12-31 DIAGNOSIS — Z79899 Other long term (current) drug therapy: Secondary | ICD-10-CM | POA: Diagnosis not present

## 2018-12-31 DIAGNOSIS — Z7982 Long term (current) use of aspirin: Secondary | ICD-10-CM | POA: Diagnosis not present

## 2019-01-01 DIAGNOSIS — Z7982 Long term (current) use of aspirin: Secondary | ICD-10-CM | POA: Diagnosis not present

## 2019-01-01 DIAGNOSIS — Z8673 Personal history of transient ischemic attack (TIA), and cerebral infarction without residual deficits: Secondary | ICD-10-CM | POA: Diagnosis not present

## 2019-01-01 DIAGNOSIS — Z79899 Other long term (current) drug therapy: Secondary | ICD-10-CM | POA: Diagnosis not present

## 2019-01-01 DIAGNOSIS — Z954 Presence of other heart-valve replacement: Secondary | ICD-10-CM | POA: Diagnosis not present

## 2019-01-06 DIAGNOSIS — Z79899 Other long term (current) drug therapy: Secondary | ICD-10-CM | POA: Diagnosis not present

## 2019-01-06 DIAGNOSIS — Z7982 Long term (current) use of aspirin: Secondary | ICD-10-CM | POA: Diagnosis not present

## 2019-01-06 DIAGNOSIS — Z954 Presence of other heart-valve replacement: Secondary | ICD-10-CM | POA: Diagnosis not present

## 2019-01-06 DIAGNOSIS — Z8673 Personal history of transient ischemic attack (TIA), and cerebral infarction without residual deficits: Secondary | ICD-10-CM | POA: Diagnosis not present

## 2019-01-08 ENCOUNTER — Encounter: Payer: Self-pay | Admitting: Cardiology

## 2019-01-08 ENCOUNTER — Ambulatory Visit (INDEPENDENT_AMBULATORY_CARE_PROVIDER_SITE_OTHER): Payer: Medicare HMO | Admitting: Cardiology

## 2019-01-08 VITALS — BP 116/78 | HR 80 | Ht 67.0 in | Wt 175.0 lb

## 2019-01-08 DIAGNOSIS — Z952 Presence of prosthetic heart valve: Secondary | ICD-10-CM | POA: Diagnosis not present

## 2019-01-08 DIAGNOSIS — Z9889 Other specified postprocedural states: Secondary | ICD-10-CM | POA: Diagnosis not present

## 2019-01-08 DIAGNOSIS — I639 Cerebral infarction, unspecified: Secondary | ICD-10-CM | POA: Diagnosis not present

## 2019-01-08 DIAGNOSIS — Z8679 Personal history of other diseases of the circulatory system: Secondary | ICD-10-CM

## 2019-01-08 DIAGNOSIS — I10 Essential (primary) hypertension: Secondary | ICD-10-CM | POA: Diagnosis not present

## 2019-01-08 DIAGNOSIS — E782 Mixed hyperlipidemia: Secondary | ICD-10-CM | POA: Diagnosis not present

## 2019-01-08 DIAGNOSIS — Z79899 Other long term (current) drug therapy: Secondary | ICD-10-CM | POA: Diagnosis not present

## 2019-01-08 DIAGNOSIS — Z8673 Personal history of transient ischemic attack (TIA), and cerebral infarction without residual deficits: Secondary | ICD-10-CM | POA: Diagnosis not present

## 2019-01-08 DIAGNOSIS — E785 Hyperlipidemia, unspecified: Secondary | ICD-10-CM

## 2019-01-08 DIAGNOSIS — Z954 Presence of other heart-valve replacement: Secondary | ICD-10-CM | POA: Diagnosis not present

## 2019-01-08 DIAGNOSIS — Z7982 Long term (current) use of aspirin: Secondary | ICD-10-CM | POA: Diagnosis not present

## 2019-01-08 DIAGNOSIS — I7101 Dissection of thoracic aorta: Secondary | ICD-10-CM | POA: Diagnosis not present

## 2019-01-08 DIAGNOSIS — I71019 Dissection of thoracic aorta, unspecified: Secondary | ICD-10-CM

## 2019-01-08 HISTORY — DX: Hyperlipidemia, unspecified: E78.5

## 2019-01-08 HISTORY — DX: Essential (primary) hypertension: I10

## 2019-01-08 NOTE — Patient Instructions (Addendum)
Medication Instructions:   Your physician has recommended you make the following change in your medication:   STOP: Furosemide and Potassium    If you need a refill on your cardiac medications before your next appointment, please call your pharmacy.   Lab work:  Your physician recommends that you return for lab work today: BMP, CBC, LFT  In one month you will return fasting for more lab work. BMP, LFT, LIPID.   If you have labs (blood work) drawn today and your tests are completely normal, you will receive your results only by: Marland Kitchen MyChart Message (if you have MyChart) OR . A paper copy in the mail If you have any lab test that is abnormal or we need to change your treatment, we will call you to review the results.  Testing/Procedures:  NONE  Follow-Up: At [facility], you and your health needs are our priority.  As part of our continuing mission to provide you with exceptional heart care, we have created designated Provider Care Teams.  These Care Teams include your primary Cardiologist (physician) and Advanced Practice Providers (APPs -  Physician Assistants and Nurse Practitioners) who all work together to provide you with the care you need, when you need it.  You will need a follow up appointment in 3 months.

## 2019-01-08 NOTE — Progress Notes (Signed)
Cardiology Office Note:    Date:  01/08/2019   ID:  Stacy Jenkins, DOB February 12, 1961, MRN [id number]  PCP:  Stacy Broker, MD  Cardiologist:  Stacy Lindau, MD   Referring MD: Stacy Broker, MD    ASSESSMENT:    1. Cerebrovascular accident (CVA), unspecified mechanism ([place])   2. Dissection of thoracic aorta ([place])   3. Mixed hyperlipidemia   4. Essential hypertension   5. History of atrial fibrillation   6. History of CVA (cerebrovascular accident)   7. S/P AVR   8. S/P ascending aortic aneurysm repair   9. Mixed dyslipidemia    PLAN:    In order of problems listed above:  1. Secondary prevention stressed with the patient.  Importance of compliance with diet and medication stressed and she vocalized understanding. 2. She has had atrial fibrillation and stroke so she is on anticoagulation.I discussed with the patient atrial fibrillation, disease process. Management and therapy including rate and rhythm control, anticoagulation benefits and potential risks were discussed extensively with the patient. Patient had multiple questions which were answered to patient's satisfaction. 3. I will have a Chem-7 CBC and liver panel done today.  She will be back in a month for a Chem-7 liver lipid check 4. Importance of regular exercise stressed 5. Patient will be seen in follow-up appointment in 3 months or earlier if the patient has any concerns    Medication Adjustments/Labs and Tests Ordered: Current medicines are reviewed at length with the patient today.  Concerns regarding medicines are outlined above.  No orders of the defined types were placed in this encounter.  No orders of the defined types were placed in this encounter.    History of Present Illness:    Stacy Jenkins is a 58 y.o. female who is being seen today for the evaluation of aortic valve replacement and repair of the root and thoracic aorta at the request of Stacy Broker, MD.  Patient is a pleasant  58 year old female.  She has past medical history of aortic aneurysm post repair.  Subsequently she developed repeat aneurysm and aortic valve regurgitation and therefore went to [facility] for reparative surgery and valve replacement.  She developed atrial fibrillation and had a stroke at that time.  Subsequently she has done well.  No chest pain orthopnea or PND.  She is recovering well from her surgery and is still going to [facility] but soon is going to discontinue follow-up with her and is going to see me in follow-up.  She is to see a geneticist at [facility] for follow-up also.  She is ambulating well and getting rehab therapy.  Past Medical History:  Diagnosis Date  . Stroke [facility])     Past Surgical History:  Procedure Laterality Date  . aortic valve surgery  02/2016  . TEE WITHOUT CARDIOVERSION N/A 07/16/2017   Procedure: TRANSESOPHAGEAL ECHOCARDIOGRAM (TEE);  Surgeon: Sanda Klein, MD;  Location: [facility] END[facility];  Service: Cardiovascular;  Laterality: N/A;    Current Medications: Current Meds  Medication Sig  . apixaban (ELIQUIS) 5 MG TABS tablet Take 1 tablet (5 mg total) by mouth 2 (two) times daily.  Marland Kitchen aspirin EC 81 MG tablet Take 81 mg by mouth daily.  . Coenzyme Q-10 200 MG CAPS Take 1 capsule by mouth daily.  . furosemide (LASIX) 20 MG tablet Take 20 mg by mouth 2 (two) times daily.  . metoprolol tartrate (LOPRESSOR) 25 MG tablet Take 1 tablet by mouth 2 (two) times daily.  Marland Kitchen  Multiple Vitamin (MULTIVITAMIN) capsule Take by mouth.  . Potassium Chloride ER 20 MEQ TBCR Take 1 tablet by mouth 2 (two) times daily with a meal.     Allergies:   Penicillin g   Social History   Socioeconomic History  . Marital status: Married    Spouse name: Not on file  . Number of children: Not on file  . Years of education: Not on file  . Highest education level: Not on file  Occupational History  . Not on file  Social Needs  . Financial resource strain: Not on file  . Food insecurity:    Worry: Not  on file    Inability: Not on file  . Transportation needs:    Medical: Not on file    Non-medical: Not on file  Tobacco Use  . Smoking status: Never Smoker  . Smokeless tobacco: Never Used  Substance and Sexual Activity  . Alcohol use: No  . Drug use: No  . Sexual activity: Not on file  Lifestyle  . Physical activity:    Days per week: Not on file    Minutes per session: Not on file  . Stress: Not on file  Relationships  . Social connections:    Talks on phone: Not on file    Gets together: Not on file    Attends religious service: Not on file    Active member of club or organization: Not on file    Attends meetings of clubs or organizations: Not on file    Relationship status: Not on file  Other Topics Concern  . Not on file  Social History Narrative  . Not on file     Family History: The patient's family history is not on file.  ROS:   Please see the history of present illness.    All other systems reviewed and are negative.  EKGs/Labs/Other Studies Reviewed:    The following studies were reviewed today: I reviewed records extensively and discussed with the patient at length.   Recent Labs: No results found for requested labs within last 8760 hours.  Recent Lipid Panel    Component Value Date/Time   CHOL 142 07/14/2017 0241   TRIG 94 07/14/2017 0241   HDL 53 07/14/2017 0241   CHOLHDL 2.7 07/14/2017 0241   VLDL 19 07/14/2017 0241   LDLCALC 70 07/14/2017 0241    Physical Exam:    VS:  BP 116/78 (BP Location: Right Arm, Patient Position: Sitting, Cuff Size: Normal)   Pulse 80   Ht 5\' 7"  (1.702 m)   Wt 175 lb (79.4 kg)   SpO2 96%   BMI 27.41 kg/m     Wt Readings from Last 3 Encounters:  01/08/19 175 lb (79.4 kg)  08/23/17 171 lb (77.6 kg)  07/16/17 170 lb (77.1 kg)     GEN: Patient is in no acute distress HEENT: Normal NECK: No JVD; No carotid bruits LYMPHATICS: No lymphadenopathy CARDIAC: S1 S2 regular, 2/6 systolic murmur at the  apex. RESPIRATORY:  Clear to auscultation without rales, wheezing or rhonchi  ABDOMEN: Soft, non-tender, non-distended MUSCULOSKELETAL:  No edema; No deformity  SKIN: Warm and dry NEUROLOGIC:  Alert and oriented x 3 PSYCHIATRIC:  Normal affect    Signed, Stacy Lindau, MD  01/08/2019 2:42 PM    [facility]

## 2019-01-09 ENCOUNTER — Telehealth: Payer: Self-pay

## 2019-01-09 LAB — CBC
Hematocrit: 32.6 % — ABNORMAL LOW (ref 34.0–46.6)
Hemoglobin: 10.8 g/dL — ABNORMAL LOW (ref 11.1–15.9)
MCH: 30.4 pg (ref 26.6–33.0)
MCHC: 33.1 g/dL (ref 31.5–35.7)
MCV: 92 fL (ref 79–97)
Platelets: 173 10*3/uL (ref 150–450)
RBC: 3.55 x10E6/uL — ABNORMAL LOW (ref 3.77–5.28)
RDW: 12.3 % (ref 11.7–15.4)
WBC: 3.4 10*3/uL (ref 3.4–10.8)

## 2019-01-09 LAB — BASIC METABOLIC PANEL
BUN/Creatinine Ratio: 26 — ABNORMAL HIGH (ref 9–23)
BUN: 21 mg/dL (ref 6–24)
CO2: 22 mmol/L (ref 20–29)
Calcium: 9.3 mg/dL (ref 8.7–10.2)
Chloride: 103 mmol/L (ref 96–106)
Creatinine, Ser: 0.8 mg/dL (ref 0.57–1.00)
GFR calc Af Amer: 94 mL/min/{1.73_m2} (ref >59)
GFR calc non Af Amer: 82 mL/min/{1.73_m2} (ref >59)
Glucose: 84 mg/dL (ref 65–99)
Potassium: 4.2 mmol/L (ref 3.5–5.2)
Sodium: 141 mmol/L (ref 134–144)

## 2019-01-09 LAB — HEPATIC FUNCTION PANEL
ALK PHOS: 104 IU/L (ref 39–117)
ALT: 17 IU/L (ref 0–32)
AST: 24 IU/L (ref 0–40)
Albumin: 4.2 g/dL (ref 3.5–5.5)
Bilirubin Total: 0.3 mg/dL (ref 0.0–1.2)
Bilirubin, Direct: 0.11 mg/dL (ref 0.00–0.40)
Total Protein: 7 g/dL (ref 6.0–8.5)

## 2019-01-09 NOTE — Telephone Encounter (Signed)
-----   Message from [name], MD sent at 01/09/2019 11:52 AM EST ----- The results of the study is unremarkable. Please inform patient. I will discuss in detail at next appointment. Cc  primary care/referring physician.  Mild anemia and this needs to be followed by primary care. [name], MD 01/09/2019 11:51 AM

## 2019-01-09 NOTE — Telephone Encounter (Signed)
Called and left detailed voice message on patients phone regarding lab results. 

## 2019-01-09 NOTE — Addendum Note (Signed)
Addended by: Orland Penman on: 01/09/2019 08:05 AM   Modules accepted: Orders

## 2019-01-10 DIAGNOSIS — Z954 Presence of other heart-valve replacement: Secondary | ICD-10-CM | POA: Diagnosis not present

## 2019-01-10 DIAGNOSIS — Z8673 Personal history of transient ischemic attack (TIA), and cerebral infarction without residual deficits: Secondary | ICD-10-CM | POA: Diagnosis not present

## 2019-01-10 DIAGNOSIS — Z79899 Other long term (current) drug therapy: Secondary | ICD-10-CM | POA: Diagnosis not present

## 2019-01-10 DIAGNOSIS — Z7982 Long term (current) use of aspirin: Secondary | ICD-10-CM | POA: Diagnosis not present

## 2019-01-13 DIAGNOSIS — Z7982 Long term (current) use of aspirin: Secondary | ICD-10-CM | POA: Diagnosis not present

## 2019-01-13 DIAGNOSIS — Z8673 Personal history of transient ischemic attack (TIA), and cerebral infarction without residual deficits: Secondary | ICD-10-CM | POA: Diagnosis not present

## 2019-01-13 DIAGNOSIS — Z954 Presence of other heart-valve replacement: Secondary | ICD-10-CM | POA: Diagnosis not present

## 2019-01-13 DIAGNOSIS — Z79899 Other long term (current) drug therapy: Secondary | ICD-10-CM | POA: Diagnosis not present

## 2019-01-17 DIAGNOSIS — Z8673 Personal history of transient ischemic attack (TIA), and cerebral infarction without residual deficits: Secondary | ICD-10-CM | POA: Diagnosis not present

## 2019-01-17 DIAGNOSIS — Z79899 Other long term (current) drug therapy: Secondary | ICD-10-CM | POA: Diagnosis not present

## 2019-01-17 DIAGNOSIS — Z7982 Long term (current) use of aspirin: Secondary | ICD-10-CM | POA: Diagnosis not present

## 2019-01-17 DIAGNOSIS — Z954 Presence of other heart-valve replacement: Secondary | ICD-10-CM | POA: Diagnosis not present

## 2019-01-20 DIAGNOSIS — Z7982 Long term (current) use of aspirin: Secondary | ICD-10-CM | POA: Diagnosis not present

## 2019-01-20 DIAGNOSIS — Z954 Presence of other heart-valve replacement: Secondary | ICD-10-CM | POA: Diagnosis not present

## 2019-01-20 DIAGNOSIS — Z8673 Personal history of transient ischemic attack (TIA), and cerebral infarction without residual deficits: Secondary | ICD-10-CM | POA: Diagnosis not present

## 2019-01-20 DIAGNOSIS — Z79899 Other long term (current) drug therapy: Secondary | ICD-10-CM | POA: Diagnosis not present

## 2019-01-21 ENCOUNTER — Other Ambulatory Visit: Payer: Self-pay

## 2019-01-21 ENCOUNTER — Telehealth: Payer: Self-pay | Admitting: Cardiology

## 2019-01-21 MED ORDER — APIXABAN 5 MG PO TABS: 5.0000 mg | ORAL_TABLET | Freq: Two times a day (BID) | ORAL | 6 refills | Status: AC

## 2019-01-21 MED ORDER — METOPROLOL TARTRATE 25 MG PO TABS: 25.0000 mg | ORAL_TABLET | Freq: Two times a day (BID) | ORAL | 6 refills | Status: DC

## 2019-01-21 MED ORDER — PRAVASTATIN SODIUM 40 MG PO TABS: 40.0000 mg | ORAL_TABLET | Freq: Every day | ORAL | 6 refills | Status: DC

## 2019-01-21 NOTE — Telephone Encounter (Signed)
Patient needs her cardiac meds called to the [facility] in [place] , patient states we were supposed to last week..   Metoprolol , Eliquis and Pravastatin  She is out and needs to today!!

## 2019-01-22 DIAGNOSIS — Z7982 Long term (current) use of aspirin: Secondary | ICD-10-CM | POA: Diagnosis not present

## 2019-01-22 DIAGNOSIS — Z8673 Personal history of transient ischemic attack (TIA), and cerebral infarction without residual deficits: Secondary | ICD-10-CM | POA: Diagnosis not present

## 2019-01-22 DIAGNOSIS — Z954 Presence of other heart-valve replacement: Secondary | ICD-10-CM | POA: Diagnosis not present

## 2019-01-22 DIAGNOSIS — Z79899 Other long term (current) drug therapy: Secondary | ICD-10-CM | POA: Diagnosis not present

## 2019-01-24 DIAGNOSIS — Z954 Presence of other heart-valve replacement: Secondary | ICD-10-CM | POA: Diagnosis not present

## 2019-01-24 DIAGNOSIS — Z79899 Other long term (current) drug therapy: Secondary | ICD-10-CM | POA: Diagnosis not present

## 2019-01-24 DIAGNOSIS — Z7982 Long term (current) use of aspirin: Secondary | ICD-10-CM | POA: Diagnosis not present

## 2019-01-24 DIAGNOSIS — Z8673 Personal history of transient ischemic attack (TIA), and cerebral infarction without residual deficits: Secondary | ICD-10-CM | POA: Diagnosis not present

## 2019-01-27 DIAGNOSIS — Z952 Presence of prosthetic heart valve: Secondary | ICD-10-CM | POA: Diagnosis not present

## 2019-01-29 DIAGNOSIS — Z952 Presence of prosthetic heart valve: Secondary | ICD-10-CM | POA: Diagnosis not present

## 2019-02-05 DIAGNOSIS — Z952 Presence of prosthetic heart valve: Secondary | ICD-10-CM | POA: Diagnosis not present

## 2019-02-05 IMAGING — MR MR HEAD W/O CM
9 of 10 series · 38 of 48 positions shown · non-contrast
Comparison: None.

CLINICAL DATA: Followup tPA treatment.  Stroke presentation.

EXAM:
MRI HEAD WITHOUT CONTRAST
TECHNIQUE: Multiplanar, multiecho pulse sequences of the brain and surrounding
structures were obtained without intravenous contrast.

[Series 3: DWI · axial · 3.0mm · 0.94mm/px · z∈[-72,+73]mm · 8 of 100 slices shown (1 of 2)]
[im 1/100]
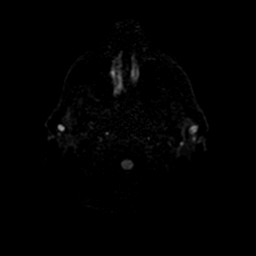
[im 15/100]
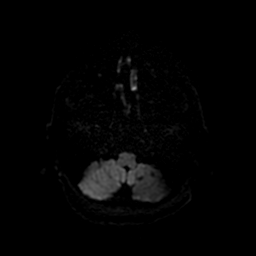
[im 29/100]
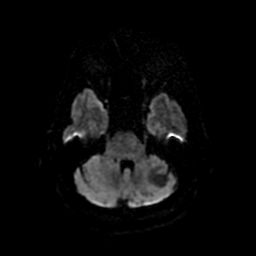
[im 43/100]
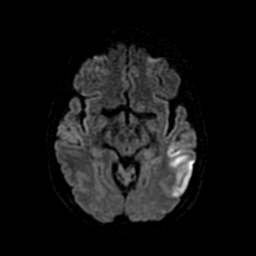
[im 57/100]
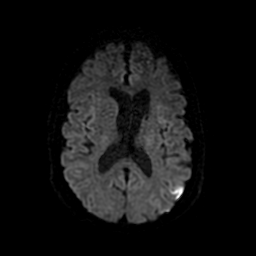
[im 71/100]
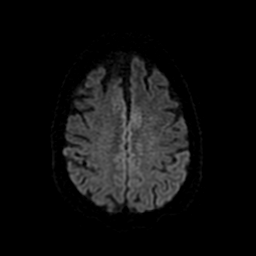
[im 85/100]
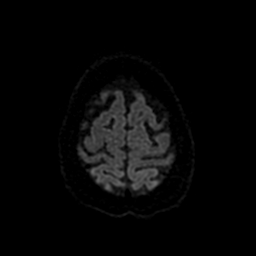
[im 100/100]
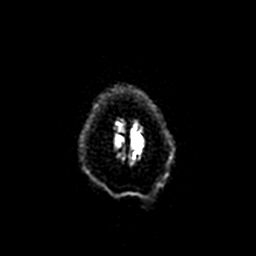

[Series 4: FLAIR · sagittal · 5.0mm · 0.47mm/px · 2 of 23 slices shown (1 of 2)]
[im 1/23]
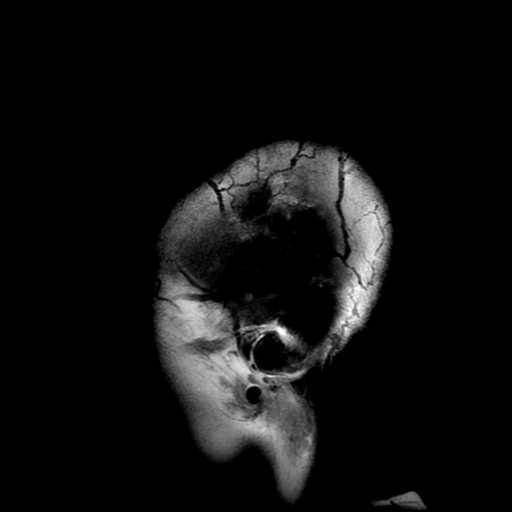
[im 23/23]
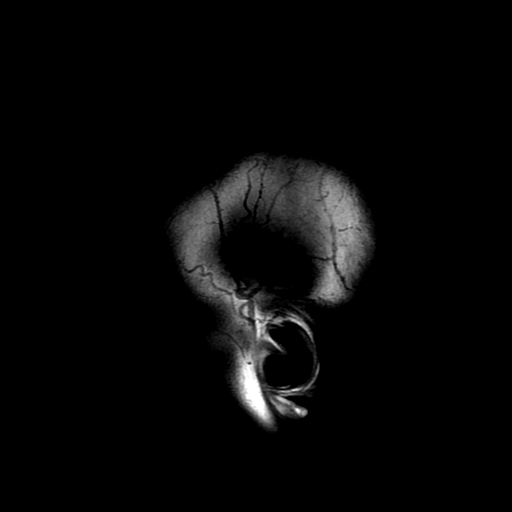

[Series 5: DWI · coronal · 4.0mm · 0.94mm/px · 7 of 76 slices shown (2 of 2)]
[im 1/76]
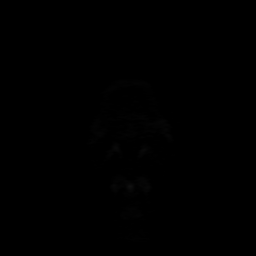
[im 13/76]
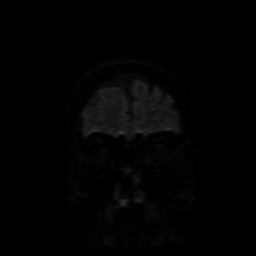
[im 26/76]
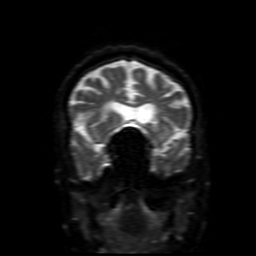
[im 38/76]
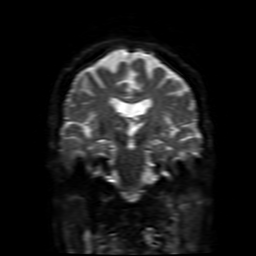
[im 51/76]
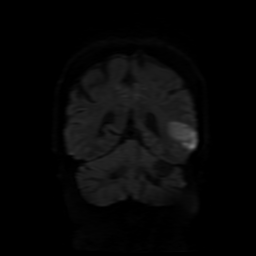
[im 63/76]
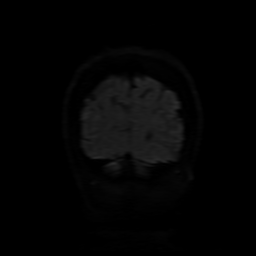
[im 76/76]
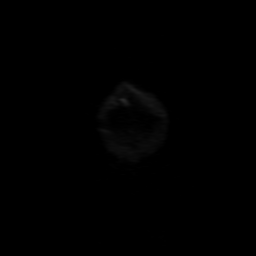

[Series 7: T2 · axial · 5.0mm · 0.47mm/px · z∈[-90,+69]mm · 3 of 28 slices shown (1 of 2)]
[im 1/28]
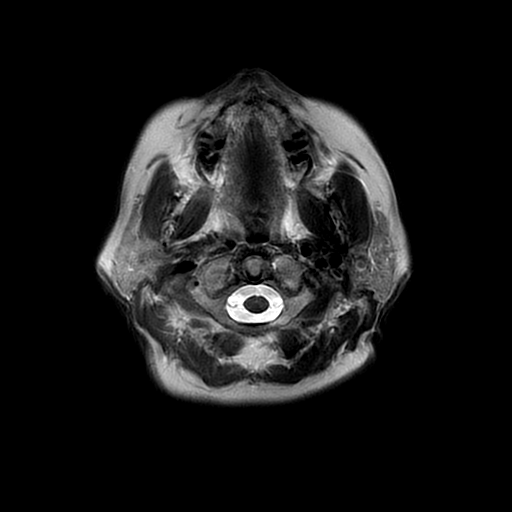
[im 14/28]
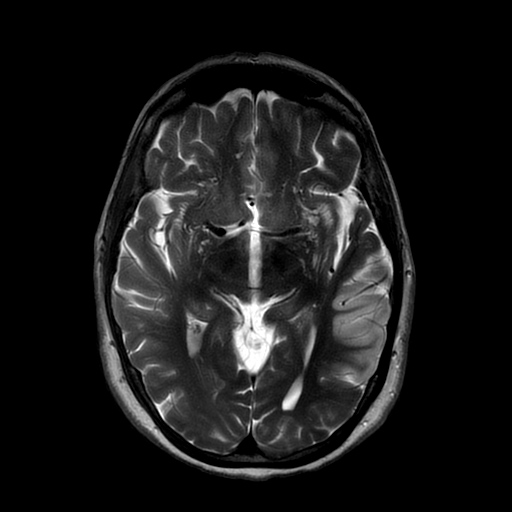
[im 28/28]
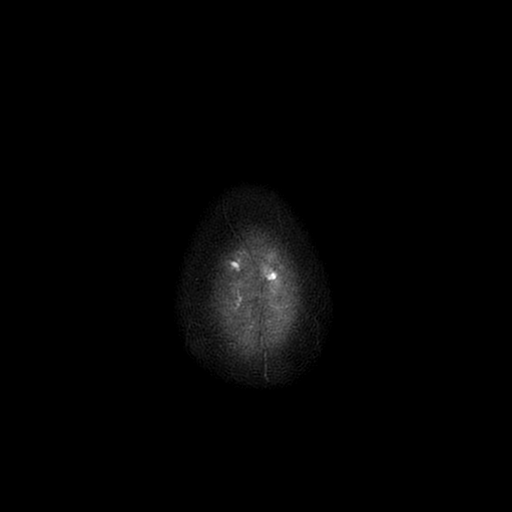

[Series 8: FLAIR · axial · 5.0mm · 0.47mm/px · z∈[-90,+69]mm · 3 of 28 slices shown (2 of 2)]
[im 1/28]
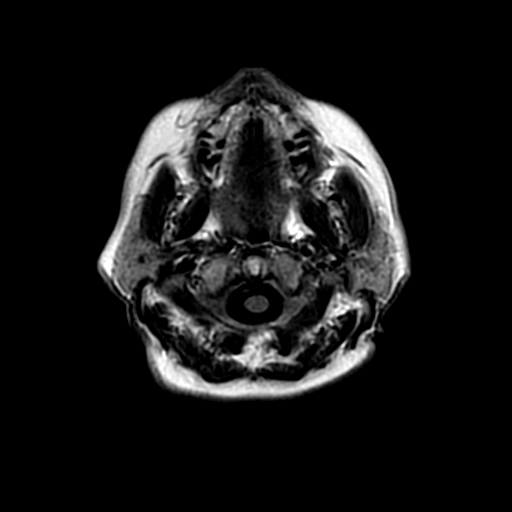
[im 14/28]
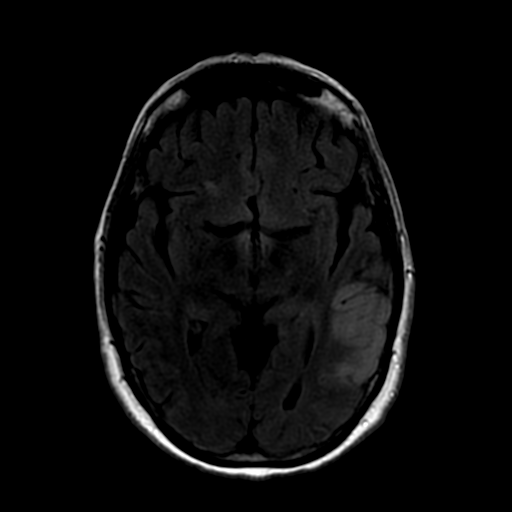
[im 28/28]
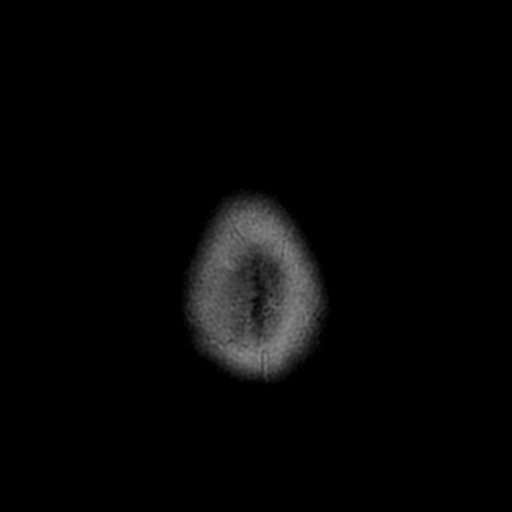

[Series 9: ax 3(person_name) · axial · 3.0mm · 0.94mm/px · z∈[-88,+74]mm · 5 of 56 slices shown]
[im 1/56]
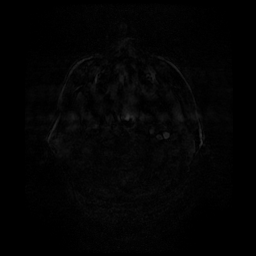
[im 14/56]
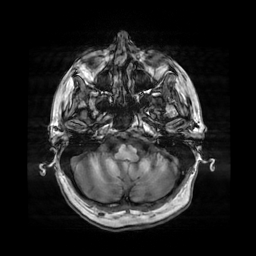
[im 28/56]
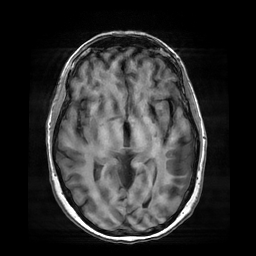
[im 42/56]
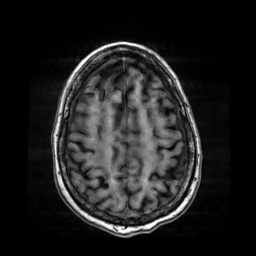
[im 56/56]
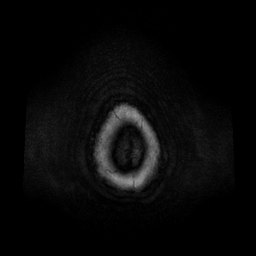

[Series 11: T2 · coronal · 5.0mm · 0.47mm/px · 2 of 25 slices shown (2 of 2)]
[im 1/25]
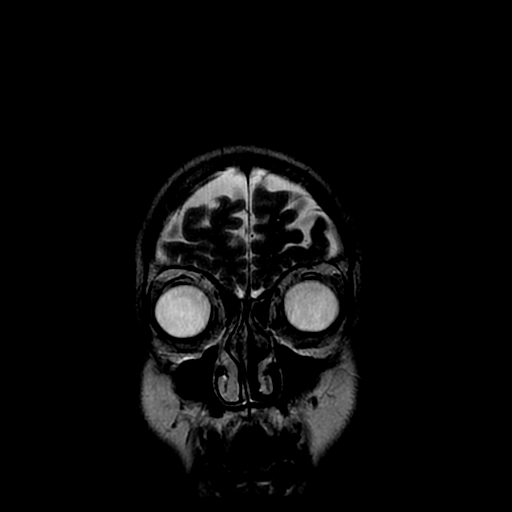
[im 25/25]
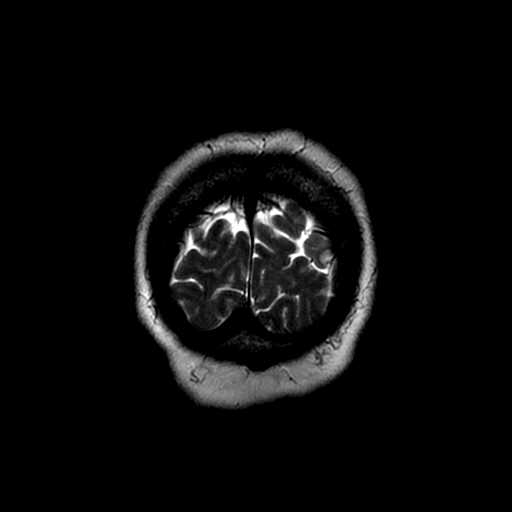

[Series 350: ADC · axial · 3.0mm · 0.94mm/px · z∈[-72,+73]mm · 5 of 50 slices shown (1 of 2)]
[im 1/50]
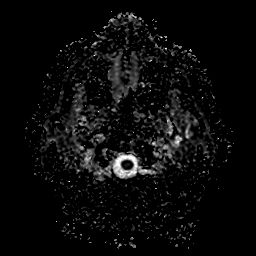
[im 13/50]
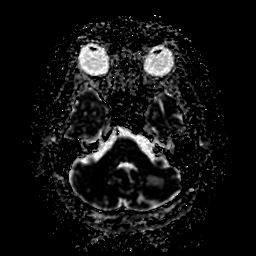
[im 25/50]
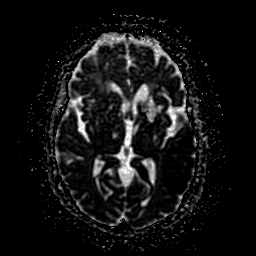
[im 37/50]
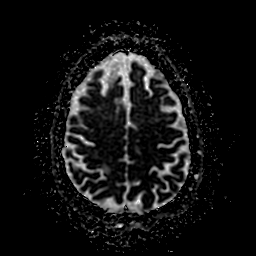
[im 50/50]
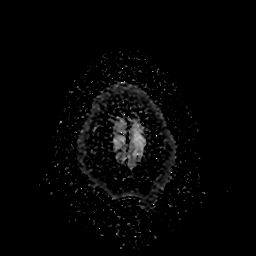

[Series 550: ADC · coronal · 4.0mm · 0.94mm/px · 3 of 38 slices shown (2 of 2)]
[im 1/38]
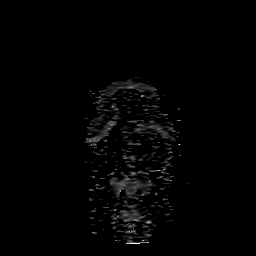
[im 19/38]
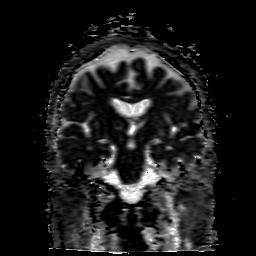
[im 38/38]
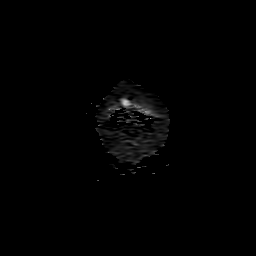

[38 of 48 positions shown; findings below may reference images not displayed]

FINDINGS: Brain: 6 cm region of acute infarction in the left posterior
temporoparietal junction. Mild swelling but no acute hemorrhage. No
other region of acute infarction. Old cerebellar infarctions, more
extensive on the left than the right. The brainstem is negative.
There are old small vessel infarctions affecting the thalami. Old
bilateral basal ganglia infarction more extensive on the left than
the right with hemosiderin deposition. Chronic small-vessel ischemic
changes of the cerebral hemispheric deep and subcortical white
matter with scattered foci of hemosiderin deposition. No mass
lesion, hydrocephalus or extra-axial collection.

Vascular: Major vessels at the base of the brain show flow.

Skull and upper cervical spine: Negative

Sinuses/Orbits: Clear/normal

Other: None significant
IMPRESSION: 6 cm region of acute infarction at the left posterior temporal/
parietal junction. Mild swelling but no hemorrhage or mass effect
appear

Extensive old ischemic changes throughout the brain as outlined
above.

## 2019-02-07 DIAGNOSIS — Z952 Presence of prosthetic heart valve: Secondary | ICD-10-CM | POA: Diagnosis not present

## 2019-02-10 DIAGNOSIS — Z952 Presence of prosthetic heart valve: Secondary | ICD-10-CM | POA: Diagnosis not present

## 2019-02-12 DIAGNOSIS — Z952 Presence of prosthetic heart valve: Secondary | ICD-10-CM | POA: Diagnosis not present

## 2019-02-17 DIAGNOSIS — Z8673 Personal history of transient ischemic attack (TIA), and cerebral infarction without residual deficits: Secondary | ICD-10-CM | POA: Diagnosis not present

## 2019-02-17 DIAGNOSIS — E782 Mixed hyperlipidemia: Secondary | ICD-10-CM | POA: Diagnosis not present

## 2019-02-17 DIAGNOSIS — I429 Cardiomyopathy, unspecified: Secondary | ICD-10-CM | POA: Diagnosis not present

## 2019-02-17 DIAGNOSIS — Z0000 Encounter for general adult medical examination without abnormal findings: Secondary | ICD-10-CM

## 2019-02-17 DIAGNOSIS — Z1211 Encounter for screening for malignant neoplasm of colon: Secondary | ICD-10-CM | POA: Diagnosis not present

## 2019-02-17 DIAGNOSIS — I10 Essential (primary) hypertension: Secondary | ICD-10-CM | POA: Diagnosis not present

## 2019-02-17 DIAGNOSIS — Z8679 Personal history of other diseases of the circulatory system: Secondary | ICD-10-CM | POA: Diagnosis not present

## 2019-02-17 HISTORY — DX: Encounter for general adult medical examination without abnormal findings: Z00.00

## 2019-02-19 DIAGNOSIS — Z952 Presence of prosthetic heart valve: Secondary | ICD-10-CM | POA: Diagnosis not present

## 2019-02-21 DIAGNOSIS — Z952 Presence of prosthetic heart valve: Secondary | ICD-10-CM | POA: Diagnosis not present

## 2019-03-03 DIAGNOSIS — Z952 Presence of prosthetic heart valve: Secondary | ICD-10-CM | POA: Diagnosis not present

## 2019-03-05 DIAGNOSIS — Z952 Presence of prosthetic heart valve: Secondary | ICD-10-CM | POA: Diagnosis not present

## 2019-03-07 DIAGNOSIS — Z952 Presence of prosthetic heart valve: Secondary | ICD-10-CM | POA: Diagnosis not present

## 2019-03-10 DIAGNOSIS — Z952 Presence of prosthetic heart valve: Secondary | ICD-10-CM | POA: Diagnosis not present

## 2019-04-22 ENCOUNTER — Telehealth: Payer: Self-pay | Admitting: *Deleted

## 2019-04-22 NOTE — Telephone Encounter (Signed)
Pt wants to wait until can be seen in the office. Is not having any symptoms or problems at the moment.

## 2019-09-12 DIAGNOSIS — I340 Nonrheumatic mitral (valve) insufficiency: Secondary | ICD-10-CM | POA: Diagnosis not present

## 2019-09-12 DIAGNOSIS — Z8679 Personal history of other diseases of the circulatory system: Secondary | ICD-10-CM | POA: Diagnosis not present

## 2019-09-12 DIAGNOSIS — I712 Thoracic aortic aneurysm, without rupture: Secondary | ICD-10-CM | POA: Diagnosis not present

## 2019-09-12 DIAGNOSIS — M438X4 Other specified deforming dorsopathies, thoracic region: Secondary | ICD-10-CM | POA: Diagnosis not present

## 2019-09-12 DIAGNOSIS — Z09 Encounter for follow-up examination after completed treatment for conditions other than malignant neoplasm: Secondary | ICD-10-CM | POA: Diagnosis not present

## 2019-09-12 DIAGNOSIS — Z952 Presence of prosthetic heart valve: Secondary | ICD-10-CM | POA: Diagnosis not present

## 2019-09-12 DIAGNOSIS — R911 Solitary pulmonary nodule: Secondary | ICD-10-CM | POA: Diagnosis not present

## 2019-09-12 DIAGNOSIS — Z9889 Other specified postprocedural states: Secondary | ICD-10-CM | POA: Diagnosis not present

## 2019-09-16 DIAGNOSIS — I7103 Dissection of thoracoabdominal aorta: Secondary | ICD-10-CM | POA: Diagnosis not present

## 2019-10-01 ENCOUNTER — Other Ambulatory Visit: Payer: Self-pay | Admitting: Cardiology

## 2019-10-30 ENCOUNTER — Ambulatory Visit (INDEPENDENT_AMBULATORY_CARE_PROVIDER_SITE_OTHER): Payer: Medicare HMO | Admitting: Cardiology

## 2019-10-30 ENCOUNTER — Encounter: Payer: Self-pay | Admitting: Cardiology

## 2019-10-30 ENCOUNTER — Other Ambulatory Visit: Payer: Self-pay

## 2019-10-30 VITALS — BP 104/78 | HR 64 | Ht 67.0 in | Wt 176.0 lb

## 2019-10-30 DIAGNOSIS — Z8679 Personal history of other diseases of the circulatory system: Secondary | ICD-10-CM | POA: Diagnosis not present

## 2019-10-30 DIAGNOSIS — I639 Cerebral infarction, unspecified: Secondary | ICD-10-CM | POA: Diagnosis not present

## 2019-10-30 DIAGNOSIS — I10 Essential (primary) hypertension: Secondary | ICD-10-CM | POA: Diagnosis not present

## 2019-10-30 DIAGNOSIS — E782 Mixed hyperlipidemia: Secondary | ICD-10-CM

## 2019-10-30 DIAGNOSIS — I712 Thoracic aortic aneurysm, without rupture: Secondary | ICD-10-CM | POA: Diagnosis not present

## 2019-10-30 DIAGNOSIS — Z952 Presence of prosthetic heart valve: Secondary | ICD-10-CM

## 2019-10-30 DIAGNOSIS — Z8673 Personal history of transient ischemic attack (TIA), and cerebral infarction without residual deficits: Secondary | ICD-10-CM | POA: Diagnosis not present

## 2019-10-30 DIAGNOSIS — I7121 Aneurysm of the ascending aorta, without rupture: Secondary | ICD-10-CM

## 2019-10-30 DIAGNOSIS — I9789 Other postprocedural complications and disorders of the circulatory system, not elsewhere classified: Secondary | ICD-10-CM | POA: Diagnosis not present

## 2019-10-30 DIAGNOSIS — Z9889 Other specified postprocedural states: Secondary | ICD-10-CM

## 2019-10-30 DIAGNOSIS — I4891 Unspecified atrial fibrillation: Secondary | ICD-10-CM

## 2019-10-30 MED ORDER — METOPROLOL SUCCINATE ER 50 MG PO TB24: 50.0000 mg | ORAL_TABLET | Freq: Every day | ORAL | 3 refills | Status: DC

## 2019-10-30 NOTE — Progress Notes (Signed)
Cardiology Office Note:    Date:  10/30/2019   ID:  Stacy Jenkins, DOB 11-08-1961, MRN [id number]  PCP:  [name], MD  Cardiologist:  [name], MD   Referring MD: [name], MD    ASSESSMENT:    1. Cerebrovascular accident (CVA), unspecified mechanism ([place])   2. Ascending aortic aneurysm ([place])   3. Benign essential hypertension   4. Mixed hyperlipidemia   5. Postoperative atrial fibrillation (HCC)   6. S/P AVR   7. S/P ascending aortic aneurysm repair   8. Mixed dyslipidemia   9. History of CVA (cerebrovascular accident)    PLAN:    In order of problems listed above:  1. Cardiomyopathy: I discussed my findings with the patient at extensive length.  Her ejection fraction is about 35%.  EKG was reviewed.  Patient is keen about initiating Entresto however her blood pressure is borderline.  Her son is a Librarian, academic and I want to discuss her case with him at length per the request of the patient and her husband.  Currently patient will continue current medications.  As mentioned above the EKG was within normal limits and the QRS is normal.  I think if we are going to initiate Entresto then we will have to lower the dose of metoprolol or hold it for now.  I had this discussion with the patient at extensive length and she vocalized understanding.   Medication Adjustments/Labs and Tests Ordered: Current medicines are reviewed at length with the patient today.  Concerns regarding medicines are outlined above.  No orders of the defined types were placed in this encounter.  No orders of the defined types were placed in this encounter.    Chief Complaint  Patient presents with  . Follow-up     History of Present Illness:    Stacy Jenkins is a 58 y.o. female.  Patient has past medical history of cardiac surgery post aortic valve replacement.  She is had a sutureless stent valve at [facility].  The details are mentioned in her records and I  reviewed them.  She is also had aortic repair for aneurysm in the past on a separate surgery.  She denies any problems at this time.  She takes care of activities of daily living.  She has some degree of fatigue with exertion.  At the time of my evaluation, the patient is alert awake oriented and in no distress.  Patient mentions to me that she was at [facility] and her surgeon mentioned to her about the possibility of electrophysiology evaluation and therefore she is here.  Her ejection fraction is mentioned to be 35%.  At the time of my evaluation, the patient is alert awake oriented and in no distress.  Past Medical History:  Diagnosis Date  . Stroke [facility])     Past Surgical History:  Procedure Laterality Date  . aortic valve surgery  02/2016  . TEE WITHOUT CARDIOVERSION N/A 07/16/2017   Procedure: TRANSESOPHAGEAL ECHOCARDIOGRAM (TEE);  Surgeon: Sanda Klein, MD;  Location: [facility] ENDOSCOPY;  Service: Cardiovascular;  Laterality: N/A;    Current Medications: Current Meds  Medication Sig  . apixaban (ELIQUIS) 5 MG TABS tablet Take 1 tablet (5 mg total) by mouth 2 (two) times daily.  Marland Kitchen aspirin EC 81 MG tablet Take 81 mg by mouth daily.  . Coenzyme Q-10 200 MG CAPS Take 1 capsule by mouth daily.  . metoprolol tartrate (LOPRESSOR) 25 MG tablet Take 1 tablet by mouth twice  daily  . Multiple Vitamin (MULTIVITAMIN) capsule Take by mouth.  . pravastatin (PRAVACHOL) 40 MG tablet Take 1 tablet (40 mg total) by mouth daily.     Allergies:   Penicillin g   Social History   Socioeconomic History  . Marital status: Married    Spouse name: Not on file  . Number of children: Not on file  . Years of education: Not on file  . Highest education level: Not on file  Occupational History  . Not on file  Social Needs  . Financial resource strain: Not on file  . Food insecurity    Worry: Not on file    Inability: Not on file  . Transportation needs    Medical: Not on file    Non-medical: Not  on file  Tobacco Use  . Smoking status: Never Smoker  . Smokeless tobacco: Never Used  Substance and Sexual Activity  . Alcohol use: No  . Drug use: No  . Sexual activity: Not on file  Lifestyle  . Physical activity    Days per week: Not on file    Minutes per session: Not on file  . Stress: Not on file  Relationships  . Social Herbalist on phone: Not on file    Gets together: Not on file    Attends religious service: Not on file    Active member of club or organization: Not on file    Attends meetings of clubs or organizations: Not on file    Relationship status: Not on file  Other Topics Concern  . Not on file  Social History Narrative  . Not on file     Family History: The patient's family history includes Heart attack in her father; Heart disease in her brother and father; Hypertension in her mother.  ROS:   Please see the history of present illness.    All other systems reviewed and are negative.  EKGs/Labs/Other Studies Reviewed:    The following studies were reviewed today: EKG reveals sinus rhythm and nonspecific ST-T changes   Recent Labs: 01/08/2019: ALT 17; BUN 21; Creatinine, Ser 0.80; Hemoglobin 10.8; Platelets 173; Potassium 4.2; Sodium 141  Recent Lipid Panel    Component Value Date/Time   CHOL 142 07/14/2017 0241   TRIG 94 07/14/2017 0241   HDL 53 07/14/2017 0241   CHOLHDL 2.7 07/14/2017 0241   VLDL 19 07/14/2017 0241   LDLCALC 70 07/14/2017 0241    Physical Exam:    VS:  BP 104/78 (BP Location: Left Arm, Patient Position: Sitting, Cuff Size: Normal)   Pulse 64   Ht 5\' 7"  (1.702 m)   Wt 176 lb (79.8 kg)   SpO2 94%   BMI 27.57 kg/m     Wt Readings from Last 3 Encounters:  10/30/19 176 lb (79.8 kg)  01/08/19 175 lb (79.4 kg)  08/23/17 171 lb (77.6 kg)     GEN: Patient is in no acute distress HEENT: Normal NECK: No JVD; No carotid bruits LYMPHATICS: No lymphadenopathy CARDIAC: Hear sounds regular, 2/6 systolic murmur at  the apex. RESPIRATORY:  Clear to auscultation without rales, wheezing or rhonchi  ABDOMEN: Soft, non-tender, non-distended MUSCULOSKELETAL:  No edema; No deformity  SKIN: Warm and dry NEUROLOGIC:  Alert and oriented x 3 PSYCHIATRIC:  Normal affect   Signed, [name], MD  10/30/2019 9:59 AM    [place]

## 2019-10-30 NOTE — Patient Instructions (Signed)
Medication Instructions:  Your physician has recommended you make the following change in your medication: START taking metoprolol succ 50 mg (1 tablet) once daily  STOP taking metoprolol tartrate  *If you need a refill on your cardiac medications before your next appointment, please call your pharmacy*  Lab Work: NONE If you have labs (blood work) drawn today and your tests are completely normal, you will receive your results only by: Marland Kitchen MyChart Message (if you have MyChart) OR . A paper copy in the mail If you have any lab test that is abnormal or we need to change your treatment, we will call you to review the results.  Testing/Procedures: You had an EKG performed today  Follow-Up: At [facility], you and your health needs are our priority.  As part of our continuing mission to provide you with exceptional heart care, we have created designated Provider Care Teams.  These Care Teams include your primary Cardiologist (physician) and Advanced Practice Providers (APPs -  Physician Assistants and Nurse Practitioners) who all work together to provide you with the care you need, when you need it.  Your next appointment:   3 months  The format for your next appointment:   In Person  Provider:   Jyl Heinz, MD  Other Instructions Metoprolol extended-release tablets What is this medicine? METOPROLOL (me TOE proe lole) is a beta-blocker. Beta-blockers reduce the workload on the heart and help it to beat more regularly. This medicine is used to treat high blood pressure and to prevent chest pain. It is also used to after a heart attack and to prevent an additional heart attack from occurring. This medicine may be used for other purposes; ask your health care provider or pharmacist if you have questions. COMMON BRAND NAME(S): toprol, Toprol XL What should I tell my health care provider before I take this medicine? They need to know if you have any of these  conditions:  diabetes  heart or vessel disease like slow heart rate, worsening heart failure, heart block, sick sinus syndrome or Raynaud's disease  kidney disease  liver disease  lung or breathing disease, like asthma or emphysema  pheochromocytoma  thyroid disease  an unusual or allergic reaction to metoprolol, other beta-blockers, medicines, foods, dyes, or preservatives  pregnant or trying to get pregnant  breast-feeding How should I use this medicine? Take this medicine by mouth with a glass of water. Follow the directions on the prescription label. Do not crush or chew. Take this medicine with or immediately after meals. Take your doses at regular intervals. Do not take more medicine than directed. Do not stop taking this medicine suddenly. This could lead to serious heart-related effects. Talk to your pediatrician regarding the use of this medicine in children. While this drug may be prescribed for children as young as 6 years for selected conditions, precautions do apply. Overdosage: If you think you have taken too much of this medicine contact a poison control center or emergency room at once. NOTE: This medicine is only for you. Do not share this medicine with others. What if I miss a dose? If you miss a dose, take it as soon as you can. If it is almost time for your next dose, take only that dose. Do not take double or extra doses. What may interact with this medicine? This medicine may interact with the following medications:  certain medicines for blood pressure, heart disease, irregular heart beat  certain medicines for depression, like monoamine oxidase (MAO) inhibitors,  fluoxetine, or paroxetine  clonidine  dobutamine  epinephrine  isoproterenol  reserpine This list may not describe all possible interactions. Give your health care provider a list of all the medicines, herbs, non-prescription drugs, or dietary supplements you use. Also tell them if you  smoke, drink alcohol, or use illegal drugs. Some items may interact with your medicine. What should I watch for while using this medicine? Visit your doctor or health care professional for regular check ups. Contact your doctor right away if your symptoms worsen. Check your blood pressure and pulse rate regularly. Ask your health care professional what your blood pressure and pulse rate should be, and when you should contact them. You may get drowsy or dizzy. Do not drive, use machinery, or do anything that needs mental alertness until you know how this medicine affects you. Do not sit or stand up quickly, especially if you are an older patient. This reduces the risk of dizzy or fainting spells. Contact your doctor if these symptoms continue. Alcohol may interfere with the effect of this medicine. Avoid alcoholic drinks. This medicine may increase blood sugar. Ask your healthcare provider if changes in diet or medicines are needed if you have diabetes. What side effects may I notice from receiving this medicine? Side effects that you should report to your doctor or health care professional as soon as possible:  allergic reactions like skin rash, itching or hives  cold or numb hands or feet  depression  difficulty breathing  faint  fever with sore throat  irregular heartbeat, chest pain  rapid weight gain   signs and symptoms of high blood sugar such as being more thirsty or hungry or having to urinate more than normal. You may also feel very tired or have blurry vision.  swollen legs or ankles Side effects that usually do not require medical attention (report to your doctor or health care professional if they continue or are bothersome):  anxiety or nervousness  change in sex drive or performance  dry skin  headache  nightmares or trouble sleeping  short term memory loss  stomach upset or diarrhea This list may not describe all possible side effects. Call your doctor for  medical advice about side effects. You may report side effects to FDA at 1-800-FDA-1088. Where should I keep my medicine? Keep out of the reach of children. Store at room temperature between 15 and 30 degrees C (59 and 86 degrees F). Throw away any unused medicine after the expiration date. NOTE: This sheet is a summary. It may not cover all possible information. If you have questions about this medicine, talk to your doctor, pharmacist, or health care provider.  2020 Elsevier/Gold Standard (2018-10-01 11:09:41)

## 2019-11-24 ENCOUNTER — Telehealth: Payer: Self-pay

## 2019-11-24 DIAGNOSIS — E782 Mixed hyperlipidemia: Secondary | ICD-10-CM

## 2019-11-24 DIAGNOSIS — I4891 Unspecified atrial fibrillation: Secondary | ICD-10-CM

## 2019-11-24 DIAGNOSIS — E559 Vitamin D deficiency, unspecified: Secondary | ICD-10-CM

## 2019-11-24 DIAGNOSIS — I9789 Other postprocedural complications and disorders of the circulatory system, not elsewhere classified: Secondary | ICD-10-CM

## 2019-11-24 DIAGNOSIS — Z1329 Encounter for screening for other suspected endocrine disorder: Secondary | ICD-10-CM

## 2019-11-24 DIAGNOSIS — I10 Essential (primary) hypertension: Secondary | ICD-10-CM

## 2019-11-24 DIAGNOSIS — Z8673 Personal history of transient ischemic attack (TIA), and cerebral infarction without residual deficits: Secondary | ICD-10-CM

## 2019-11-24 NOTE — Telephone Encounter (Signed)
Contacted patient and she is ok with coming in morning for labs. Orders placed

## 2019-11-25 ENCOUNTER — Other Ambulatory Visit: Payer: Self-pay | Admitting: Cardiology

## 2019-11-25 DIAGNOSIS — Z1231 Encounter for screening mammogram for malignant neoplasm of breast: Secondary | ICD-10-CM

## 2019-11-26 DIAGNOSIS — J3489 Other specified disorders of nose and nasal sinuses: Secondary | ICD-10-CM | POA: Diagnosis not present

## 2019-11-26 DIAGNOSIS — Z20828 Contact with and (suspected) exposure to other viral communicable diseases: Secondary | ICD-10-CM | POA: Diagnosis not present

## 2020-01-30 ENCOUNTER — Ambulatory Visit (INDEPENDENT_AMBULATORY_CARE_PROVIDER_SITE_OTHER): Payer: Medicare HMO | Admitting: Cardiology

## 2020-01-30 ENCOUNTER — Encounter: Payer: Self-pay | Admitting: Cardiology

## 2020-01-30 ENCOUNTER — Other Ambulatory Visit: Payer: Self-pay

## 2020-01-30 VITALS — BP 100/78 | HR 71 | Ht 67.0 in | Wt 175.0 lb

## 2020-01-30 DIAGNOSIS — E782 Mixed hyperlipidemia: Secondary | ICD-10-CM

## 2020-01-30 DIAGNOSIS — I7101 Dissection of thoracic aorta: Secondary | ICD-10-CM

## 2020-01-30 DIAGNOSIS — Z8679 Personal history of other diseases of the circulatory system: Secondary | ICD-10-CM | POA: Diagnosis not present

## 2020-01-30 DIAGNOSIS — Z952 Presence of prosthetic heart valve: Secondary | ICD-10-CM

## 2020-01-30 DIAGNOSIS — I639 Cerebral infarction, unspecified: Secondary | ICD-10-CM

## 2020-01-30 DIAGNOSIS — I429 Cardiomyopathy, unspecified: Secondary | ICD-10-CM

## 2020-01-30 DIAGNOSIS — Z9889 Other specified postprocedural states: Secondary | ICD-10-CM | POA: Diagnosis not present

## 2020-01-30 DIAGNOSIS — I71019 Dissection of thoracic aorta, unspecified: Secondary | ICD-10-CM

## 2020-01-30 DIAGNOSIS — Z1329 Encounter for screening for other suspected endocrine disorder: Secondary | ICD-10-CM

## 2020-01-30 NOTE — Patient Instructions (Signed)
Medication Instructions:  No medication changes *If you need a refill on your cardiac medications before your next appointment, please call your pharmacy*  Lab Work: You had a BMET, CBC, TSH, Lipids and LFT today. If you have labs (blood work) drawn today and your tests are completely normal, you will receive your results only by: Marland Kitchen MyChart Message (if you have MyChart) OR . A paper copy in the mail If you have any lab test that is abnormal or we need to change your treatment, we will call you to review the results.  Testing/Procedures: None ordered  Follow-Up: At [facility], you and your health needs are our priority.  As part of our continuing mission to provide you with exceptional heart care, we have created designated Provider Care Teams.  These Care Teams include your primary Cardiologist (physician) and Advanced Practice Providers (APPs -  Physician Assistants and Nurse Practitioners) who all work together to provide you with the care you need, when you need it.  Your next appointment:   1 month(s)  The format for your next appointment:   In Person  Provider:   Jyl Heinz, MD  Other Instructions NA

## 2020-01-30 NOTE — Progress Notes (Signed)
Cardiology Office Note:    Date:  01/30/2020   ID:  Stacy Jenkins, DOB May 15, 1961, MRN [id number]  PCP:  [name], MD  Cardiologist:  [name], MD   Referring MD: [name], MD    ASSESSMENT:    1. S/P ascending aortic aneurysm repair   2. S/P AVR   3. Mixed dyslipidemia   4. History of atrial fibrillation   5. Dissection of thoracic aorta (HCC)   6. Cardiomyopathy, unspecified type ([place])   7. Mixed hyperlipidemia   8. Cerebrovascular accident (CVA), unspecified mechanism ([place])    PLAN:    In order of problems listed above:  1. Cardiomyopathy: We discussed Entresto therapy again.  Patient will have complete blood work today including Chem-7 CBC liver lipid check and TSH.  Based on this we will make recommendations for Entresto.  In this case her beta-blocker will have to be withheld.  We will make these recommendations after we get the blood work results early next week. 2. History of atrial fibrillation: I discussed with the patient atrial fibrillation, disease process. Management and therapy including rate and rhythm control, anticoagulation benefits and potential risks were discussed extensively with the patient. Patient had multiple questions which were answered to patient's satisfaction. 3. Post aortic valve replacement: Stable and asymptomatic at this time. 4. She will be seen in follow-up appointment in a month or earlier if she has any concerns.  Importance of regular exercise stressed cardiomyopathy issues were discussed at length.  Benefits and risks of Entresto visited.  She vocalized understanding and questions were answered to her satisfaction.  I have discussed with her son who is a Librarian, academic all these issues at extensive length.   Medication Adjustments/Labs and Tests Ordered: Current medicines are reviewed at length with the patient today.  Concerns regarding medicines are outlined above.  No orders of the defined types were placed  in this encounter.  No orders of the defined types were placed in this encounter.    Chief Complaint  Patient presents with  . Follow-up     History of Present Illness:    Stacy Jenkins is a 59 y.o. female.  Patient has past medical history of significant multiple comorbidities including repair of the ascending aorta, aortic valve replacement, mixed dyslipidemia, history of atrial fibrillation and cardiomyopathy.  She has had a stroke with significant recovery.  She denies any problems at this time and takes care of activities of daily living.  No chest pain orthopnea or PND.  At the time of my evaluation, the patient is alert awake oriented and in no distress.  She was to be initiated on Entresto therapy but could not follow-up because of challenges in the family.  Her husband accompanies her for this visit and is very supportive.  At the time of my evaluation, the patient is alert awake oriented and in no distress.  Past Medical History:  Diagnosis Date  . Stroke [facility])     Past Surgical History:  Procedure Laterality Date  . aortic valve surgery  02/2016  . TEE WITHOUT CARDIOVERSION N/A 07/16/2017   Procedure: TRANSESOPHAGEAL ECHOCARDIOGRAM (TEE);  Surgeon: Sanda Klein, MD;  Location: [facility];  Service: Cardiovascular;  Laterality: N/A;    Current Medications: Current Meds  Medication Sig  . apixaban (ELIQUIS) 5 MG TABS tablet Take 1 tablet (5 mg total) by mouth 2 (two) times daily.  Marland Kitchen aspirin EC 81 MG tablet Take 81 mg by mouth daily.  . Coenzyme  Q-10 200 MG CAPS Take 1 capsule by mouth daily.  . metoprolol succinate (TOPROL-XL) 50 MG 24 hr tablet Take 1 tablet (50 mg total) by mouth daily. Take with or immediately following a meal.  . Multiple Vitamin (MULTIVITAMIN) capsule Take by mouth.  . pravastatin (PRAVACHOL) 40 MG tablet Take 1 tablet (40 mg total) by mouth daily.     Allergies:   Penicillin g   Social History   Socioeconomic History  . Marital status:  Married    Spouse name: Not on file  . Number of children: Not on file  . Years of education: Not on file  . Highest education level: Not on file  Occupational History  . Not on file  Tobacco Use  . Smoking status: Never Smoker  . Smokeless tobacco: Never Used  Substance and Sexual Activity  . Alcohol use: No  . Drug use: No  . Sexual activity: Not on file  Other Topics Concern  . Not on file  Social History Narrative  . Not on file   Social Determinants of Health   Financial Resource Strain:   . Difficulty of Paying Living Expenses: Not on file  Food Insecurity:   . Worried About Charity fundraiser in the Last Year: Not on file  . Ran Out of Food in the Last Year: Not on file  Transportation Needs:   . Lack of Transportation (Medical): Not on file  . Lack of Transportation (Non-Medical): Not on file  Physical Activity:   . Days of Exercise per Week: Not on file  . Minutes of Exercise per Session: Not on file  Stress:   . Feeling of Stress : Not on file  Social Connections:   . Frequency of Communication with Friends and Family: Not on file  . Frequency of Social Gatherings with Friends and Family: Not on file  . Attends Religious Services: Not on file  . Active Member of Clubs or Organizations: Not on file  . Attends Archivist Meetings: Not on file  . Marital Status: Not on file     Family History: The patient's family history includes Heart attack in her father; Heart disease in her brother and father; Hypertension in her mother.  ROS:   Please see the history of present illness.    All other systems reviewed and are negative.  EKGs/Labs/Other Studies Reviewed:    The following studies were reviewed today: EKG reveals sinus rhythm and nonspecific ST-T changes   Recent Labs: No results found for requested labs within last 8760 hours.  Recent Lipid Panel    Component Value Date/Time   CHOL 142 07/14/2017 0241   TRIG 94 07/14/2017 0241   HDL  53 07/14/2017 0241   CHOLHDL 2.7 07/14/2017 0241   VLDL 19 07/14/2017 0241   LDLCALC 70 07/14/2017 0241    Physical Exam:    VS:  BP 100/78   Pulse 71   Ht 5\' 7"  (1.702 m)   Wt 175 lb (79.4 kg)   SpO2 97%   BMI 27.41 kg/m     Wt Readings from Last 3 Encounters:  01/30/20 175 lb (79.4 kg)  10/30/19 176 lb (79.8 kg)  01/08/19 175 lb (79.4 kg)     GEN: Patient is in no acute distress HEENT: Normal NECK: No JVD; No carotid bruits LYMPHATICS: No lymphadenopathy CARDIAC: Hear sounds regular, 2/6 systolic murmur at the apex. RESPIRATORY:  Clear to auscultation without rales, wheezing or rhonchi  ABDOMEN: Soft, non-tender, non-distended  MUSCULOSKELETAL:  No edema; No deformity  SKIN: Warm and dry NEUROLOGIC:  Alert and oriented x 3 PSYCHIATRIC:  Normal affect   Signed, [name], MD  01/30/2020 9:51 AM    [place]

## 2020-01-31 LAB — CBC WITH DIFFERENTIAL/PLATELET
Basophils Absolute: 0 10*3/uL (ref 0.0–0.2)
Basos: 1 %
EOS (ABSOLUTE): 0.2 10*3/uL (ref 0.0–0.4)
Eos: 4 %
Hematocrit: 41.1 % (ref 34.0–46.6)
Hemoglobin: 13.5 g/dL (ref 11.1–15.9)
Immature Grans (Abs): 0 10*3/uL (ref 0.0–0.1)
Immature Granulocytes: 0 %
Lymphocytes Absolute: 1.5 10*3/uL (ref 0.7–3.1)
Lymphs: 38 %
MCH: 31.2 pg (ref 26.6–33.0)
MCHC: 32.8 g/dL (ref 31.5–35.7)
MCV: 95 fL (ref 79–97)
Monocytes Absolute: 0.4 10*3/uL (ref 0.1–0.9)
Monocytes: 11 %
Neutrophils Absolute: 1.8 10*3/uL (ref 1.4–7.0)
Neutrophils: 46 %
Platelets: 182 10*3/uL (ref 150–450)
RBC: 4.33 x10E6/uL (ref 3.77–5.28)
RDW: 11.1 % — ABNORMAL LOW (ref 11.7–15.4)
WBC: 4 10*3/uL (ref 3.4–10.8)

## 2020-01-31 LAB — BASIC METABOLIC PANEL
BUN/Creatinine Ratio: 25 (ref 12–28)
BUN: 20 mg/dL (ref 8–27)
CO2: 24 mmol/L (ref 20–29)
Calcium: 9.8 mg/dL (ref 8.7–10.3)
Chloride: 103 mmol/L (ref 96–106)
Creatinine, Ser: 0.81 mg/dL (ref 0.57–1.00)
GFR calc Af Amer: 91 mL/min/{1.73_m2} (ref >59)
GFR calc non Af Amer: 79 mL/min/{1.73_m2} (ref >59)
Glucose: 88 mg/dL (ref 65–99)
Potassium: 4.7 mmol/L (ref 3.5–5.2)
Sodium: 141 mmol/L (ref 134–144)

## 2020-01-31 LAB — LIPID PANEL
Chol/HDL Ratio: 3.2 ratio (ref 0.0–4.4)
Cholesterol, Total: 203 mg/dL — ABNORMAL HIGH (ref 100–199)
HDL: 64 mg/dL (ref >39)
LDL Chol Calc (NIH): 109 mg/dL — ABNORMAL HIGH (ref 0–99)
Triglycerides: 172 mg/dL — ABNORMAL HIGH (ref 0–149)
VLDL Cholesterol Cal: 30 mg/dL (ref 5–40)

## 2020-01-31 LAB — TSH: TSH: 1.98 u[IU]/mL (ref 0.450–4.500)

## 2020-01-31 LAB — HEPATIC FUNCTION PANEL
ALT: 7 IU/L (ref 0–32)
AST: 21 IU/L (ref 0–40)
Albumin: 4.7 g/dL (ref 3.8–4.9)
Alkaline Phosphatase: 97 IU/L (ref 39–117)
Bilirubin Total: 0.6 mg/dL (ref 0.0–1.2)
Bilirubin, Direct: 0.15 mg/dL (ref 0.00–0.40)
Total Protein: 7.7 g/dL (ref 6.0–8.5)

## 2020-02-16 ENCOUNTER — Other Ambulatory Visit: Payer: Self-pay

## 2020-02-16 DIAGNOSIS — Z79899 Other long term (current) drug therapy: Secondary | ICD-10-CM

## 2020-02-16 NOTE — Progress Notes (Signed)
Letter sent.

## 2020-02-17 ENCOUNTER — Telehealth: Payer: Self-pay | Admitting: Cardiology

## 2020-02-17 NOTE — Telephone Encounter (Signed)
Patient's husband returning call for lab results. 

## 2020-02-17 NOTE — Telephone Encounter (Signed)
Lm to call back ./cy 

## 2020-02-17 NOTE — Telephone Encounter (Signed)
VM left for pt/husband to callback.

## 2020-02-25 ENCOUNTER — Other Ambulatory Visit: Payer: Self-pay

## 2020-02-25 ENCOUNTER — Telehealth: Payer: Self-pay | Admitting: Cardiology

## 2020-02-25 MED ORDER — ENTRESTO 24-26 MG PO TABS: 1.0000 | ORAL_TABLET | Freq: Two times a day (BID) | ORAL | 12 refills | Status: DC

## 2020-02-25 NOTE — Telephone Encounter (Signed)
Spoke with pt's husband and he will come by and pickup Entresto. He verbalizes understanding of recommendations of Dr Geraldo Pitter and the need for labs.

## 2020-02-25 NOTE — Telephone Encounter (Signed)
Returned call to patient's husband.Stated he is returning a call to [place].Stated they were out of town last week and they received letter she mailed.Stated she can call [phone].Message sent to her.

## 2020-02-25 NOTE — Telephone Encounter (Signed)
Follow Up:    Please call, concerning pt's medicine change  Last week please.

## 2020-03-02 ENCOUNTER — Ambulatory Visit: Payer: Medicare HMO | Admitting: Cardiology

## 2020-03-05 ENCOUNTER — Other Ambulatory Visit: Payer: Self-pay

## 2020-03-05 ENCOUNTER — Other Ambulatory Visit: Payer: Self-pay | Admitting: Cardiology

## 2020-03-05 ENCOUNTER — Ambulatory Visit: Payer: Medicare HMO | Admitting: Cardiology

## 2020-03-05 ENCOUNTER — Encounter: Payer: Self-pay | Admitting: Cardiology

## 2020-03-05 VITALS — BP 100/72 | HR 50 | Ht 67.0 in | Wt 176.0 lb

## 2020-03-05 DIAGNOSIS — E782 Mixed hyperlipidemia: Secondary | ICD-10-CM | POA: Diagnosis not present

## 2020-03-05 DIAGNOSIS — I10 Essential (primary) hypertension: Secondary | ICD-10-CM | POA: Diagnosis not present

## 2020-03-05 DIAGNOSIS — Z9889 Other specified postprocedural states: Secondary | ICD-10-CM | POA: Diagnosis not present

## 2020-03-05 DIAGNOSIS — Z8679 Personal history of other diseases of the circulatory system: Secondary | ICD-10-CM

## 2020-03-05 DIAGNOSIS — Z1231 Encounter for screening mammogram for malignant neoplasm of breast: Secondary | ICD-10-CM

## 2020-03-05 DIAGNOSIS — Z8673 Personal history of transient ischemic attack (TIA), and cerebral infarction without residual deficits: Secondary | ICD-10-CM | POA: Diagnosis not present

## 2020-03-05 DIAGNOSIS — I429 Cardiomyopathy, unspecified: Secondary | ICD-10-CM

## 2020-03-05 DIAGNOSIS — Z952 Presence of prosthetic heart valve: Secondary | ICD-10-CM

## 2020-03-05 LAB — HEPATIC FUNCTION PANEL
ALT: 11 IU/L (ref 0–32)
AST: 21 IU/L (ref 0–40)
Albumin: 4.5 g/dL (ref 3.8–4.9)
Alkaline Phosphatase: 104 IU/L (ref 39–117)
Bilirubin Total: 0.7 mg/dL (ref 0.0–1.2)
Bilirubin, Direct: 0.17 mg/dL (ref 0.00–0.40)
Total Protein: 7 g/dL (ref 6.0–8.5)

## 2020-03-05 LAB — BASIC METABOLIC PANEL
BUN/Creatinine Ratio: 27 (ref 12–28)
BUN: 21 mg/dL (ref 8–27)
CO2: 22 mmol/L (ref 20–29)
Calcium: 9.1 mg/dL (ref 8.7–10.3)
Chloride: 101 mmol/L (ref 96–106)
Creatinine, Ser: 0.78 mg/dL (ref 0.57–1.00)
GFR calc Af Amer: 96 mL/min/{1.73_m2} (ref >59)
GFR calc non Af Amer: 83 mL/min/{1.73_m2} (ref >59)
Glucose: 97 mg/dL (ref 65–99)
Potassium: 4.7 mmol/L (ref 3.5–5.2)
Sodium: 137 mmol/L (ref 134–144)

## 2020-03-05 NOTE — Progress Notes (Signed)
Cardiology Office Note:    Date:  03/05/2020   ID:  Stacy Jenkins, DOB [birth year], MRN [id number]  PCP:  [name], MD  Cardiologist:  [name], MD   Referring MD: [name], MD    ASSESSMENT:    1. Benign essential hypertension   2. Cardiomyopathy, unspecified type ([place])   3. History of CVA (cerebrovascular accident)   4. S/P AVR   5. S/P ascending aortic aneurysm repair   6. Mixed dyslipidemia   7. History of atrial fibrillation    PLAN:    In order of problems listed above:  1. Cardiomyopathy: I discussed my findings with the patient at length.  She is tolerating Entresto well.  She will have a Chem-7 today.  I reviewed last echocardiogram findings with her this was done at [facility].  I would like for her to be seen in 2 months in follow-up appointment.  At that time she will get a echocardiogram to assess this.  As she was initiated on Entresto she will get a Chem-7 today. 2. Coronary artery disease: Stable at this time. 3. Mixed dyslipidemia: Diet was emphasized.  She will have liver panel today.  I plan to change her medications as I want to optimize her LDL.  Diet was emphasized.  She is lax with her diet and she is going to try to do better. 4. Importance of compliance with exercise stressed and she promises to do it on a very regular basis. 5. Patient will be seen in follow-up appointment in 2 months or earlier if the patient has any concerns    Medication Adjustments/Labs and Tests Ordered: Current medicines are reviewed at length with the patient today.  Concerns regarding medicines are outlined above.  No orders of the defined types were placed in this encounter.  No orders of the defined types were placed in this encounter.    Chief Complaint  Patient presents with  . Follow-up    1 Month     History of Present Illness:    Stacy Jenkins is a 59 y.o. female.  Patient has past medical history of aortic valve replacement and aortic  repair.  She has had history of aneurysm and dissection of the aorta.  She has paroxysmal atrial fibrillation in the past.  Recent ejection fraction was moderately depressed at [facility] and patient has now been initiated on Entresto.  She seems to be tolerating it well.  Her blood pressure is borderline.  No chest pain orthopnea PND or dizziness.  At the time of my evaluation, the patient is alert awake oriented and in no distress.  Past Medical History:  Diagnosis Date  . Stroke [facility])     Past Surgical History:  Procedure Laterality Date  . aortic valve surgery  02/2016  . TEE WITHOUT CARDIOVERSION N/A 07/16/2017   Procedure: TRANSESOPHAGEAL ECHOCARDIOGRAM (TEE);  Surgeon: Sanda Klein, MD;  Location: [facility];  Service: Cardiovascular;  Laterality: N/A;    Current Medications: Current Meds  Medication Sig  . apixaban (ELIQUIS) 5 MG TABS tablet Take 1 tablet (5 mg total) by mouth 2 (two) times daily.  Marland Kitchen aspirin EC 81 MG tablet Take 81 mg by mouth daily.  . Coenzyme Q-10 200 MG CAPS Take 1 capsule by mouth daily.  . Multiple Vitamin (MULTIVITAMIN) capsule Take by mouth.  . sacubitril-valsartan (ENTRESTO) 24-26 MG Take 1 tablet by mouth 2 (two) times daily.     Allergies:   Penicillin g   Social History  Socioeconomic History  . Marital status: Married    Spouse name: Not on file  . Number of children: Not on file  . Years of education: Not on file  . Highest education level: Not on file  Occupational History  . Not on file  Tobacco Use  . Smoking status: Never Smoker  . Smokeless tobacco: Never Used  Substance and Sexual Activity  . Alcohol use: No  . Drug use: No  . Sexual activity: Not on file  Other Topics Concern  . Not on file  Social History Narrative  . Not on file   Social Determinants of Health   Financial Resource Strain:   . Difficulty of Paying Living Expenses:   Food Insecurity:   . Worried About Charity fundraiser in the Last Year:   . Youth worker in the Last Year:   Transportation Needs:   . Film/video editor (Medical):   Marland Kitchen Lack of Transportation (Non-Medical):   Physical Activity:   . Days of Exercise per Week:   . Minutes of Exercise per Session:   Stress:   . Feeling of Stress :   Social Connections:   . Frequency of Communication with Friends and Family:   . Frequency of Social Gatherings with Friends and Family:   . Attends Religious Services:   . Active Member of Clubs or Organizations:   . Attends Archivist Meetings:   Marland Kitchen Marital Status:      Family History: The patient's family history includes Heart attack in her father; Heart disease in her brother and father; Hypertension in her mother.  ROS:   Please see the history of present illness.    All other systems reviewed and are negative.  EKGs/Labs/Other Studies Reviewed:    The following studies were reviewed today: As mentioned above   Recent Labs: 01/30/2020: ALT 7; BUN 20; Creatinine, Ser 0.81; Hemoglobin 13.5; Platelets 182; Potassium 4.7; Sodium 141; TSH 1.980  Recent Lipid Panel    Component Value Date/Time   CHOL 203 (H) 01/30/2020 1020   TRIG 172 (H) 01/30/2020 1020   HDL 64 01/30/2020 1020   CHOLHDL 3.2 01/30/2020 1020   CHOLHDL 2.7 07/14/2017 0241   VLDL 19 07/14/2017 0241   LDLCALC 109 (H) 01/30/2020 1020    Physical Exam:    VS:  BP 100/72   Pulse (!) 50   Ht 5\' 7"  (1.702 m)   Wt 176 lb (79.8 kg)   SpO2 95%   BMI 27.57 kg/m     Wt Readings from Last 3 Encounters:  03/05/20 176 lb (79.8 kg)  01/30/20 175 lb (79.4 kg)  10/30/19 176 lb (79.8 kg)     GEN: Patient is in no acute distress HEENT: Normal NECK: No JVD; No carotid bruits LYMPHATICS: No lymphadenopathy CARDIAC: Hear sounds regular, 2/6 systolic murmur at the apex. RESPIRATORY:  Clear to auscultation without rales, wheezing or rhonchi  ABDOMEN: Soft, non-tender, non-distended MUSCULOSKELETAL:  No edema; No deformity  SKIN: Warm and dry  NEUROLOGIC:  Alert and oriented x 3 PSYCHIATRIC:  Normal affect   Signed, [name], MD  03/05/2020 10:58 AM    [place]

## 2020-03-05 NOTE — Patient Instructions (Signed)
Medication Instructions:  No medication changes *If you need a refill on your cardiac medications before your next appointment, please call your pharmacy*   Lab Work: You had labs If you have labs (blood work) drawn today and your tests are completely normal, you will receive your results only by: Marland Kitchen MyChart Message (if you have MyChart) OR . A paper copy in the mail If you have any lab test that is abnormal or we need to change your treatment, we will call you to review the results.   Testing/Procedures: Your physician has requested that you have an echocardiogram. Echocardiography is a painless test that uses sound waves to create images of your heart. It provides your doctor with information about the size and shape of your heart and how well your heart's chambers and valves are working. This procedure takes approximately one hour. There are no restrictions for this procedure.     Follow-Up: At [facility], you and your health needs are our priority.  As part of our continuing mission to provide you with exceptional heart care, we have created designated Provider Care Teams.  These Care Teams include your primary Cardiologist (physician) and Advanced Practice Providers (APPs -  Physician Assistants and Nurse Practitioners) who all work together to provide you with the care you need, when you need it.  We recommend signing up for the patient portal called "MyChart".  Sign up information is provided on this After Visit Summary.  MyChart is used to connect with patients for Virtual Visits (Telemedicine).  Patients are able to view lab/test results, encounter notes, upcoming appointments, etc.  Non-urgent messages can be sent to your provider as well.   To learn more about what you can do with MyChart, go to [email or web address].    Your next appointment:   2 month(s)  The format for your next appointment:   In Person  Provider:   Jyl Heinz, MD   Other  Instructions  Echocardiogram An echocardiogram is a procedure that uses painless sound waves (ultrasound) to produce an image of the heart. Images from an echocardiogram can provide important information about:  Signs of coronary artery disease (CAD).  Aneurysm detection. An aneurysm is a weak or damaged part of an artery wall that bulges out from the normal force of blood pumping through the body.  Heart size and shape. Changes in the size or shape of the heart can be associated with certain conditions, including heart failure, aneurysm, and CAD.  Heart muscle function.  Heart valve function.  Signs of a past heart attack.  Fluid buildup around the heart.  Thickening of the heart muscle.  A tumor or infectious growth around the heart valves. Tell a health care provider about:  Any allergies you have.  All medicines you are taking, including vitamins, herbs, eye drops, creams, and over-the-counter medicines.  Any blood disorders you have.  Any surgeries you have had.  Any medical conditions you have.  Whether you are pregnant or may be pregnant. What are the risks? Generally, this is a safe procedure. However, problems may occur, including:  Allergic reaction to dye (contrast) that may be used during the procedure. What happens before the procedure? No specific preparation is needed. You may eat and drink normally. What happens during the procedure?   An IV tube may be inserted into one of your veins.  You may receive contrast through this tube. A contrast is an injection that improves the quality of the pictures from your heart.  A gel will be applied to your chest.  A wand-like tool (transducer) will be moved over your chest. The gel will help to transmit the sound waves from the transducer.  The sound waves will harmlessly bounce off of your heart to allow the heart images to be captured in real-time motion. The images will be recorded on a computer. The  procedure may vary among health care providers and hospitals. What happens after the procedure?  You may return to your normal, everyday life, including diet, activities, and medicines, unless your health care provider tells you not to do that. Summary  An echocardiogram is a procedure that uses painless sound waves (ultrasound) to produce an image of the heart.  Images from an echocardiogram can provide important information about the size and shape of your heart, heart muscle function, heart valve function, and fluid buildup around your heart.  You do not need to do anything to prepare before this procedure. You may eat and drink normally.  After the echocardiogram is completed, you may return to your normal, everyday life, unless your health care provider tells you not to do that. This information is not intended to replace advice given to you by your health care provider. Make sure you discuss any questions you have with your health care provider. Document Revised: 04/03/2019 Document Reviewed: 01/13/2017 Elsevier Patient Education  [place].

## 2020-04-03 DIAGNOSIS — I429 Cardiomyopathy, unspecified: Secondary | ICD-10-CM | POA: Diagnosis not present

## 2020-04-03 DIAGNOSIS — I10 Essential (primary) hypertension: Secondary | ICD-10-CM | POA: Diagnosis not present

## 2020-04-03 DIAGNOSIS — Z8673 Personal history of transient ischemic attack (TIA), and cerebral infarction without residual deficits: Secondary | ICD-10-CM | POA: Diagnosis not present

## 2020-04-03 DIAGNOSIS — Z8679 Personal history of other diseases of the circulatory system: Secondary | ICD-10-CM | POA: Diagnosis not present

## 2020-04-03 DIAGNOSIS — E782 Mixed hyperlipidemia: Secondary | ICD-10-CM | POA: Diagnosis not present

## 2020-04-03 DIAGNOSIS — Z1211 Encounter for screening for malignant neoplasm of colon: Secondary | ICD-10-CM | POA: Diagnosis not present

## 2020-04-12 ENCOUNTER — Other Ambulatory Visit: Payer: Medicare HMO

## 2020-04-28 ENCOUNTER — Ambulatory Visit: Payer: Medicare HMO

## 2020-04-28 ENCOUNTER — Other Ambulatory Visit: Payer: Self-pay

## 2020-04-28 DIAGNOSIS — I340 Nonrheumatic mitral (valve) insufficiency: Secondary | ICD-10-CM | POA: Diagnosis not present

## 2020-04-28 DIAGNOSIS — I429 Cardiomyopathy, unspecified: Secondary | ICD-10-CM

## 2020-04-28 NOTE — Progress Notes (Signed)
Complete echocardiogram performed.  Jimmy Patt Steinhardt RDCS, RVT  

## 2020-04-30 ENCOUNTER — Telehealth: Payer: Self-pay | Admitting: Cardiology

## 2020-04-30 NOTE — Telephone Encounter (Signed)
Left message to call back  

## 2020-04-30 NOTE — Telephone Encounter (Signed)
Patient's husband returning call in regards to echo results.

## 2020-05-04 NOTE — Telephone Encounter (Signed)
Patient's husband returning call. 

## 2020-05-04 NOTE — Telephone Encounter (Signed)
Results reviewed with pt as per Dr. Revankar's note.  Pt verbalized understanding and had no additional questions.   

## 2020-05-06 ENCOUNTER — Ambulatory Visit: Payer: Medicare HMO | Admitting: Cardiology

## 2020-05-17 ENCOUNTER — Other Ambulatory Visit: Payer: Self-pay

## 2020-05-17 ENCOUNTER — Ambulatory Visit: Payer: Medicare HMO | Admitting: Cardiology

## 2020-05-17 ENCOUNTER — Encounter: Payer: Self-pay | Admitting: Cardiology

## 2020-05-17 VITALS — BP 102/64 | HR 80 | Ht 67.0 in | Wt 172.0 lb

## 2020-05-17 DIAGNOSIS — Z9889 Other specified postprocedural states: Secondary | ICD-10-CM | POA: Diagnosis not present

## 2020-05-17 DIAGNOSIS — Z8679 Personal history of other diseases of the circulatory system: Secondary | ICD-10-CM | POA: Diagnosis not present

## 2020-05-17 DIAGNOSIS — E782 Mixed hyperlipidemia: Secondary | ICD-10-CM

## 2020-05-17 DIAGNOSIS — I429 Cardiomyopathy, unspecified: Secondary | ICD-10-CM

## 2020-05-17 DIAGNOSIS — I10 Essential (primary) hypertension: Secondary | ICD-10-CM

## 2020-05-17 DIAGNOSIS — Z952 Presence of prosthetic heart valve: Secondary | ICD-10-CM

## 2020-05-17 LAB — BASIC METABOLIC PANEL
BUN/Creatinine Ratio: 24 (ref 12–28)
BUN: 21 mg/dL (ref 8–27)
CO2: 21 mmol/L (ref 20–29)
Calcium: 9.7 mg/dL (ref 8.7–10.3)
Chloride: 103 mmol/L (ref 96–106)
Creatinine, Ser: 0.86 mg/dL (ref 0.57–1.00)
GFR calc Af Amer: 85 mL/min/{1.73_m2} (ref >59)
GFR calc non Af Amer: 74 mL/min/{1.73_m2} (ref >59)
Glucose: 81 mg/dL (ref 65–99)
Potassium: 4.2 mmol/L (ref 3.5–5.2)
Sodium: 140 mmol/L (ref 134–144)

## 2020-05-17 NOTE — Patient Instructions (Signed)
Medication Instructions:  No medication changes. *If you need a refill on your cardiac medications before your next appointment, please call your pharmacy*   Lab Work: Your physician recommends that you have a BMET done today in the office.  If you have labs (blood work) drawn today and your tests are completely normal, you will receive your results only by: Marland Kitchen MyChart Message (if you have MyChart) OR . A paper copy in the mail If you have any lab test that is abnormal or we need to change your treatment, we will call you to review the results.   Testing/Procedures: Your physician has requested that you have an echocardiogram. Echocardiography is a painless test that uses sound waves to create images of your heart. It provides your doctor with information about the size and shape of your heart and how well your heart's chambers and valves are working. This procedure takes approximately one hour. There are no restrictions for this procedure.     Follow-Up: At [facility], you and your health needs are our priority.  As part of our continuing mission to provide you with exceptional heart care, we have created designated Provider Care Teams.  These Care Teams include your primary Cardiologist (physician) and Advanced Practice Providers (APPs -  Physician Assistants and Nurse Practitioners) who all work together to provide you with the care you need, when you need it.  We recommend signing up for the patient portal called "MyChart".  Sign up information is provided on this After Visit Summary.  MyChart is used to connect with patients for Virtual Visits (Telemedicine).  Patients are able to view lab/test results, encounter notes, upcoming appointments, etc.  Non-urgent messages can be sent to your provider as well.   To learn more about what you can do with MyChart, go to [email or web address].    Your next appointment:   4 month(s)  The format for your next appointment:   In  Person  Provider:   Jyl Heinz, MD   Other Instructions NA

## 2020-05-17 NOTE — Progress Notes (Signed)
Cardiology Office Note:    Date:  05/17/2020   ID:  Stacy Jenkins, DOB 02-13-1961, MRN [id number]  PCP:  [name], MD  Cardiologist:  [name], MD   Referring MD: [name], MD    ASSESSMENT:    1. Benign essential hypertension   2. Cardiomyopathy, unspecified type ([place])   3. Mixed hyperlipidemia   4. S/P AVR   5. S/P ascending aortic aneurysm repair   6. Mixed dyslipidemia    PLAN:    In order of problems listed above:  1. Primary prevention stressed with patient.  Importance of compliance with diet medication stressed and she vocalized understanding. 2. Essential hypertension: Blood pressure is stable 3. Status post aortic valve replacement: Stable at this time and asymptomatic.  Echocardiogram report discussed with the patient 4. Post and ascending aorta repair: Stable at this time followed by surgeon at [facility] 5. Cardiomyopathy: On Entresto: Diet was mentioned importance of regular exercise stressed she will have a Chem-7 today.  She will be seen in follow-up appointment in 4 months or earlier if she has any concerns an echocardiogram will be done before this.  Patient and husband had multiple questions which were answered to their satisfaction.   Medication Adjustments/Labs and Tests Ordered: Current medicines are reviewed at length with the patient today.  Concerns regarding medicines are outlined above.  No orders of the defined types were placed in this encounter.  No orders of the defined types were placed in this encounter.    No chief complaint on file.    History of Present Illness:    Stacy Jenkins is a 59 y.o. female.  Patient has past medical history of aortic valve replacement and ascending aortic repair.  Patient has cardiomyopathy with mildly depressed ejection fraction.  She denies any problems at this time and takes care of activities of daily living.  She walks on a regular basis.  She mentions to me that complete blood work  was done by her primary care physician and she was told that it was fine.  At the time of my evaluation, the patient is alert awake oriented and in no distress.  Past Medical History:  Diagnosis Date  . Abnormal echocardiogram 08/20/2018   Added automatically from request for surgery [id number]  . Acute blood loss anemia 11/19/2018  . Acute hyperglycemia 03/10/2016  . Acute post-operative pain 11/19/2018  . Aphasia   . Benign essential hypertension 08/09/2017   at goal on current meds  Formatting of this note might be different from the original. at goal on current meds  . Cardiomyopathy ([place]) 01/29/2018  . 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 4.6 cm  . 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 Elevated blood-pressure reading without diagnosis of hypertension 01/29/2018   will monitor  Formatting of this note might be different from the original. will monitor  . Expressive aphasia 11/20/2018  . Hiatal hernia 01/29/2018  . History of atrial fibrillation 11/20/2018  . History of CVA (cerebrovascular accident) 08/09/2017  . History of migraine 01/29/2018  . Hyperlipidemia 01/08/2019  . Hypertension 01/08/2019  . Hypoglycemia 01/29/2018   dietary info  Formatting of this note might be different from the original. dietary info  . Medicare annual wellness visit, subsequent 02/17/2019  . Mixed dyslipidemia 08/09/2017  . Nonrheumatic aortic valve insufficiency   . Other abnormal glucose 01/29/2018  . PFO (patent foramen ovale) 09/11/2012  .  Postoperative atrial fibrillation ([place]) 11/20/2018  . S/P ascending aortic aneurysm repair 11/20/2018  . S/P AVR 11/19/2018  . Stroke (cerebrum) ([place]) - Left MCA branchStroke in a patient with history of prior strokes and mild residual deficits, s/p tPA 07/13/2017  . Stroke ([place])   . Vitamin D deficiency 01/29/2018    Past Surgical History:  Procedure Laterality Date  . aortic valve surgery  02/2016  .  TEE WITHOUT CARDIOVERSION N/A 07/16/2017   Procedure: TRANSESOPHAGEAL ECHOCARDIOGRAM (TEE);  Surgeon: Sanda Klein, MD;  Location: [facility];  Service: Cardiovascular;  Laterality: N/A;    Current Medications: Current Meds  Medication Sig  . apixaban (ELIQUIS) 5 MG TABS tablet Take 1 tablet (5 mg total) by mouth 2 (two) times daily.  Marland Kitchen aspirin EC 81 MG tablet Take 81 mg by mouth daily.  . Coenzyme Q-10 200 MG CAPS Take 1 capsule by mouth daily.  . Multiple Vitamin (MULTIVITAMIN) capsule Take by mouth.  . sacubitril-valsartan (ENTRESTO) 24-26 MG Take 1 tablet by mouth 2 (two) times daily.     Allergies:   Penicillin g   Social History   Socioeconomic History  . Marital status: Married    Spouse name: Not on file  . Number of children: Not on file  . Years of education: Not on file  . Highest education level: Not on file  Occupational History  . Not on file  Tobacco Use  . Smoking status: Never Smoker  . Smokeless tobacco: Never Used  Substance and Sexual Activity  . Alcohol use: No  . Drug use: No  . Sexual activity: Not on file  Other Topics Concern  . Not on file  Social History Narrative  . Not on file   Social Determinants of Health   Financial Resource Strain:   . Difficulty of Paying Living Expenses:   Food Insecurity:   . Worried About Charity fundraiser in the Last Year:   . Arboriculturist in the Last Year:   Transportation Needs:   . Film/video editor (Medical):   Marland Kitchen Lack of Transportation (Non-Medical):   Physical Activity:   . Days of Exercise per Week:   . Minutes of Exercise per Session:   Stress:   . Feeling of Stress :   Social Connections:   . Frequency of Communication with Friends and Family:   . Frequency of Social Gatherings with Friends and Family:   . Attends Religious Services:   . Active Member of Clubs or Organizations:   . Attends Archivist Meetings:   Marland Kitchen Marital Status:      Family History: The patient's  family history includes Heart attack in her father; Heart disease in her brother and father; Hypertension in her mother.  ROS:   Please see the history of present illness.    All other systems reviewed and are negative.  EKGs/Labs/Other Studies Reviewed:    The following studies were reviewed today: IMPRESSIONS    1. Left ventricular ejection fraction, by estimation, is 35 to 40%. The  left ventricle has moderately decreased function. The left ventricle has  no regional wall motion abnormalities. The left ventricular internal  cavity size was moderately dilated.  Left ventricular diastolic parameters are consistent with Grade I  diastolic dysfunction (impaired relaxation).  2. Right ventricular systolic function is normal. The right ventricular  size is normal. There is normal pulmonary artery systolic pressure.  3. The mitral valve is normal in structure. Mild to moderate mitral  valve  regurgitation. No evidence of mitral stenosis.  4. Increased gradient across bioprostetic AV noted. . The aortic valve is  normal in structure. Aortic valve regurgitation is not visualized.  Moderate aortic valve stenosis. There is a 21 mm Sorin bovine valve  present in the aortic position.  5. The inferior vena cava is normal in size with greater than 50%  respiratory variability, suggesting right atrial pressure of 3 mmHg.    Recent Labs: 01/30/2020: Hemoglobin 13.5; Platelets 182; TSH 1.980 03/05/2020: ALT 11; BUN 21; Creatinine, Ser 0.78; Potassium 4.7; Sodium 137  Recent Lipid Panel    Component Value Date/Time   CHOL 203 (H) 01/30/2020 1020   TRIG 172 (H) 01/30/2020 1020   HDL 64 01/30/2020 1020   CHOLHDL 3.2 01/30/2020 1020   CHOLHDL 2.7 07/14/2017 0241   VLDL 19 07/14/2017 0241   LDLCALC 109 (H) 01/30/2020 1020    Physical Exam:    VS:  BP 102/64   Pulse 80   Ht 5\' 7"  (1.702 m)   Wt 172 lb (78 kg)   SpO2 96%   BMI 26.94 kg/m     Wt Readings from Last 3 Encounters:    05/17/20 172 lb (78 kg)  03/05/20 176 lb (79.8 kg)  01/30/20 175 lb (79.4 kg)     GEN: Patient is in no acute distress HEENT: Normal NECK: No JVD; No carotid bruits LYMPHATICS: No lymphadenopathy CARDIAC: Hear sounds regular, 2/6 systolic murmur at the apex. RESPIRATORY:  Clear to auscultation without rales, wheezing or rhonchi  ABDOMEN: Soft, non-tender, non-distended MUSCULOSKELETAL:  No edema; No deformity  SKIN: Warm and dry NEUROLOGIC:  Alert and oriented x 3 PSYCHIATRIC:  Normal affect   Signed, [name], MD  05/17/2020 10:10 AM    [place]

## 2020-06-21 DIAGNOSIS — Z0100 Encounter for examination of eyes and vision without abnormal findings: Secondary | ICD-10-CM | POA: Diagnosis not present

## 2020-08-27 DIAGNOSIS — Z7901 Long term (current) use of anticoagulants: Secondary | ICD-10-CM | POA: Diagnosis not present

## 2020-08-27 DIAGNOSIS — E785 Hyperlipidemia, unspecified: Secondary | ICD-10-CM | POA: Diagnosis not present

## 2020-08-27 DIAGNOSIS — I69328 Other speech and language deficits following cerebral infarction: Secondary | ICD-10-CM | POA: Diagnosis not present

## 2020-08-27 DIAGNOSIS — Z809 Family history of malignant neoplasm, unspecified: Secondary | ICD-10-CM | POA: Diagnosis not present

## 2020-08-27 DIAGNOSIS — Z7982 Long term (current) use of aspirin: Secondary | ICD-10-CM | POA: Diagnosis not present

## 2020-08-27 DIAGNOSIS — I10 Essential (primary) hypertension: Secondary | ICD-10-CM | POA: Diagnosis not present

## 2020-08-27 DIAGNOSIS — Z8249 Family history of ischemic heart disease and other diseases of the circulatory system: Secondary | ICD-10-CM | POA: Diagnosis not present

## 2020-08-27 DIAGNOSIS — Z8673 Personal history of transient ischemic attack (TIA), and cerebral infarction without residual deficits: Secondary | ICD-10-CM | POA: Diagnosis not present

## 2020-09-06 ENCOUNTER — Other Ambulatory Visit: Payer: Medicare HMO

## 2020-09-09 ENCOUNTER — Ambulatory Visit: Payer: Medicare HMO | Admitting: Cardiology

## 2020-09-27 ENCOUNTER — Other Ambulatory Visit: Payer: Medicare HMO

## 2020-09-30 ENCOUNTER — Ambulatory Visit: Payer: Medicare HMO | Admitting: Cardiology

## 2020-10-14 ENCOUNTER — Other Ambulatory Visit: Payer: Self-pay

## 2020-10-14 ENCOUNTER — Ambulatory Visit (INDEPENDENT_AMBULATORY_CARE_PROVIDER_SITE_OTHER): Payer: Medicare HMO

## 2020-10-14 DIAGNOSIS — I340 Nonrheumatic mitral (valve) insufficiency: Secondary | ICD-10-CM | POA: Diagnosis not present

## 2020-10-14 DIAGNOSIS — I429 Cardiomyopathy, unspecified: Secondary | ICD-10-CM | POA: Diagnosis not present

## 2020-10-14 LAB — ECHOCARDIOGRAM COMPLETE
AR max vel: 0.8 cm2
AV Area VTI: 0.77 cm2
AV Area mean vel: 0.75 cm2
AV Mean grad: 18 mmHg
AV Peak grad: 31.5 mmHg
Ao pk vel: 2.81 m/s
Area-P 1/2: 4.57 cm2
Calc EF: 37.4 %
S' Lateral: 4.9 cm
Single Plane A2C EF: 35.9 %
Single Plane A4C EF: 40.6 %

## 2020-10-14 NOTE — Progress Notes (Signed)
Complete echocardiogram performed.  Jimmy Ayde Record RDCS, RVT  

## 2020-10-25 ENCOUNTER — Ambulatory Visit: Payer: Medicare HMO | Admitting: Cardiology

## 2020-10-25 ENCOUNTER — Encounter: Payer: Self-pay | Admitting: Cardiology

## 2020-10-25 ENCOUNTER — Other Ambulatory Visit: Payer: Self-pay

## 2020-10-25 VITALS — BP 108/62 | HR 74 | Ht 67.0 in | Wt 176.0 lb

## 2020-10-25 DIAGNOSIS — I10 Essential (primary) hypertension: Secondary | ICD-10-CM | POA: Diagnosis not present

## 2020-10-25 DIAGNOSIS — Z8679 Personal history of other diseases of the circulatory system: Secondary | ICD-10-CM | POA: Diagnosis not present

## 2020-10-25 DIAGNOSIS — Z952 Presence of prosthetic heart valve: Secondary | ICD-10-CM

## 2020-10-25 DIAGNOSIS — I429 Cardiomyopathy, unspecified: Secondary | ICD-10-CM | POA: Diagnosis not present

## 2020-10-25 DIAGNOSIS — Z9889 Other specified postprocedural states: Secondary | ICD-10-CM | POA: Diagnosis not present

## 2020-10-25 DIAGNOSIS — E782 Mixed hyperlipidemia: Secondary | ICD-10-CM

## 2020-10-25 MED ORDER — DAPAGLIFLOZIN PROPANEDIOL 5 MG PO TABS: 5.0000 mg | ORAL_TABLET | Freq: Every day | ORAL | 3 refills | Status: DC

## 2020-10-25 NOTE — Progress Notes (Signed)
Cardiology Office Note:    Date:  10/25/2020   ID:  Trinh Sanjose, DOB [birth year], MRN [id number]  PCP:  [name], MD  Cardiologist:  [name], MD   Referring MD: [name], MD    ASSESSMENT:    1. Benign essential hypertension   2. Cardiomyopathy, unspecified type ([place])   3. Mixed hyperlipidemia   4. S/P AVR   5. S/P ascending aortic aneurysm repair   6. Mixed dyslipidemia    PLAN:    In order of problems listed above:  1. Primary prevention stressed with the patient.  Importance of compliance with diet medication stressed and she vocalized understanding 2. Essential hypertension: Blood pressure stable and diet was emphasized.  She is on Entresto and she will have a Chem-7 today. 3. Cardiomyopathy: Ejection fraction stable but has not shown any significant improvement.  We will continue current medications.  I discussed with her and her husband Wilder Glade and she is keen on it.  We will initiate this medication on her today and she will keep a track of her blood pressures and will get her back in 2 to 3 weeks for blood pressure check. 4. Mixed dyslipidemia: On statin therapy and lipids followed by primary care physician. 5. 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 Post aortic valve replacement: Stable at this time. 7. Patient will be seen in follow-up appointment in 3 months or earlier if the patient has any concerns    Medication Adjustments/Labs and Tests Ordered: Current medicines are reviewed at length with the patient today.  Concerns regarding medicines are outlined above.  No orders of the defined types were placed in this encounter.  No orders of the defined types were placed in this encounter.    No chief complaint on file.    History of Present Illness:    Stacy Jenkins is a 59 y.o. female..  Patient has past medical history of aortic aneurysm dissection post repair, aortic valve replacement essential hypertension  and dyslipidemia.  She has had history of stroke.  She denies any problems at this time and takes care of activities of daily living.  Which she initiated on Entresto for cardiomyopathy and she has tolerated it well however her blood pressure is borderline and her ejection fraction has not significantly improved since.  She denies any chest pain orthopnea or PND.  At the time of my evaluation, the patient is alert awake oriented and in no distress.  Past Medical History:  Diagnosis Date  . Abnormal echocardiogram 08/20/2018   Added automatically from request for surgery [id number]  . Acute blood loss anemia 11/19/2018  . Acute hyperglycemia 03/10/2016  . Acute post-operative pain 11/19/2018  . Aortic valve regurgitation 08/20/2018   Formatting of this note might be different from the original. Added automatically from request for surgery [id number]  . Aphasia   . Ascending aortic aneurysm ([place])   . Benign essential hypertension 08/09/2017   at goal on current meds  Formatting of this note might be different from the original. at goal on current meds  . Cardiomyopathy ([place]) 01/29/2018  . 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 4.6 cm  . 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 Elevated blood-pressure reading without diagnosis of hypertension 01/29/2018   will monitor  Formatting of this note might be different from the original. will monitor  . Expressive aphasia 11/20/2018  . Hiatal hernia 01/29/2018  .  History of atrial fibrillation 11/20/2018  . History of CVA (cerebrovascular accident) 08/09/2017  . History of migraine 01/29/2018  . Hyperlipidemia 01/08/2019  . Hypertension 01/08/2019  . Hypoglycemia 01/29/2018   dietary info  Formatting of this note might be different from the original. dietary info  . Medicare annual wellness visit, subsequent 02/17/2019  . Mixed dyslipidemia 08/09/2017  . Nonrheumatic aortic valve insufficiency   .  Other abnormal glucose 01/29/2018  . PFO (patent foramen ovale) 09/11/2012  . Postoperative atrial fibrillation ([place]) 11/20/2018  . S/P ascending aortic aneurysm repair 11/20/2018  . S/P AVR 11/19/2018  . Stroke (cerebrum) ([place]) - Left MCA branchStroke in a patient with history of prior strokes and mild residual deficits, s/p tPA 07/13/2017  . Stroke ([place])   . Vitamin D deficiency 01/29/2018    Past Surgical History:  Procedure Laterality Date  . aortic valve surgery  02/2016  . TEE WITHOUT CARDIOVERSION N/A 07/16/2017   Procedure: TRANSESOPHAGEAL ECHOCARDIOGRAM (TEE);  Surgeon: Sanda Klein, MD;  Location: [facility];  Service: Cardiovascular;  Laterality: N/A;    Current Medications: Current Meds  Medication Sig  . apixaban (ELIQUIS) 5 MG TABS tablet Take 1 tablet (5 mg total) by mouth 2 (two) times daily.  Marland Kitchen aspirin EC 81 MG tablet Take 81 mg by mouth daily.  . Coenzyme Q-10 200 MG CAPS Take 1 capsule by mouth daily.  . Multiple Vitamin (MULTIVITAMIN) capsule Take by mouth.  . sacubitril-valsartan (ENTRESTO) 24-26 MG Take 1 tablet by mouth 2 (two) times daily.     Allergies:   Penicillin g   Social History   Socioeconomic History  . Marital status: Married    Spouse name: Not on file  . Number of children: Not on file  . Years of education: Not on file  . Highest education level: Not on file  Occupational History  . Not on file  Tobacco Use  . Smoking status: Never Smoker  . Smokeless tobacco: Never Used  Vaping Use  . Vaping Use: Never used  Substance and Sexual Activity  . Alcohol use: No  . Drug use: No  . Sexual activity: Not on file  Other Topics Concern  . Not on file  Social History Narrative  . Not on file   Social Determinants of Health   Financial Resource Strain:   . Difficulty of Paying Living Expenses: Not on file  Food Insecurity:   . Worried About Charity fundraiser in the Last Year: Not on file  . Ran Out of Food in the Last Year: Not on  file  Transportation Needs:   . Lack of Transportation (Medical): Not on file  . Lack of Transportation (Non-Medical): Not on file  Physical Activity:   . Days of Exercise per Week: Not on file  . Minutes of Exercise per Session: Not on file  Stress:   . Feeling of Stress : Not on file  Social Connections:   . Frequency of Communication with Friends and Family: Not on file  . Frequency of Social Gatherings with Friends and Family: Not on file  . Attends Religious Services: Not on file  . Active Member of Clubs or Organizations: Not on file  . Attends Archivist Meetings: Not on file  . Marital Status: Not on file     Family History: The patient's family history includes Heart attack in her father; Heart disease in her brother and father; Hypertension in her mother.  ROS:   Please see the  history of present illness.    All other systems reviewed and are negative.  EKGs/Labs/Other Studies Reviewed:    The following studies were reviewed today: I discussed my findings with the patient at length including echocardiogram.  IMPRESSIONS    1. Left ventricular ejection fraction, by estimation, is 35 to 40%. The  left ventricle has moderately decreased function. The left ventricle has  no regional wall motion abnormalities. Left ventricular diastolic  parameters are consistent with Grade I  diastolic dysfunction (impaired relaxation).  2. Right ventricular systolic function is normal. The right ventricular  size is normal. There is normal pulmonary artery systolic pressure.  3. The mitral valve is normal in structure. Mild mitral valve  regurgitation. No evidence of mitral stenosis.  4. Normal Sorin 21 prostesis function. Parameters stable with  acceleration time of 77 ms, Peak/Mean gradient of 35/20, AVA/BSA 0.38.  Normal parameters for this valve.. The aortic valve has been  repaired/replaced. Aortic valve regurgitation is not  visualized. No aortic stenosis is  present. There is a 21 mm Sorin valve  present in the aortic position.  5. The inferior vena cava is normal in size with greater than 50%  respiratory variability, suggesting right atrial pressure of 3 mmHg.    Recent Labs: 01/30/2020: Hemoglobin 13.5; Platelets 182; TSH 1.980 03/05/2020: ALT 11 05/17/2020: BUN 21; Creatinine, Ser 0.86; Potassium 4.2; Sodium 140  Recent Lipid Panel    Component Value Date/Time   CHOL 203 (H) 01/30/2020 1020   TRIG 172 (H) 01/30/2020 1020   HDL 64 01/30/2020 1020   CHOLHDL 3.2 01/30/2020 1020   CHOLHDL 2.7 07/14/2017 0241   VLDL 19 07/14/2017 0241   LDLCALC 109 (H) 01/30/2020 1020    Physical Exam:    VS:  BP 108/62   Pulse 74   Ht 5\' 7"  (1.702 m)   Wt 176 lb (79.8 kg)   SpO2 97%   BMI 27.57 kg/m     Wt Readings from Last 3 Encounters:  10/25/20 176 lb (79.8 kg)  05/17/20 172 lb (78 kg)  03/05/20 176 lb (79.8 kg)     GEN: Patient is in no acute distress HEENT: Normal NECK: No JVD; No carotid bruits LYMPHATICS: No lymphadenopathy CARDIAC: Hear sounds regular, 2/6 systolic murmur at the apex. RESPIRATORY:  Clear to auscultation without rales, wheezing or rhonchi  ABDOMEN: Soft, non-tender, non-distended MUSCULOSKELETAL:  No edema; No deformity  SKIN: Warm and dry NEUROLOGIC:  Alert and oriented x 3 PSYCHIATRIC:  Normal affect   Signed, [name], MD  10/25/2020 3:27 PM    [facility]

## 2020-10-25 NOTE — Patient Instructions (Signed)
Medication Instructions:  No medication changes. *If you need a refill on your cardiac medications before your next appointment, please call your pharmacy*   Lab Work: Your physician recommends that you have a BMET today in the office and again in 3 weeks (11/15/20).  If you have labs (blood work) drawn today and your tests are completely normal, you will receive your results only by: Marland Kitchen MyChart Message (if you have MyChart) OR . A paper copy in the mail If you have any lab test that is abnormal or we need to change your treatment, we will call you to review the results.   Testing/Procedures: None ordered   Follow-Up: At [facility], you and your health needs are our priority.  As part of our continuing mission to provide you with exceptional heart care, we have created designated Provider Care Teams.  These Care Teams include your primary Cardiologist (physician) and Advanced Practice Providers (APPs -  Physician Assistants and Nurse Practitioners) who all work together to provide you with the care you need, when you need it.  We recommend signing up for the patient portal called "MyChart".  Sign up information is provided on this After Visit Summary.  MyChart is used to connect with patients for Virtual Visits (Telemedicine).  Patients are able to view lab/test results, encounter notes, upcoming appointments, etc.  Non-urgent messages can be sent to your provider as well.   To learn more about what you can do with MyChart, go to [email or web address].    Your next appointment:   3 month(s)  The format for your next appointment:   In Person  Provider:   Jyl Heinz, MD   Other Instructions Dapagliflozin tablets What is this medicine? DAPAGLIFLOZIN (DAP a gli FLOE zin) controls blood sugar in people with diabetes. It is used with lifestyle changes like diet and exercise. It also treats heart failure. It may lower the need for treatment of heart failure in the  hospital. This medicine may be used for other purposes; ask your health care provider or pharmacist if you have questions. COMMON BRAND NAME(S): Wilder Glade What should I tell my health care provider before I take this medicine? They need to know if you have any of these conditions:  dehydration  diabetic ketoacidosis  diet low in salt  eating less due to illness, surgery, dieting, or any other reason  having surgery  history of pancreatitis or pancreas problems  history of yeast infection of the penis or vagina  if you often drink alcohol  infections in the bladder, kidneys, or urinary tract  kidney disease  low blood pressure  on hemodialysis  problems urinating  type 1 diabetes  uncircumcised female  an unusual or allergic reaction to dapagliflozin, other medicines, foods, dyes, or preservatives  pregnant or trying to get pregnant  breast-feeding How should I use this medicine? Take this medicine by mouth with a glass of water. Follow the directions on the prescription label. You can take it with or without food. If it upsets your stomach, take it with food. Take this medicine in the morning. Take your dose at the same time each day. Do not take more often than directed. Do not stop taking except on your doctor's advice. A special MedGuide will be given to you by the pharmacist with each prescription and refill. Be sure to read this information carefully each time. Talk to your pediatrician regarding the use of this medicine in children. Special care may be needed. Overdosage: If you think  you have taken too much of this medicine contact a poison control center or emergency room at once. NOTE: This medicine is only for you. Do not share this medicine with others. What if I miss a dose? If you miss a dose, take it as soon as you can. If it is almost time for your next dose, take only that dose. Do not take double or extra doses. What may interact with this medicine? Do  not take this medicine with any of the following medications:  gatifloxacin This medicine may also interact with the following medications:  alcohol  certain medicines for blood pressure, heart disease  diuretics  insulin  nateglinide  pioglitazone  quinolone antibiotics like ciprofloxacin, levofloxacin, ofloxacin  repaglinide  some herbal dietary supplements  steroid medicines like prednisone or cortisone  sulfonylureas like glimepiride, glipizide, glyburide  thyroid medicine This list may not describe all possible interactions. Give your health care provider a list of all the medicines, herbs, non-prescription drugs, or dietary supplements you use. Also tell them if you smoke, drink alcohol, or use illegal drugs. Some items may interact with your medicine. What should I watch for while using this medicine? Visit your doctor or health care professional for regular checks on your progress. This medicine can cause a serious condition in which there is too much acid in the blood. If you develop nausea, vomiting, stomach pain, unusual tiredness, or breathing problems, stop taking this medicine and call your doctor right away. If possible, use a ketone dipstick to check for ketones in your urine. A test called the HbA1C (A1C) will be monitored. This is a simple blood test. It measures your blood sugar control over the last 2 to 3 months. You will receive this test every 3 to 6 months. Learn how to check your blood sugar. Learn the symptoms of low and high blood sugar and how to manage them. Always carry a quick-source of sugar with you in case you have symptoms of low blood sugar. Examples include hard sugar candy or glucose tablets. Make sure others know that you can choke if you eat or drink when you develop serious symptoms of low blood sugar, such as seizures or unconsciousness. They must get medical help at once. Tell your doctor or health care professional if you have high blood  sugar. You might need to change the dose of your medicine. If you are sick or exercising more than usual, you might need to change the dose of your medicine. Do not skip meals. Ask your doctor or health care professional if you should avoid alcohol. Many nonprescription cough and cold products contain sugar or alcohol. These can affect blood sugar. Wear a medical ID bracelet or chain, and carry a card that describes your disease and details of your medicine and dosage times. What side effects may I notice from receiving this medicine? Side effects that you should report to your doctor or health care professional as soon as possible:  allergic reactions like skin rash, itching or hives, swelling of the face, lips, or tongue  breathing problems  dizziness  feeling faint or lightheaded, falls  muscle weakness  nausea, vomiting, unusual stomach upset or pain  new pain or tenderness, change in skin color, sores or ulcers, or infection in legs or feet  penile discharge, itching, or pain in men  signs and symptoms of a genital infection, such as fever; tenderness, redness, or swelling in the genitals or area from the genitals to the back of  the rectum  signs and symptoms of low blood sugar such as feeling anxious, confusion, dizziness, increased hunger, unusually weak or tired, sweating, shakiness, cold, irritable, headache, blurred vision, fast heartbeat, loss of consciousness  signs and symptoms of a urinary tract infection, such as fever, chills, a burning feeling when urinating, blood in the urine, back pain  trouble passing urine or change in the amount of urine, including an urgent need to urinate more often, in larger amounts, or at night  unusual tiredness  vaginal discharge, itching, or odor in women Side effects that usually do not require medical attention (report to your doctor or health care professional if they continue or are bothersome):  mild increase in  urination  thirsty This list may not describe all possible side effects. Call your doctor for medical advice about side effects. You may report side effects to FDA at 1-800-FDA-1088. Where should I keep my medicine? Keep out of the reach of children. Store at room temperature between 15 and 30 degrees C (59 and 86 degrees F). Throw away any unused medicine after the expiration date. NOTE: This sheet is a summary. It may not cover all possible information. If you have questions about this medicine, talk to your doctor, pharmacist, or health care provider.  2020 Elsevier/Gold Standard (2019-05-01 18:58:14)

## 2020-10-26 LAB — BASIC METABOLIC PANEL
BUN/Creatinine Ratio: 19 (ref 12–28)
BUN: 17 mg/dL (ref 8–27)
CO2: 23 mmol/L (ref 20–29)
Calcium: 9.3 mg/dL (ref 8.7–10.3)
Chloride: 103 mmol/L (ref 96–106)
Creatinine, Ser: 0.9 mg/dL (ref 0.57–1.00)
GFR calc Af Amer: 80 mL/min/{1.73_m2} (ref >59)
GFR calc non Af Amer: 70 mL/min/{1.73_m2} (ref >59)
Glucose: 102 mg/dL — ABNORMAL HIGH (ref 65–99)
Potassium: 4.3 mmol/L (ref 3.5–5.2)
Sodium: 141 mmol/L (ref 134–144)

## 2020-11-16 DIAGNOSIS — I429 Cardiomyopathy, unspecified: Secondary | ICD-10-CM | POA: Diagnosis not present

## 2020-11-17 LAB — BASIC METABOLIC PANEL
BUN/Creatinine Ratio: 26 (ref 12–28)
BUN: 21 mg/dL (ref 8–27)
CO2: 21 mmol/L (ref 20–29)
Calcium: 9.7 mg/dL (ref 8.7–10.3)
Chloride: 102 mmol/L (ref 96–106)
Creatinine, Ser: 0.82 mg/dL (ref 0.57–1.00)
GFR calc Af Amer: 90 mL/min/{1.73_m2} (ref >59)
GFR calc non Af Amer: 78 mL/min/{1.73_m2} (ref >59)
Glucose: 92 mg/dL (ref 65–99)
Potassium: 4.5 mmol/L (ref 3.5–5.2)
Sodium: 140 mmol/L (ref 134–144)

## 2021-01-18 ENCOUNTER — Other Ambulatory Visit: Payer: Self-pay | Admitting: Cardiology

## 2021-01-18 DIAGNOSIS — I429 Cardiomyopathy, unspecified: Secondary | ICD-10-CM

## 2021-01-19 NOTE — Telephone Encounter (Signed)
Rx refill sent to pharmacy. 

## 2021-01-21 DIAGNOSIS — I639 Cerebral infarction, unspecified: Secondary | ICD-10-CM | POA: Insufficient documentation

## 2021-01-21 DIAGNOSIS — I351 Nonrheumatic aortic (valve) insufficiency: Secondary | ICD-10-CM | POA: Insufficient documentation

## 2021-01-26 ENCOUNTER — Ambulatory Visit (INDEPENDENT_AMBULATORY_CARE_PROVIDER_SITE_OTHER): Payer: Medicare HMO | Admitting: Cardiology

## 2021-01-26 ENCOUNTER — Other Ambulatory Visit: Payer: Self-pay

## 2021-01-26 ENCOUNTER — Encounter: Payer: Self-pay | Admitting: Cardiology

## 2021-01-26 VITALS — BP 104/76 | HR 75 | Ht 67.0 in | Wt 177.6 lb

## 2021-01-26 DIAGNOSIS — E782 Mixed hyperlipidemia: Secondary | ICD-10-CM | POA: Diagnosis not present

## 2021-01-26 DIAGNOSIS — Z952 Presence of prosthetic heart valve: Secondary | ICD-10-CM

## 2021-01-26 DIAGNOSIS — Z5181 Encounter for therapeutic drug level monitoring: Secondary | ICD-10-CM

## 2021-01-26 DIAGNOSIS — I429 Cardiomyopathy, unspecified: Secondary | ICD-10-CM

## 2021-01-26 DIAGNOSIS — I10 Essential (primary) hypertension: Secondary | ICD-10-CM | POA: Diagnosis not present

## 2021-01-26 DIAGNOSIS — Z79899 Other long term (current) drug therapy: Secondary | ICD-10-CM

## 2021-01-26 DIAGNOSIS — Z9889 Other specified postprocedural states: Secondary | ICD-10-CM

## 2021-01-26 DIAGNOSIS — Z8679 Personal history of other diseases of the circulatory system: Secondary | ICD-10-CM | POA: Diagnosis not present

## 2021-01-26 NOTE — Progress Notes (Signed)
Cardiology Office Note:    Date:  01/26/2021   ID:  Stacy Jenkins, DOB 09/13/1961, MRN [id number]  PCP:  [name], MD  Cardiologist:  [name], MD   Referring MD: [name], MD    ASSESSMENT:    1. Benign essential hypertension   2. Cardiomyopathy, unspecified type ([place])   3. Mixed hyperlipidemia   4. Mixed dyslipidemia   5. S/P ascending aortic aneurysm repair   6. S/P AVR    PLAN:    In order of problems listed above:  1. Primary prevention stressed with the patient.  Importance of compliance with diet medication stressed and she vocalized understanding she was advised to walk at least half an hour a day and she promises to do so. 2. Essential hypertension: Blood pressure stable and diet was emphasized  3. Nonischemic cardiomyopathy: Patient is on guideline directed medical therapy.  We will check echocardiogram to see if there is any improvement in the systolic function.  She knows to check her weight on a regular basis. 4. 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  Echocardiogram will shed more light on this issue. 5. Patient will be seen in follow-up appointment in 6 months or earlier if the patient has any concerns.  She will come back for blood work in the next few days which will be complete blood work including fasting lipids   Medication Adjustments/Labs and Tests Ordered: Current medicines are reviewed at length with the patient today.  Concerns regarding medicines are outlined above.  No orders of the defined types were placed in this encounter.  No orders of the defined types were placed in this encounter.    No chief complaint on file.    History of Present Illness:    Stacy Jenkins is a 60 y.o. female.  Patient has past medical history of aortic valve replacement, ascending aortic aneurysm with dissection, essential hypertension nonischemic cardiomyopathy.  She denies any problems at this time  and takes care of activities of daily living.  No chest pain orthopnea or PND.  At the time of my evaluation, the patient is alert awake oriented and in no distress.  Past Medical History:  Diagnosis Date  . Abnormal echocardiogram 08/20/2018   Added automatically from request for surgery [id number]  . Acute blood loss anemia 11/19/2018  . Acute hyperglycemia 03/10/2016  . Acute post-operative pain 11/19/2018  . Aortic valve regurgitation 08/20/2018   Formatting of this note might be different from the original. Added automatically from request for surgery [id number]  . Aphasia   . Ascending aortic aneurysm ([place])   . Benign essential hypertension 08/09/2017   at goal on current meds  Formatting of this note might be different from the original. at goal on current meds  . Cardiomyopathy ([place]) 01/29/2018  . Dilated aortic root ([place]) 09/11/2012   Formatting of this note might be different from the original. 4.6 cm  . 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 Elevated blood-pressure reading without diagnosis of hypertension 01/29/2018   will monitor  Formatting of this note might be different from the original. will monitor  . Expressive aphasia 11/20/2018  . Hiatal hernia 01/29/2018  . History of atrial fibrillation 11/20/2018  . History of CVA (cerebrovascular accident) 08/09/2017  . History of migraine 01/29/2018  . Hyperlipidemia 01/08/2019  . Hypertension 01/08/2019  . Hypoglycemia 01/29/2018   dietary info  Formatting of this note might be different from the original. dietary  info  . Medicare annual wellness visit, subsequent 02/17/2019  . Mixed dyslipidemia 08/09/2017  . Nonrheumatic aortic valve insufficiency   . Other abnormal glucose 01/29/2018  . PFO (patent foramen ovale) 09/11/2012  . Postoperative atrial fibrillation ([place]) 11/20/2018  . S/P ascending aortic aneurysm repair 11/20/2018  . S/P AVR 11/19/2018  . Stroke (cerebrum) ([place]) - Left MCA branchStroke in a patient with history of prior  strokes and mild residual deficits, s/p tPA 07/13/2017  . Stroke ([place])   . Vitamin D deficiency 01/29/2018    Past Surgical History:  Procedure Laterality Date  . aortic valve surgery  02/2016  . TEE WITHOUT CARDIOVERSION N/A 07/16/2017   Procedure: TRANSESOPHAGEAL ECHOCARDIOGRAM (TEE);  Surgeon: Sanda Klein, MD;  Location: [facility];  Service: Cardiovascular;  Laterality: N/A;    Current Medications: Current Meds  Medication Sig  . apixaban (ELIQUIS) 5 MG TABS tablet Take 1 tablet (5 mg total) by mouth 2 (two) times daily.  Marland Kitchen aspirin EC 81 MG tablet Take 81 mg by mouth daily.  . Coenzyme Q-10 200 MG CAPS Take 1 capsule by mouth daily.  Marland Kitchen ENTRESTO 24-26 MG Take 1 tablet by mouth twice daily  . FARXIGA 5 MG TABS tablet TAKE 1 TABLET BY MOUTH ONCE DAILY BEFORE  BREAKFAST  . Multiple Vitamin (MULTIVITAMIN) capsule Take by mouth.     Allergies:   Penicillin g   Social History   Socioeconomic History  . Marital status: Married    Spouse name: Not on file  . Number of children: Not on file  . Years of education: Not on file  . Highest education level: Not on file  Occupational History  . Not on file  Tobacco Use  . Smoking status: Never Smoker  . Smokeless tobacco: Never Used  Vaping Use  . Vaping Use: Never used  Substance and Sexual Activity  . Alcohol use: No  . Drug use: No  . Sexual activity: Not on file  Other Topics Concern  . Not on file  Social History Narrative  . Not on file   Social Determinants of Health   Financial Resource Strain: Not on file  Food Insecurity: Not on file  Transportation Needs: Not on file  Physical Activity: Not on file  Stress: Not on file  Social Connections: Not on file     Family History: The patient's family history includes Heart attack in her father; Heart disease in her brother and father; Hypertension in her mother.  ROS:   Please see the history of present illness.    All other systems reviewed and are  negative.  EKGs/Labs/Other Studies Reviewed:    The following studies were reviewed today: I discussed my findings with the patient extensively   Recent Labs: 01/30/2020: Hemoglobin 13.5; Platelets 182; TSH 1.980 03/05/2020: ALT 11 11/16/2020: BUN 21; Creatinine, Ser 0.82; Potassium 4.5; Sodium 140  Recent Lipid Panel    Component Value Date/Time   CHOL 203 (H) 01/30/2020 1020   TRIG 172 (H) 01/30/2020 1020   HDL 64 01/30/2020 1020   CHOLHDL 3.2 01/30/2020 1020   CHOLHDL 2.7 07/14/2017 0241   VLDL 19 07/14/2017 0241   LDLCALC 109 (H) 01/30/2020 1020    Physical Exam:    VS:  BP 104/76   Pulse 75   Ht 5\' 7"  (1.702 m)   Wt 177 lb 9.6 oz (80.6 kg)   SpO2 94%   BMI 27.82 kg/m     Wt Readings from Last 3 Encounters:  01/26/21  177 lb 9.6 oz (80.6 kg)  10/25/20 176 lb (79.8 kg)  05/17/20 172 lb (78 kg)     GEN: Patient is in no acute distress HEENT: Normal NECK: No JVD; No carotid bruits LYMPHATICS: No lymphadenopathy CARDIAC: Hear sounds regular, 2/6 systolic murmur at the apex. RESPIRATORY:  Clear to auscultation without rales, wheezing or rhonchi  ABDOMEN: Soft, non-tender, non-distended MUSCULOSKELETAL:  No edema; No deformity  SKIN: Warm and dry NEUROLOGIC:  Alert and oriented x 3 PSYCHIATRIC:  Normal affect   Signed, [name], MD  01/26/2021 2:40 PM    [facility]

## 2021-01-26 NOTE — Patient Instructions (Signed)
Medication Instructions:  No medication changes. *If you need a refill on your cardiac medications before your next appointment, please call your pharmacy*   Lab Work: Your physician recommends that you return for lab work in: next few days. You need to have labs done when you are fasting.  You can come Monday through Friday 8:30 am to 12:00 pm and 1:15 to 4:30. You do not need to make an appointment as the order has already been placed. The labs you are going to have done are BMET, CBC, TSH, LFT and Lipids.  If you have labs (blood work) drawn today and your tests are completely normal, you will receive your results only by: Marland Kitchen MyChart Message (if you have MyChart) OR . A paper copy in the mail If you have any lab test that is abnormal or we need to change your treatment, we will call you to review the results.   Testing/Procedures: Your physician has requested that you have an echocardiogram. Echocardiography is a painless test that uses sound waves to create images of your heart. It provides your doctor with information about the size and shape of your heart and how well your heart's chambers and valves are working. This procedure takes approximately one hour. There are no restrictions for this procedure.    Follow-Up: At [facility], you and your health needs are our priority.  As part of our continuing mission to provide you with exceptional heart care, we have created designated Provider Care Teams.  These Care Teams include your primary Cardiologist (physician) and Advanced Practice Providers (APPs -  Physician Assistants and Nurse Practitioners) who all work together to provide you with the care you need, when you need it.  We recommend signing up for the patient portal called "MyChart".  Sign up information is provided on this After Visit Summary.  MyChart is used to connect with patients for Virtual Visits (Telemedicine).  Patients are able to view lab/test results, encounter  notes, upcoming appointments, etc.  Non-urgent messages can be sent to your provider as well.   To learn more about what you can do with MyChart, go to [email or web address].    Your next appointment:   4 month(s)  The format for your next appointment:   In Person  Provider:   Jyl Heinz, MD   Other Instructions  Echocardiogram An echocardiogram is a test that uses sound waves (ultrasound) to produce images of the heart. Images from an echocardiogram can provide important information about:  Heart size and shape.  The size and thickness and movement of your heart's walls.  Heart muscle function and strength.  Heart valve function or if you have stenosis. Stenosis is when the heart valves are too narrow.  If blood is flowing backward through the heart valves (regurgitation).  A tumor or infectious growth around the heart valves.  Areas of heart muscle that are not working well because of poor blood flow or injury from a heart attack.  Aneurysm detection. An aneurysm is a weak or damaged part of an artery wall. The wall bulges out from the normal force of blood pumping through the body. Tell a health care provider about:  Any allergies you have.  All medicines you are taking, including vitamins, herbs, eye drops, creams, and over-the-counter medicines.  Any blood disorders you have.  Any surgeries you have had.  Any medical conditions you have.  Whether you are pregnant or may be pregnant. What are the risks? Generally, this is a safe  test. However, problems may occur, including an allergic reaction to dye (contrast) that may be used during the test. What happens before the test? No specific preparation is needed. You may eat and drink normally. What happens during the test?  You will take off your clothes from the waist up and put on a hospital gown.  Electrodes or electrocardiogram (ECG)patches may be placed on your chest. The electrodes or patches are  then connected to a device that monitors your heart rate and rhythm.  You will lie down on a table for an ultrasound exam. A gel will be applied to your chest to help sound waves pass through your skin.  A handheld device, called a transducer, will be pressed against your chest and moved over your heart. The transducer produces sound waves that travel to your heart and bounce back (or "echo" back) to the transducer. These sound waves will be captured in real-time and changed into images of your heart that can be viewed on a video monitor. The images will be recorded on a computer and reviewed by your health care provider.  You may be asked to change positions or hold your breath for a short time. This makes it easier to get different views or better views of your heart.  In some cases, you may receive contrast through an IV in one of your veins. This can improve the quality of the pictures from your heart. The procedure may vary among health care providers and hospitals.   What can I expect after the test? You may return to your normal, everyday life, including diet, activities, and medicines, unless your health care provider tells you not to do that. Follow these instructions at home:  It is up to you to get the results of your test. Ask your health care provider, or the department that is doing the test, when your results will be ready.  Keep all follow-up visits. This is important. Summary  An echocardiogram is a test that uses sound waves (ultrasound) to produce images of the heart.  Images from an echocardiogram can provide important information about the size and shape of your heart, heart muscle function, heart valve function, and other possible heart problems.  You do not need to do anything to prepare before this test. You may eat and drink normally.  After the echocardiogram is completed, you may return to your normal, everyday life, unless your health care provider tells you not  to do that. This information is not intended to replace advice given to you by your health care provider. Make sure you discuss any questions you have with your health care provider. Document Revised: 08/03/2020 Document Reviewed: 08/03/2020 Elsevier Patient Education  2021 [employer].

## 2021-01-27 DIAGNOSIS — E782 Mixed hyperlipidemia: Secondary | ICD-10-CM | POA: Diagnosis not present

## 2021-01-27 DIAGNOSIS — I10 Essential (primary) hypertension: Secondary | ICD-10-CM | POA: Diagnosis not present

## 2021-01-27 MED ORDER — DAPAGLIFLOZIN PROPANEDIOL 5 MG PO TABS: ORAL_TABLET | ORAL | 3 refills | Status: DC

## 2021-01-27 NOTE — Addendum Note (Signed)
Addended by: Truddie Hidden on: 01/27/2021 11:24 AM   Modules accepted: Orders

## 2021-01-28 LAB — CBC WITH DIFFERENTIAL/PLATELET
Basophils Absolute: 0 10*3/uL (ref 0.0–0.2)
Basos: 1 %
EOS (ABSOLUTE): 0.1 10*3/uL (ref 0.0–0.4)
Eos: 3 %
Hematocrit: 38.6 % (ref 34.0–46.6)
Hemoglobin: 12.8 g/dL (ref 11.1–15.9)
Immature Grans (Abs): 0 10*3/uL (ref 0.0–0.1)
Immature Granulocytes: 0 %
Lymphocytes Absolute: 1.5 10*3/uL (ref 0.7–3.1)
Lymphs: 43 %
MCH: 30 pg (ref 26.6–33.0)
MCHC: 33.2 g/dL (ref 31.5–35.7)
MCV: 90 fL (ref 79–97)
Monocytes Absolute: 0.3 10*3/uL (ref 0.1–0.9)
Monocytes: 10 %
Neutrophils Absolute: 1.5 10*3/uL (ref 1.4–7.0)
Neutrophils: 43 %
Platelets: 176 10*3/uL (ref 150–450)
RBC: 4.27 x10E6/uL (ref 3.77–5.28)
RDW: 11.2 % — ABNORMAL LOW (ref 11.7–15.4)
WBC: 3.4 10*3/uL (ref 3.4–10.8)

## 2021-01-28 LAB — LIPID PANEL
Chol/HDL Ratio: 3 ratio (ref 0.0–4.4)
Cholesterol, Total: 198 mg/dL (ref 100–199)
HDL: 67 mg/dL (ref >39)
LDL Chol Calc (NIH): 107 mg/dL — ABNORMAL HIGH (ref 0–99)
Triglycerides: 139 mg/dL (ref 0–149)
VLDL Cholesterol Cal: 24 mg/dL (ref 5–40)

## 2021-01-28 LAB — BASIC METABOLIC PANEL
BUN/Creatinine Ratio: 21 (ref 12–28)
BUN: 17 mg/dL (ref 8–27)
CO2: 25 mmol/L (ref 20–29)
Calcium: 9.4 mg/dL (ref 8.7–10.3)
Chloride: 104 mmol/L (ref 96–106)
Creatinine, Ser: 0.8 mg/dL (ref 0.57–1.00)
GFR calc Af Amer: 92 mL/min/{1.73_m2} (ref >59)
GFR calc non Af Amer: 80 mL/min/{1.73_m2} (ref >59)
Glucose: 88 mg/dL (ref 65–99)
Potassium: 4.2 mmol/L (ref 3.5–5.2)
Sodium: 140 mmol/L (ref 134–144)

## 2021-01-28 LAB — HEPATIC FUNCTION PANEL
ALT: 13 IU/L (ref 0–32)
AST: 20 IU/L (ref 0–40)
Albumin: 4.5 g/dL (ref 3.8–4.8)
Alkaline Phosphatase: 114 IU/L (ref 44–121)
Bilirubin Total: 0.6 mg/dL (ref 0.0–1.2)
Bilirubin, Direct: 0.18 mg/dL (ref 0.00–0.40)
Total Protein: 7.2 g/dL (ref 6.0–8.5)

## 2021-01-28 LAB — TSH: TSH: 2.18 u[IU]/mL (ref 0.450–4.500)

## 2021-01-31 MED ORDER — ROSUVASTATIN CALCIUM 5 MG PO TABS: 5.0000 mg | ORAL_TABLET | Freq: Every day | ORAL | 3 refills | Status: DC

## 2021-01-31 NOTE — Addendum Note (Signed)
Addended by: Truddie Hidden on: 01/31/2021 02:28 PM   Modules accepted: Orders

## 2021-02-01 DIAGNOSIS — H5213 Myopia, bilateral: Secondary | ICD-10-CM | POA: Diagnosis not present

## 2021-02-15 DIAGNOSIS — H524 Presbyopia: Secondary | ICD-10-CM | POA: Diagnosis not present

## 2021-02-15 DIAGNOSIS — H5213 Myopia, bilateral: Secondary | ICD-10-CM | POA: Diagnosis not present

## 2021-02-15 DIAGNOSIS — H52209 Unspecified astigmatism, unspecified eye: Secondary | ICD-10-CM | POA: Diagnosis not present

## 2021-02-19 ENCOUNTER — Other Ambulatory Visit: Payer: Self-pay | Admitting: Cardiology

## 2021-02-24 ENCOUNTER — Other Ambulatory Visit: Payer: Medicare HMO

## 2021-03-22 ENCOUNTER — Ambulatory Visit (INDEPENDENT_AMBULATORY_CARE_PROVIDER_SITE_OTHER): Payer: Medicare HMO

## 2021-03-22 ENCOUNTER — Other Ambulatory Visit: Payer: Self-pay

## 2021-03-22 DIAGNOSIS — I10 Essential (primary) hypertension: Secondary | ICD-10-CM | POA: Diagnosis not present

## 2021-03-22 DIAGNOSIS — I429 Cardiomyopathy, unspecified: Secondary | ICD-10-CM | POA: Diagnosis not present

## 2021-03-22 LAB — ECHOCARDIOGRAM COMPLETE
AR max vel: 0.89 cm2
AV Area VTI: 0.73 cm2
AV Area mean vel: 0.77 cm2
AV Mean grad: 20 mmHg
AV Peak grad: 32.7 mmHg
Ao pk vel: 2.86 m/s
Area-P 1/2: 3.13 cm2
Calc EF: 28.5 %
MV M vel: 5.43 m/s
MV Peak grad: 117.9 mmHg
Radius: 0.7 cm
S' Lateral: 5.3 cm
Single Plane A2C EF: 30.6 %
Single Plane A4C EF: 27.6 %

## 2021-03-22 NOTE — Progress Notes (Signed)
Complete echocardiogram performed.  Jimmy Finnbar Cedillos RDCS, RVT  

## 2021-05-31 ENCOUNTER — Ambulatory Visit: Payer: Medicare HMO | Admitting: Cardiology

## 2021-06-02 ENCOUNTER — Other Ambulatory Visit: Payer: Self-pay

## 2021-06-02 DIAGNOSIS — I429 Cardiomyopathy, unspecified: Secondary | ICD-10-CM

## 2021-06-02 MED ORDER — DAPAGLIFLOZIN PROPANEDIOL 5 MG PO TABS: ORAL_TABLET | ORAL | 1 refills | Status: DC

## 2021-06-02 NOTE — Telephone Encounter (Signed)
Refill of Farxiga 5 mg sent to [facility].

## 2021-07-13 ENCOUNTER — Other Ambulatory Visit: Payer: Self-pay

## 2021-07-14 ENCOUNTER — Ambulatory Visit: Payer: Medicare HMO | Admitting: Cardiology

## 2021-07-14 ENCOUNTER — Encounter: Payer: Self-pay | Admitting: Cardiology

## 2021-07-14 ENCOUNTER — Other Ambulatory Visit: Payer: Self-pay

## 2021-07-14 VITALS — BP 114/68 | HR 70 | Ht 67.0 in | Wt 173.2 lb

## 2021-07-14 DIAGNOSIS — I10 Essential (primary) hypertension: Secondary | ICD-10-CM

## 2021-07-14 DIAGNOSIS — E559 Vitamin D deficiency, unspecified: Secondary | ICD-10-CM

## 2021-07-14 DIAGNOSIS — Z8673 Personal history of transient ischemic attack (TIA), and cerebral infarction without residual deficits: Secondary | ICD-10-CM

## 2021-07-14 DIAGNOSIS — E782 Mixed hyperlipidemia: Secondary | ICD-10-CM

## 2021-07-14 DIAGNOSIS — I428 Other cardiomyopathies: Secondary | ICD-10-CM

## 2021-07-14 DIAGNOSIS — Z952 Presence of prosthetic heart valve: Secondary | ICD-10-CM | POA: Diagnosis not present

## 2021-07-14 DIAGNOSIS — Z9889 Other specified postprocedural states: Secondary | ICD-10-CM

## 2021-07-14 DIAGNOSIS — Z1329 Encounter for screening for other suspected endocrine disorder: Secondary | ICD-10-CM | POA: Diagnosis not present

## 2021-07-14 DIAGNOSIS — Z8679 Personal history of other diseases of the circulatory system: Secondary | ICD-10-CM

## 2021-07-14 NOTE — Patient Instructions (Signed)

## 2021-07-14 NOTE — Progress Notes (Signed)
Cardiology Office Note:    Date:  07/14/2021   ID:  Stacy Jenkins, DOB July 05, 1961, MRN [id number]  PCP:  Stacy Broker, MD  Cardiologist:  [name], MD   Referring MD: Stacy Broker, MD    ASSESSMENT:    1. Other cardiomyopathy ([place])   2. Mixed hyperlipidemia   3. S/P ascending aortic aneurysm repair   4. S/P AVR   5. Mixed dyslipidemia   6. History of CVA (cerebrovascular accident)   7. History of atrial fibrillation    PLAN:    In order of problems listed above:  Primary prevention stressed with the patient.  Importance of compliance with diet medication stressed and she vocalized understanding.  She was advised to walk on a regular basis at least half an hour and she promises to do so. Paroxysmal atrial fibrillation:I discussed with the patient atrial fibrillation, disease process. Management and therapy including rate and rhythm control, anticoagulation benefits and potential risks were discussed extensively with the patient. Patient had multiple questions which were answered to patient's satisfaction. Cardiomyopathy: Ejection fraction is stable and she is on guideline directed medical therapy.  Diet and regular weight checks was discussed and she agrees to do so. Mixed dyslipidemia: Diet was emphasized and lipids were reviewed.  Lifestyle modification urged. Patient will be seen in follow-up appointment in 6 months or earlier if the patient has any concerns.  Patient is fasting and will have complete blood work including fasting lipids.   Medication Adjustments/Labs and Tests Ordered: Current medicines are reviewed at length with the patient today.  Concerns regarding medicines are outlined above.  No orders of the defined types were placed in this encounter.  No orders of the defined types were placed in this encounter.    No chief complaint on file.    History of Present Illness:    Stacy Jenkins is a 60 y.o. female.  Patient has past medical  history of ascending aortic aneurysm.  Post repair and aortic valve replacement.  She has history of paroxysmal atrial fibrillation, Cardiomyopathy and history of stroke.  She denies any problems at this time and takes care of activities of daily living.  No chest pain orthopnea or PND.  At the time of my evaluation, the patient is alert awake oriented and in no distress.  Past Medical History:  Diagnosis Date   Abnormal echocardiogram 08/20/2018   Added automatically from request for surgery [id number]   Acute blood loss anemia 11/19/2018   Acute hyperglycemia 03/10/2016   Acute post-operative pain 11/19/2018   Aortic valve regurgitation 08/20/2018   Formatting of this note might be different from the original. Added automatically from request for surgery [id number]   Aphasia    Ascending aortic aneurysm ([place])    Benign essential hypertension 08/09/2017   at goal on current meds  Formatting of this note might be different from the original. at goal on current meds   Cardiomyopathy ([place]) 01/29/2018   Dilated aortic root ([place]) 09/11/2012   Formatting of this note might be different from the original. 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 will monitor   Expressive aphasia 11/20/2018   Hiatal hernia 01/29/2018   History of atrial fibrillation 11/20/2018   History of CVA (cerebrovascular accident) 08/09/2017   History of migraine 01/29/2018   Hyperlipidemia 01/08/2019  Hypertension 01/08/2019   Hypoglycemia 01/29/2018   dietary info  Formatting of this note might be different from the original. dietary info   Medicare annual wellness visit, subsequent 02/17/2019   Mixed dyslipidemia 08/09/2017   Nonrheumatic aortic valve insufficiency    Other abnormal glucose 01/29/2018   PFO (patent foramen ovale) 09/11/2012   Postoperative atrial  fibrillation ([place]) 11/20/2018   S/P ascending aortic aneurysm repair 11/20/2018   S/P AVR 11/19/2018   Stroke (cerebrum) ([place]) - Left MCA branchStroke in a patient with history of prior strokes and mild residual deficits, s/p tPA 07/13/2017   Stroke ([place])    Vitamin D deficiency 01/29/2018    Past Surgical History:  Procedure Laterality Date   aortic valve surgery  02/2016   TEE WITHOUT CARDIOVERSION N/A 07/16/2017   Procedure: TRANSESOPHAGEAL ECHOCARDIOGRAM (TEE);  Surgeon: Stacy Klein, MD;  Location: [facility];  Service: Cardiovascular;  Laterality: N/A;    Current Medications: Current Meds  Medication Sig   apixaban (ELIQUIS) 5 MG TABS tablet Take 1 tablet (5 mg total) by mouth 2 (two) times daily.   aspirin EC 81 MG tablet Take 81 mg by mouth daily.   Coenzyme Q-10 200 MG CAPS Take 1 capsule by mouth daily.   dapagliflozin propanediol (FARXIGA) 5 MG TABS tablet Take 5 mg by mouth daily.   ENTRESTO 24-26 MG Take 1 tablet by mouth twice daily   metoprolol succinate (TOPROL-XL) 50 MG 24 hr tablet Take 50 mg by mouth daily.   Multiple Vitamin (MULTIVITAMIN) capsule Take 1 capsule by mouth daily.   rosuvastatin (CRESTOR) 5 MG tablet Take 5 mg by mouth daily.     Allergies:   Penicillin g   Social History   Socioeconomic History   Marital status: Married    Spouse name: Not on file   Number of children: Not on file   Years of education: Not on file   Highest education level: Not on file  Occupational History   Not on file  Tobacco Use   Smoking status: Never   Smokeless tobacco: Never  Vaping Use   Vaping Use: Never used  Substance and Sexual Activity   Alcohol use: No   Drug use: No   Sexual activity: Not on file  Other Topics Concern   Not on file  Social History Narrative   Not on file   Social Determinants of Health   Financial Resource Strain: Not on file  Food Insecurity: Not on file  Transportation Needs: Not on file  Physical Activity: Not on file   Stress: Not on file  Social Connections: Not on file     Family History: The patient's family history includes Heart attack in her father; Heart disease in her brother and father; Hypertension in her mother.  ROS:   Please see the history of present illness.    All other systems reviewed and are negative.  EKGs/Labs/Other Studies Reviewed:    The following studies were reviewed today: I discussed my findings with the patient at length.  Echocardiogram report from last visit was discussed.   Recent Labs: 01/27/2021: ALT 13; BUN 17; Creatinine, Ser 0.80; Hemoglobin 12.8; Platelets 176; Potassium 4.2; Sodium 140; TSH 2.180  Recent Lipid Panel    Component Value Date/Time   CHOL 198 01/27/2021 1119   TRIG 139 01/27/2021 1119   HDL 67 01/27/2021 1119   CHOLHDL 3.0 01/27/2021 1119   CHOLHDL 2.7 07/14/2017 0241   VLDL 19 07/14/2017 0241   LDLCALC 107 (H)  01/27/2021 1119    Physical Exam:    VS:  BP 114/68   Pulse 70   Ht 5\' 7"  (1.702 m)   Wt 173 lb 3.2 oz (78.6 kg)   SpO2 96%   BMI 27.13 kg/m     Wt Readings from Last 3 Encounters:  07/14/21 173 lb 3.2 oz (78.6 kg)  01/26/21 177 lb 9.6 oz (80.6 kg)  10/25/20 176 lb (79.8 kg)     GEN: Patient is in no acute distress HEENT: Normal NECK: No JVD; No carotid bruits LYMPHATICS: No lymphadenopathy CARDIAC: Hear sounds regular, 2/6 systolic murmur at the apex. RESPIRATORY:  Clear to auscultation without rales, wheezing or rhonchi  ABDOMEN: Soft, non-tender, non-distended MUSCULOSKELETAL:  No edema; No deformity  SKIN: Warm and dry NEUROLOGIC:  Alert and oriented x 3 PSYCHIATRIC:  Normal affect   Signed, [name], MD  07/14/2021 9:41 AM    [place]

## 2021-07-15 LAB — LIPID PANEL
Chol/HDL Ratio: 2.4 ratio (ref 0.0–4.4)
Cholesterol, Total: 154 mg/dL (ref 100–199)
HDL: 63 mg/dL (ref >39)
LDL Chol Calc (NIH): 72 mg/dL (ref 0–99)
Triglycerides: 107 mg/dL (ref 0–149)
VLDL Cholesterol Cal: 19 mg/dL (ref 5–40)

## 2021-07-15 LAB — CBC WITH DIFFERENTIAL/PLATELET
Basophils Absolute: 0 10*3/uL (ref 0.0–0.2)
Basos: 1 %
EOS (ABSOLUTE): 0.1 10*3/uL (ref 0.0–0.4)
Eos: 3 %
Hematocrit: 37.4 % (ref 34.0–46.6)
Hemoglobin: 12.7 g/dL (ref 11.1–15.9)
Immature Grans (Abs): 0 10*3/uL (ref 0.0–0.1)
Immature Granulocytes: 0 %
Lymphocytes Absolute: 1.6 10*3/uL (ref 0.7–3.1)
Lymphs: 42 %
MCH: 30.4 pg (ref 26.6–33.0)
MCHC: 34 g/dL (ref 31.5–35.7)
MCV: 90 fL (ref 79–97)
Monocytes Absolute: 0.4 10*3/uL (ref 0.1–0.9)
Monocytes: 11 %
Neutrophils Absolute: 1.7 10*3/uL (ref 1.4–7.0)
Neutrophils: 43 %
Platelets: 155 10*3/uL (ref 150–450)
RBC: 4.18 x10E6/uL (ref 3.77–5.28)
RDW: 12.2 % (ref 11.7–15.4)
WBC: 3.9 10*3/uL (ref 3.4–10.8)

## 2021-07-15 LAB — BASIC METABOLIC PANEL
BUN/Creatinine Ratio: 25 (ref 12–28)
BUN: 21 mg/dL (ref 8–27)
CO2: 22 mmol/L (ref 20–29)
Calcium: 9.1 mg/dL (ref 8.7–10.3)
Chloride: 106 mmol/L (ref 96–106)
Creatinine, Ser: 0.84 mg/dL (ref 0.57–1.00)
Glucose: 88 mg/dL (ref 65–99)
Potassium: 4.1 mmol/L (ref 3.5–5.2)
Sodium: 141 mmol/L (ref 134–144)
eGFR: 79 mL/min/{1.73_m2} (ref >59)

## 2021-07-15 LAB — HEPATIC FUNCTION PANEL
ALT: 8 IU/L (ref 0–32)
AST: 17 IU/L (ref 0–40)
Albumin: 4.4 g/dL (ref 3.8–4.8)
Alkaline Phosphatase: 106 IU/L (ref 44–121)
Bilirubin Total: 0.5 mg/dL (ref 0.0–1.2)
Bilirubin, Direct: 0.17 mg/dL (ref 0.00–0.40)
Total Protein: 6.8 g/dL (ref 6.0–8.5)

## 2021-07-15 LAB — TSH: TSH: 2.38 u[IU]/mL (ref 0.450–4.500)

## 2021-07-15 LAB — VITAMIN D 25 HYDROXY (VIT D DEFICIENCY, FRACTURES): Vit D, 25-Hydroxy: 32.7 ng/mL (ref 30.0–100.0)

## 2021-08-16 DIAGNOSIS — M9904 Segmental and somatic dysfunction of sacral region: Secondary | ICD-10-CM | POA: Diagnosis not present

## 2021-08-16 DIAGNOSIS — M9903 Segmental and somatic dysfunction of lumbar region: Secondary | ICD-10-CM | POA: Diagnosis not present

## 2021-08-16 DIAGNOSIS — M791 Myalgia, unspecified site: Secondary | ICD-10-CM | POA: Diagnosis not present

## 2021-08-16 DIAGNOSIS — M5441 Lumbago with sciatica, right side: Secondary | ICD-10-CM | POA: Diagnosis not present

## 2021-08-16 DIAGNOSIS — M4126 Other idiopathic scoliosis, lumbar region: Secondary | ICD-10-CM | POA: Diagnosis not present

## 2021-08-18 DIAGNOSIS — M9903 Segmental and somatic dysfunction of lumbar region: Secondary | ICD-10-CM | POA: Diagnosis not present

## 2021-08-18 DIAGNOSIS — M5441 Lumbago with sciatica, right side: Secondary | ICD-10-CM | POA: Diagnosis not present

## 2021-08-18 DIAGNOSIS — M791 Myalgia, unspecified site: Secondary | ICD-10-CM | POA: Diagnosis not present

## 2021-08-18 DIAGNOSIS — M4126 Other idiopathic scoliosis, lumbar region: Secondary | ICD-10-CM | POA: Diagnosis not present

## 2021-08-18 DIAGNOSIS — M9904 Segmental and somatic dysfunction of sacral region: Secondary | ICD-10-CM | POA: Diagnosis not present

## 2021-08-26 DIAGNOSIS — Z952 Presence of prosthetic heart valve: Secondary | ICD-10-CM | POA: Diagnosis not present

## 2021-08-26 DIAGNOSIS — Z8679 Personal history of other diseases of the circulatory system: Secondary | ICD-10-CM | POA: Diagnosis not present

## 2021-08-26 DIAGNOSIS — I712 Thoracic aortic aneurysm, without rupture: Secondary | ICD-10-CM | POA: Diagnosis not present

## 2021-08-26 DIAGNOSIS — I340 Nonrheumatic mitral (valve) insufficiency: Secondary | ICD-10-CM | POA: Diagnosis not present

## 2021-08-26 DIAGNOSIS — Z9889 Other specified postprocedural states: Secondary | ICD-10-CM | POA: Diagnosis not present

## 2021-08-26 DIAGNOSIS — I7103 Dissection of thoracoabdominal aorta: Secondary | ICD-10-CM | POA: Diagnosis not present

## 2021-11-27 ENCOUNTER — Other Ambulatory Visit: Payer: Self-pay | Admitting: Cardiology

## 2021-12-08 ENCOUNTER — Other Ambulatory Visit: Payer: Self-pay | Admitting: Cardiology

## 2021-12-19 ENCOUNTER — Other Ambulatory Visit: Payer: Self-pay | Admitting: Cardiology

## 2021-12-25 ENCOUNTER — Other Ambulatory Visit: Payer: Self-pay | Admitting: Cardiology

## 2022-01-18 ENCOUNTER — Other Ambulatory Visit: Payer: Self-pay | Admitting: Cardiology

## 2022-01-19 ENCOUNTER — Ambulatory Visit: Payer: Medicare HMO | Admitting: Cardiology

## 2022-01-19 ENCOUNTER — Other Ambulatory Visit: Payer: Self-pay

## 2022-01-19 VITALS — BP 108/88 | HR 86 | Ht 67.0 in | Wt 174.6 lb

## 2022-01-19 DIAGNOSIS — Z9889 Other specified postprocedural states: Secondary | ICD-10-CM

## 2022-01-19 DIAGNOSIS — E559 Vitamin D deficiency, unspecified: Secondary | ICD-10-CM | POA: Diagnosis not present

## 2022-01-19 DIAGNOSIS — I10 Essential (primary) hypertension: Secondary | ICD-10-CM

## 2022-01-19 DIAGNOSIS — E782 Mixed hyperlipidemia: Secondary | ICD-10-CM | POA: Diagnosis not present

## 2022-01-19 DIAGNOSIS — Z952 Presence of prosthetic heart valve: Secondary | ICD-10-CM | POA: Diagnosis not present

## 2022-01-19 DIAGNOSIS — I428 Other cardiomyopathies: Secondary | ICD-10-CM | POA: Diagnosis not present

## 2022-01-19 DIAGNOSIS — Z8679 Personal history of other diseases of the circulatory system: Secondary | ICD-10-CM

## 2022-01-19 NOTE — Progress Notes (Signed)
Cardiology Office Note:    Date:  01/19/2022   ID:  Stacy Jenkins, DOB 09/13/1961, MRN [id number]  PCP:  [name], MD  Cardiologist:  [name], MD   Referring MD: [name], MD    ASSESSMENT:    1. Other cardiomyopathy ([place])   2. Mixed hyperlipidemia   3. Primary hypertension   4. Mixed dyslipidemia   5. S/P AVR   6. S/P ascending aortic aneurysm repair    PLAN:    In order of problems listed above:  Primary prevention stressed with the patient.  Importance of compliance with diet medication stressed and she vocalized understanding.  She was advised to walk at least half an hour a day on a daily basis and she promises to do so. Aortic valve replacement and ascending aortic aneurysm repair: Stable at this time.  I reviewed [employer] records and discussed with the patient at length. Cardiomyopathy: Stable at this time.  Patient is on guideline directed medical therapy.  Limiting factor in her medications is low blood pressure. Mixed dyslipidemia: Stable at this time and on statin therapy.  Not clear whether she is taking statin and we will check this and get back to [place]. Patient is fasting and will have complete blood work today.  We will do a hemoglobin A1c and also vitamin D because of deficiency history. Patient will be seen in follow-up appointment in 6 months or earlier if the patient has any concerns   Medication Adjustments/Labs and Tests Ordered: Current medicines are reviewed at length with the patient today.  Concerns regarding medicines are outlined above.  No orders of the defined types were placed in this encounter.  No orders of the defined types were placed in this encounter.    Chief Complaint  Patient presents with   Follow-up     History of Present Illness:    Stacy Jenkins is a 61 y.o. female.  Patient has past medical history of aortic ascending aneurysm, postrepair aortic valve replacement, essential hypertension,  dyslipidemia, paroxysmal atrial fibrillation and cardiomyopathy.  She denies any problems at this time and takes care of activities of daily living.  No chest pain orthopnea or PND.  She is not on beta-blocker because of borderline blood pressure.  At the time of my evaluation, the patient is alert awake oriented and in no distress.  Past Medical History:  Diagnosis Date   Abnormal echocardiogram 08/20/2018   Added automatically from request for surgery [id number]   Acute blood loss anemia 11/19/2018   Acute hyperglycemia 03/10/2016   Acute post-operative pain 11/19/2018   Aortic valve regurgitation 08/20/2018   Formatting of this note might be different from the original. Added automatically from request for surgery [id number]   Aphasia    Ascending aortic aneurysm    Benign essential hypertension 08/09/2017   at goal on current meds  Formatting of this note might be different from the original. at goal on current meds   Cardiomyopathy ([place]) 01/29/2018   Dilated aortic root ([place]) 09/11/2012   Formatting of this note might be different from the original. 4.6 cm   Elevated blood-pressure reading without diagnosis of hypertension 01/29/2018   will monitor  Formatting of this note might be different from the original. will monitor   Expressive aphasia 11/20/2018   Hiatal hernia 01/29/2018   History of atrial fibrillation 11/20/2018   History of CVA (cerebrovascular accident) 08/09/2017   History of migraine 01/29/2018   Hyperlipidemia 01/08/2019   Hypertension  01/08/2019   Hypoglycemia 01/29/2018   dietary info  Formatting of this note might be different from the original. dietary info   Medicare annual wellness visit, subsequent 02/17/2019   Mixed dyslipidemia 08/09/2017   Nonrheumatic aortic valve insufficiency    Other abnormal glucose 01/29/2018   PFO (patent foramen ovale) 09/11/2012   Postoperative atrial fibrillation ([place]) 11/20/2018   S/P ascending aortic aneurysm repair 11/20/2018    S/P AVR 11/19/2018   Stroke (cerebrum) ([place]) - Left MCA branchStroke in a patient with history of prior strokes and mild residual deficits, s/p tPA 07/13/2017   Stroke ([place])    Vitamin D deficiency 01/29/2018    Past Surgical History:  Procedure Laterality Date   aortic valve surgery  02/2016   TEE WITHOUT CARDIOVERSION N/A 07/16/2017   Procedure: TRANSESOPHAGEAL ECHOCARDIOGRAM (TEE);  Surgeon: Sanda Klein, MD;  Location: [facility];  Service: Cardiovascular;  Laterality: N/A;    Current Medications: Current Meds  Medication Sig   apixaban (ELIQUIS) 5 MG TABS tablet Take 1 tablet (5 mg total) by mouth 2 (two) times daily.   aspirin EC 81 MG tablet Take 81 mg by mouth daily.   Coenzyme Q-10 200 MG CAPS Take 1 capsule by mouth daily.   FARXIGA 5 MG TABS tablet TAKE 1 TABLET BY MOUTH ONCE DAILY BEFORE BREAKFAST (Patient taking differently: Take 5 mg by mouth daily.)   Multiple Vitamin (MULTIVITAMIN) capsule Take 1 capsule by mouth daily. Unknown strenght   sacubitril-valsartan (ENTRESTO) 24-26 MG Take 1 tablet by mouth 2 (two) times daily.     Allergies:   Penicillin g   Social History   Socioeconomic History   Marital status: Married    Spouse name: Not on file   Number of children: Not on file   Years of education: Not on file   Highest education level: Not on file  Occupational History   Not on file  Tobacco Use   Smoking status: Never   Smokeless tobacco: Never  Vaping Use   Vaping Use: Never used  Substance and Sexual Activity   Alcohol use: No   Drug use: No   Sexual activity: Not on file  Other Topics Concern   Not on file  Social History Narrative   Not on file   Social Determinants of Health   Financial Resource Strain: Not on file  Food Insecurity: Not on file  Transportation Needs: Not on file  Physical Activity: Not on file  Stress: Not on file  Social Connections: Not on file     Family History: The patient's family history includes Heart  attack in her father; Heart disease in her brother and father; Hypertension in her mother.  ROS:   Please see the history of present illness.    All other systems reviewed and are negative.  EKGs/Labs/Other Studies Reviewed:    The following studies were reviewed today: I discussed my findings with the patient at length.   Recent Labs: 07/14/2021: ALT 8; BUN 21; Creatinine, Ser 0.84; Hemoglobin 12.7; Platelets 155; Potassium 4.1; Sodium 141; TSH 2.380  Recent Lipid Panel    Component Value Date/Time   CHOL 154 07/14/2021 0953   TRIG 107 07/14/2021 0953   HDL 63 07/14/2021 0953   CHOLHDL 2.4 07/14/2021 0953   CHOLHDL 2.7 07/14/2017 0241   VLDL 19 07/14/2017 0241   LDLCALC 72 07/14/2021 0953    Physical Exam:    VS:  BP 108/88 (BP Location: Left Arm, Patient Position: Sitting)  Pulse 86    Ht 5\' 7"  (1.702 m)    Wt 174 lb 9.6 oz (79.2 kg)    SpO2 95%    BMI 27.35 kg/m     Wt Readings from Last 3 Encounters:  01/19/22 174 lb 9.6 oz (79.2 kg)  07/14/21 173 lb 3.2 oz (78.6 kg)  01/26/21 177 lb 9.6 oz (80.6 kg)     GEN: Patient is in no acute distress HEENT: Normal NECK: No JVD; No carotid bruits LYMPHATICS: No lymphadenopathy CARDIAC: Hear sounds regular, 2/6 systolic murmur at the apex. RESPIRATORY:  Clear to auscultation without rales, wheezing or rhonchi  ABDOMEN: Soft, non-tender, non-distended MUSCULOSKELETAL:  No edema; No deformity  SKIN: Warm and dry NEUROLOGIC:  Alert and oriented x 3 PSYCHIATRIC:  Normal affect   Signed, [name], MD  01/19/2022 10:31 AM    [place]

## 2022-01-19 NOTE — Patient Instructions (Signed)
Medication Instructions:  Your physician recommends that you continue on your current medications as directed. Please refer to the Current Medication list given to you today.  *If you need a refill on your cardiac medications before your next appointment, please call your pharmacy*   Lab Work: Your physician recommends that you have labs done in the office today. Your test included  basic metabolic panel, complete blood count, TSH, vitamin D, A1C, liver function and lipids.  If you have labs (blood work) drawn today and your tests are completely normal, you will receive your results only by: [place] (if you have MyChart) OR A paper copy in the mail If you have any lab test that is abnormal or we need to change your treatment, we will call you to review the results.   Testing/Procedures: None ordered   Follow-Up: At [facility], you and your health needs are our priority.  As part of our continuing mission to provide you with exceptional heart care, we have created designated Provider Care Teams.  These Care Teams include your primary Cardiologist (physician) and Advanced Practice Providers (APPs -  Physician Assistants and Nurse Practitioners) who all work together to provide you with the care you need, when you need it.  We recommend signing up for the patient portal called "MyChart".  Sign up information is provided on this After Visit Summary.  MyChart is used to connect with patients for Virtual Visits (Telemedicine).  Patients are able to view lab/test results, encounter notes, upcoming appointments, etc.  Non-urgent messages can be sent to your provider as well.   To learn more about what you can do with MyChart, go to [email or web address].    Your next appointment:   6 month(s)  The format for your next appointment:   In Person  Provider:   Jyl Heinz, MD   Other Instructions NA

## 2022-01-20 LAB — CBC WITH DIFFERENTIAL/PLATELET
Basophils Absolute: 0 10*3/uL (ref 0.0–0.2)
Basos: 1 %
EOS (ABSOLUTE): 0.2 10*3/uL (ref 0.0–0.4)
Eos: 4 %
Hematocrit: 38 % (ref 34.0–46.6)
Hemoglobin: 12.6 g/dL (ref 11.1–15.9)
Immature Grans (Abs): 0 10*3/uL (ref 0.0–0.1)
Immature Granulocytes: 0 %
Lymphocytes Absolute: 1.3 10*3/uL (ref 0.7–3.1)
Lymphs: 33 %
MCH: 30.1 pg (ref 26.6–33.0)
MCHC: 33.2 g/dL (ref 31.5–35.7)
MCV: 91 fL (ref 79–97)
Monocytes Absolute: 0.4 10*3/uL (ref 0.1–0.9)
Monocytes: 10 %
Neutrophils Absolute: 2.1 10*3/uL (ref 1.4–7.0)
Neutrophils: 52 %
Platelets: 197 10*3/uL (ref 150–450)
RBC: 4.19 x10E6/uL (ref 3.77–5.28)
RDW: 12 % (ref 11.7–15.4)
WBC: 4 10*3/uL (ref 3.4–10.8)

## 2022-01-20 LAB — HEPATIC FUNCTION PANEL
ALT: 10 IU/L (ref 0–32)
AST: 18 IU/L (ref 0–40)
Albumin: 4.7 g/dL (ref 3.8–4.8)
Alkaline Phosphatase: 102 IU/L (ref 44–121)
Bilirubin Total: 0.5 mg/dL (ref 0.0–1.2)
Bilirubin, Direct: 0.14 mg/dL (ref 0.00–0.40)
Total Protein: 7.1 g/dL (ref 6.0–8.5)

## 2022-01-20 LAB — VITAMIN D 25 HYDROXY (VIT D DEFICIENCY, FRACTURES): Vit D, 25-Hydroxy: 30.6 ng/mL (ref 30.0–100.0)

## 2022-01-20 LAB — BASIC METABOLIC PANEL
BUN/Creatinine Ratio: 23 (ref 12–28)
BUN: 19 mg/dL (ref 8–27)
CO2: 23 mmol/L (ref 20–29)
Calcium: 9.5 mg/dL (ref 8.7–10.3)
Chloride: 105 mmol/L (ref 96–106)
Creatinine, Ser: 0.83 mg/dL (ref 0.57–1.00)
Glucose: 84 mg/dL (ref 70–99)
Potassium: 4.3 mmol/L (ref 3.5–5.2)
Sodium: 140 mmol/L (ref 134–144)
eGFR: 80 mL/min/{1.73_m2} (ref >59)

## 2022-01-20 LAB — LIPID PANEL
Chol/HDL Ratio: 2.5 ratio (ref 0.0–4.4)
Cholesterol, Total: 161 mg/dL (ref 100–199)
HDL: 64 mg/dL (ref >39)
LDL Chol Calc (NIH): 77 mg/dL (ref 0–99)
Triglycerides: 113 mg/dL (ref 0–149)
VLDL Cholesterol Cal: 20 mg/dL (ref 5–40)

## 2022-01-20 LAB — TSH: TSH: 1.68 u[IU]/mL (ref 0.450–4.500)

## 2022-01-20 LAB — HEMOGLOBIN A1C
Est. average glucose Bld gHb Est-mCnc: 114 mg/dL
Hgb A1c MFr Bld: 5.6 % (ref 4.8–5.6)

## 2022-02-06 ENCOUNTER — Other Ambulatory Visit: Payer: Self-pay | Admitting: Cardiology

## 2022-02-07 NOTE — Telephone Encounter (Signed)
Farxiga 5 mg # 60 x 3 refill sent to   [place], [place]

## 2022-02-09 DIAGNOSIS — M9904 Segmental and somatic dysfunction of sacral region: Secondary | ICD-10-CM | POA: Diagnosis not present

## 2022-02-09 DIAGNOSIS — M5136 Other intervertebral disc degeneration, lumbar region: Secondary | ICD-10-CM | POA: Diagnosis not present

## 2022-02-09 DIAGNOSIS — M5137 Other intervertebral disc degeneration, lumbosacral region: Secondary | ICD-10-CM | POA: Diagnosis not present

## 2022-02-09 DIAGNOSIS — M5441 Lumbago with sciatica, right side: Secondary | ICD-10-CM | POA: Diagnosis not present

## 2022-02-09 DIAGNOSIS — M609 Myositis, unspecified: Secondary | ICD-10-CM | POA: Diagnosis not present

## 2022-02-09 DIAGNOSIS — M9903 Segmental and somatic dysfunction of lumbar region: Secondary | ICD-10-CM | POA: Diagnosis not present

## 2022-02-20 DIAGNOSIS — M9903 Segmental and somatic dysfunction of lumbar region: Secondary | ICD-10-CM | POA: Diagnosis not present

## 2022-02-20 DIAGNOSIS — M5136 Other intervertebral disc degeneration, lumbar region: Secondary | ICD-10-CM | POA: Diagnosis not present

## 2022-02-20 DIAGNOSIS — M5137 Other intervertebral disc degeneration, lumbosacral region: Secondary | ICD-10-CM | POA: Diagnosis not present

## 2022-02-20 DIAGNOSIS — M609 Myositis, unspecified: Secondary | ICD-10-CM | POA: Diagnosis not present

## 2022-02-20 DIAGNOSIS — M9904 Segmental and somatic dysfunction of sacral region: Secondary | ICD-10-CM | POA: Diagnosis not present

## 2022-02-20 DIAGNOSIS — M5441 Lumbago with sciatica, right side: Secondary | ICD-10-CM | POA: Diagnosis not present

## 2022-02-23 DIAGNOSIS — M5136 Other intervertebral disc degeneration, lumbar region: Secondary | ICD-10-CM | POA: Diagnosis not present

## 2022-02-23 DIAGNOSIS — M5137 Other intervertebral disc degeneration, lumbosacral region: Secondary | ICD-10-CM | POA: Diagnosis not present

## 2022-02-23 DIAGNOSIS — M9903 Segmental and somatic dysfunction of lumbar region: Secondary | ICD-10-CM | POA: Diagnosis not present

## 2022-02-23 DIAGNOSIS — M609 Myositis, unspecified: Secondary | ICD-10-CM | POA: Diagnosis not present

## 2022-02-23 DIAGNOSIS — M9904 Segmental and somatic dysfunction of sacral region: Secondary | ICD-10-CM | POA: Diagnosis not present

## 2022-02-23 DIAGNOSIS — M5441 Lumbago with sciatica, right side: Secondary | ICD-10-CM | POA: Diagnosis not present

## 2022-03-02 DIAGNOSIS — M9904 Segmental and somatic dysfunction of sacral region: Secondary | ICD-10-CM | POA: Diagnosis not present

## 2022-03-02 DIAGNOSIS — M5137 Other intervertebral disc degeneration, lumbosacral region: Secondary | ICD-10-CM | POA: Diagnosis not present

## 2022-03-02 DIAGNOSIS — M5441 Lumbago with sciatica, right side: Secondary | ICD-10-CM | POA: Diagnosis not present

## 2022-03-02 DIAGNOSIS — M5136 Other intervertebral disc degeneration, lumbar region: Secondary | ICD-10-CM | POA: Diagnosis not present

## 2022-03-02 DIAGNOSIS — M609 Myositis, unspecified: Secondary | ICD-10-CM | POA: Diagnosis not present

## 2022-03-02 DIAGNOSIS — M9903 Segmental and somatic dysfunction of lumbar region: Secondary | ICD-10-CM | POA: Diagnosis not present

## 2022-03-06 DIAGNOSIS — M9904 Segmental and somatic dysfunction of sacral region: Secondary | ICD-10-CM | POA: Diagnosis not present

## 2022-03-06 DIAGNOSIS — M5136 Other intervertebral disc degeneration, lumbar region: Secondary | ICD-10-CM | POA: Diagnosis not present

## 2022-03-06 DIAGNOSIS — M5137 Other intervertebral disc degeneration, lumbosacral region: Secondary | ICD-10-CM | POA: Diagnosis not present

## 2022-03-06 DIAGNOSIS — M5441 Lumbago with sciatica, right side: Secondary | ICD-10-CM | POA: Diagnosis not present

## 2022-03-06 DIAGNOSIS — M9903 Segmental and somatic dysfunction of lumbar region: Secondary | ICD-10-CM | POA: Diagnosis not present

## 2022-03-06 DIAGNOSIS — M609 Myositis, unspecified: Secondary | ICD-10-CM | POA: Diagnosis not present

## 2022-03-09 DIAGNOSIS — M609 Myositis, unspecified: Secondary | ICD-10-CM | POA: Diagnosis not present

## 2022-03-09 DIAGNOSIS — M9904 Segmental and somatic dysfunction of sacral region: Secondary | ICD-10-CM | POA: Diagnosis not present

## 2022-03-09 DIAGNOSIS — M5136 Other intervertebral disc degeneration, lumbar region: Secondary | ICD-10-CM | POA: Diagnosis not present

## 2022-03-09 DIAGNOSIS — M5441 Lumbago with sciatica, right side: Secondary | ICD-10-CM | POA: Diagnosis not present

## 2022-03-09 DIAGNOSIS — M5137 Other intervertebral disc degeneration, lumbosacral region: Secondary | ICD-10-CM | POA: Diagnosis not present

## 2022-03-09 DIAGNOSIS — M9903 Segmental and somatic dysfunction of lumbar region: Secondary | ICD-10-CM | POA: Diagnosis not present

## 2022-03-13 DIAGNOSIS — M9903 Segmental and somatic dysfunction of lumbar region: Secondary | ICD-10-CM | POA: Diagnosis not present

## 2022-03-13 DIAGNOSIS — M5441 Lumbago with sciatica, right side: Secondary | ICD-10-CM | POA: Diagnosis not present

## 2022-03-13 DIAGNOSIS — M609 Myositis, unspecified: Secondary | ICD-10-CM | POA: Diagnosis not present

## 2022-03-13 DIAGNOSIS — M9904 Segmental and somatic dysfunction of sacral region: Secondary | ICD-10-CM | POA: Diagnosis not present

## 2022-03-13 DIAGNOSIS — M5137 Other intervertebral disc degeneration, lumbosacral region: Secondary | ICD-10-CM | POA: Diagnosis not present

## 2022-03-13 DIAGNOSIS — M5136 Other intervertebral disc degeneration, lumbar region: Secondary | ICD-10-CM | POA: Diagnosis not present

## 2022-03-19 ENCOUNTER — Other Ambulatory Visit: Payer: Self-pay | Admitting: Cardiology

## 2022-03-22 DIAGNOSIS — M609 Myositis, unspecified: Secondary | ICD-10-CM | POA: Diagnosis not present

## 2022-03-22 DIAGNOSIS — M5441 Lumbago with sciatica, right side: Secondary | ICD-10-CM | POA: Diagnosis not present

## 2022-03-22 DIAGNOSIS — M9904 Segmental and somatic dysfunction of sacral region: Secondary | ICD-10-CM | POA: Diagnosis not present

## 2022-03-22 DIAGNOSIS — M5136 Other intervertebral disc degeneration, lumbar region: Secondary | ICD-10-CM | POA: Diagnosis not present

## 2022-03-22 DIAGNOSIS — M9903 Segmental and somatic dysfunction of lumbar region: Secondary | ICD-10-CM | POA: Diagnosis not present

## 2022-03-22 DIAGNOSIS — M5137 Other intervertebral disc degeneration, lumbosacral region: Secondary | ICD-10-CM | POA: Diagnosis not present

## 2022-03-29 DIAGNOSIS — M5137 Other intervertebral disc degeneration, lumbosacral region: Secondary | ICD-10-CM | POA: Diagnosis not present

## 2022-03-29 DIAGNOSIS — M9904 Segmental and somatic dysfunction of sacral region: Secondary | ICD-10-CM | POA: Diagnosis not present

## 2022-03-29 DIAGNOSIS — M5441 Lumbago with sciatica, right side: Secondary | ICD-10-CM | POA: Diagnosis not present

## 2022-03-29 DIAGNOSIS — M9903 Segmental and somatic dysfunction of lumbar region: Secondary | ICD-10-CM | POA: Diagnosis not present

## 2022-03-29 DIAGNOSIS — M5136 Other intervertebral disc degeneration, lumbar region: Secondary | ICD-10-CM | POA: Diagnosis not present

## 2022-03-29 DIAGNOSIS — M609 Myositis, unspecified: Secondary | ICD-10-CM | POA: Diagnosis not present

## 2022-04-10 DIAGNOSIS — M609 Myositis, unspecified: Secondary | ICD-10-CM | POA: Diagnosis not present

## 2022-04-10 DIAGNOSIS — M9903 Segmental and somatic dysfunction of lumbar region: Secondary | ICD-10-CM | POA: Diagnosis not present

## 2022-04-10 DIAGNOSIS — M5136 Other intervertebral disc degeneration, lumbar region: Secondary | ICD-10-CM | POA: Diagnosis not present

## 2022-04-10 DIAGNOSIS — M9904 Segmental and somatic dysfunction of sacral region: Secondary | ICD-10-CM | POA: Diagnosis not present

## 2022-04-10 DIAGNOSIS — M5137 Other intervertebral disc degeneration, lumbosacral region: Secondary | ICD-10-CM | POA: Diagnosis not present

## 2022-04-10 DIAGNOSIS — M5441 Lumbago with sciatica, right side: Secondary | ICD-10-CM | POA: Diagnosis not present

## 2022-04-14 ENCOUNTER — Other Ambulatory Visit: Payer: Self-pay | Admitting: Cardiology

## 2022-04-14 DIAGNOSIS — M5441 Lumbago with sciatica, right side: Secondary | ICD-10-CM | POA: Diagnosis not present

## 2022-04-14 DIAGNOSIS — M5136 Other intervertebral disc degeneration, lumbar region: Secondary | ICD-10-CM | POA: Diagnosis not present

## 2022-04-14 DIAGNOSIS — M5137 Other intervertebral disc degeneration, lumbosacral region: Secondary | ICD-10-CM | POA: Diagnosis not present

## 2022-04-14 DIAGNOSIS — M9904 Segmental and somatic dysfunction of sacral region: Secondary | ICD-10-CM | POA: Diagnosis not present

## 2022-04-14 DIAGNOSIS — M609 Myositis, unspecified: Secondary | ICD-10-CM | POA: Diagnosis not present

## 2022-04-14 DIAGNOSIS — M9903 Segmental and somatic dysfunction of lumbar region: Secondary | ICD-10-CM | POA: Diagnosis not present

## 2022-04-21 DIAGNOSIS — M5137 Other intervertebral disc degeneration, lumbosacral region: Secondary | ICD-10-CM | POA: Diagnosis not present

## 2022-04-21 DIAGNOSIS — M9904 Segmental and somatic dysfunction of sacral region: Secondary | ICD-10-CM | POA: Diagnosis not present

## 2022-04-21 DIAGNOSIS — M5136 Other intervertebral disc degeneration, lumbar region: Secondary | ICD-10-CM | POA: Diagnosis not present

## 2022-04-21 DIAGNOSIS — M5441 Lumbago with sciatica, right side: Secondary | ICD-10-CM | POA: Diagnosis not present

## 2022-04-21 DIAGNOSIS — M9903 Segmental and somatic dysfunction of lumbar region: Secondary | ICD-10-CM | POA: Diagnosis not present

## 2022-04-21 DIAGNOSIS — M609 Myositis, unspecified: Secondary | ICD-10-CM | POA: Diagnosis not present

## 2022-04-27 DIAGNOSIS — M609 Myositis, unspecified: Secondary | ICD-10-CM | POA: Diagnosis not present

## 2022-04-27 DIAGNOSIS — M9904 Segmental and somatic dysfunction of sacral region: Secondary | ICD-10-CM | POA: Diagnosis not present

## 2022-04-27 DIAGNOSIS — M5441 Lumbago with sciatica, right side: Secondary | ICD-10-CM | POA: Diagnosis not present

## 2022-04-27 DIAGNOSIS — M5137 Other intervertebral disc degeneration, lumbosacral region: Secondary | ICD-10-CM | POA: Diagnosis not present

## 2022-04-27 DIAGNOSIS — M5136 Other intervertebral disc degeneration, lumbar region: Secondary | ICD-10-CM | POA: Diagnosis not present

## 2022-04-27 DIAGNOSIS — M9903 Segmental and somatic dysfunction of lumbar region: Secondary | ICD-10-CM | POA: Diagnosis not present

## 2022-05-12 DIAGNOSIS — M9904 Segmental and somatic dysfunction of sacral region: Secondary | ICD-10-CM | POA: Diagnosis not present

## 2022-05-12 DIAGNOSIS — M5137 Other intervertebral disc degeneration, lumbosacral region: Secondary | ICD-10-CM | POA: Diagnosis not present

## 2022-05-12 DIAGNOSIS — M5136 Other intervertebral disc degeneration, lumbar region: Secondary | ICD-10-CM | POA: Diagnosis not present

## 2022-05-12 DIAGNOSIS — M9903 Segmental and somatic dysfunction of lumbar region: Secondary | ICD-10-CM | POA: Diagnosis not present

## 2022-05-12 DIAGNOSIS — M5441 Lumbago with sciatica, right side: Secondary | ICD-10-CM | POA: Diagnosis not present

## 2022-05-12 DIAGNOSIS — M609 Myositis, unspecified: Secondary | ICD-10-CM | POA: Diagnosis not present

## 2022-05-25 DIAGNOSIS — D6869 Other thrombophilia: Secondary | ICD-10-CM | POA: Diagnosis not present

## 2022-05-25 DIAGNOSIS — Z7901 Long term (current) use of anticoagulants: Secondary | ICD-10-CM | POA: Diagnosis not present

## 2022-05-25 DIAGNOSIS — Z008 Encounter for other general examination: Secondary | ICD-10-CM | POA: Diagnosis not present

## 2022-05-25 DIAGNOSIS — I4891 Unspecified atrial fibrillation: Secondary | ICD-10-CM | POA: Diagnosis not present

## 2022-05-25 DIAGNOSIS — Z7982 Long term (current) use of aspirin: Secondary | ICD-10-CM | POA: Diagnosis not present

## 2022-05-25 DIAGNOSIS — E785 Hyperlipidemia, unspecified: Secondary | ICD-10-CM | POA: Diagnosis not present

## 2022-05-25 DIAGNOSIS — I110 Hypertensive heart disease with heart failure: Secondary | ICD-10-CM | POA: Diagnosis not present

## 2022-05-25 DIAGNOSIS — Z7984 Long term (current) use of oral hypoglycemic drugs: Secondary | ICD-10-CM | POA: Diagnosis not present

## 2022-05-25 DIAGNOSIS — Z8673 Personal history of transient ischemic attack (TIA), and cerebral infarction without residual deficits: Secondary | ICD-10-CM | POA: Diagnosis not present

## 2022-05-25 DIAGNOSIS — I509 Heart failure, unspecified: Secondary | ICD-10-CM | POA: Diagnosis not present

## 2022-05-25 DIAGNOSIS — R32 Unspecified urinary incontinence: Secondary | ICD-10-CM | POA: Diagnosis not present

## 2022-05-26 DIAGNOSIS — M9903 Segmental and somatic dysfunction of lumbar region: Secondary | ICD-10-CM | POA: Diagnosis not present

## 2022-05-26 DIAGNOSIS — M9904 Segmental and somatic dysfunction of sacral region: Secondary | ICD-10-CM | POA: Diagnosis not present

## 2022-05-26 DIAGNOSIS — M5136 Other intervertebral disc degeneration, lumbar region: Secondary | ICD-10-CM | POA: Diagnosis not present

## 2022-05-26 DIAGNOSIS — M5441 Lumbago with sciatica, right side: Secondary | ICD-10-CM | POA: Diagnosis not present

## 2022-05-26 DIAGNOSIS — M5137 Other intervertebral disc degeneration, lumbosacral region: Secondary | ICD-10-CM | POA: Diagnosis not present

## 2022-05-26 DIAGNOSIS — M609 Myositis, unspecified: Secondary | ICD-10-CM | POA: Diagnosis not present

## 2022-06-16 DIAGNOSIS — M5136 Other intervertebral disc degeneration, lumbar region: Secondary | ICD-10-CM | POA: Diagnosis not present

## 2022-06-16 DIAGNOSIS — M9904 Segmental and somatic dysfunction of sacral region: Secondary | ICD-10-CM | POA: Diagnosis not present

## 2022-06-16 DIAGNOSIS — M5441 Lumbago with sciatica, right side: Secondary | ICD-10-CM | POA: Diagnosis not present

## 2022-06-16 DIAGNOSIS — M609 Myositis, unspecified: Secondary | ICD-10-CM | POA: Diagnosis not present

## 2022-06-16 DIAGNOSIS — M9903 Segmental and somatic dysfunction of lumbar region: Secondary | ICD-10-CM | POA: Diagnosis not present

## 2022-06-16 DIAGNOSIS — M5137 Other intervertebral disc degeneration, lumbosacral region: Secondary | ICD-10-CM | POA: Diagnosis not present

## 2022-06-22 ENCOUNTER — Other Ambulatory Visit: Payer: Self-pay | Admitting: Cardiology

## 2022-07-19 ENCOUNTER — Ambulatory Visit: Payer: Medicare HMO | Admitting: Cardiology

## 2022-08-01 ENCOUNTER — Ambulatory Visit: Payer: Medicare HMO | Admitting: Cardiology

## 2022-08-01 ENCOUNTER — Encounter: Payer: Self-pay | Admitting: Cardiology

## 2022-08-01 VITALS — BP 98/62 | HR 82 | Ht 68.0 in | Wt 168.8 lb

## 2022-08-01 DIAGNOSIS — Z9889 Other specified postprocedural states: Secondary | ICD-10-CM | POA: Diagnosis not present

## 2022-08-01 DIAGNOSIS — I10 Essential (primary) hypertension: Secondary | ICD-10-CM

## 2022-08-01 DIAGNOSIS — I351 Nonrheumatic aortic (valve) insufficiency: Secondary | ICD-10-CM | POA: Diagnosis not present

## 2022-08-01 DIAGNOSIS — Z8673 Personal history of transient ischemic attack (TIA), and cerebral infarction without residual deficits: Secondary | ICD-10-CM

## 2022-08-01 DIAGNOSIS — I255 Ischemic cardiomyopathy: Secondary | ICD-10-CM | POA: Diagnosis not present

## 2022-08-01 DIAGNOSIS — Z8679 Personal history of other diseases of the circulatory system: Secondary | ICD-10-CM

## 2022-08-01 NOTE — Patient Instructions (Signed)
Medication Instructions:  Your physician recommends that you continue on your current medications as directed. Please refer to the Current Medication list given to you today.  *If you need a refill on your cardiac medications before your next appointment, please call your pharmacy*   Lab Work: Your physician recommends that you return for lab work in: Have labs done on the day of your echo. Labs to be done: Basic Metabolic Panel, CBC, TSH, Liver Functions, Fasting Lipid Panel, A1C and Vit d level  If you have labs (blood work) drawn today and your tests are completely normal, you will receive your results only by: MyChart Message (if you have MyChart) OR A paper copy in the mail If you have any lab test that is abnormal or we need to change your treatment, we will call you to review the results. Lab opens at [place] DO NOT NEED an appointment. Best time to come is between 8am and 12noon and between 1:30 and 4:30. If you have been asked to fast for your blood work please have nothing to eat or drink after midnight. You may have water.   Testing/Procedures: Your physician has requested that you have an echocardiogram. Echocardiography is a painless test that uses sound waves to create images of your heart. It provides your doctor with information about the size and shape of your heart and how well your heart's chambers and valves are working. This procedure takes approximately one hour. There are no restrictions for this procedure.    Follow-Up: At [facility], you and your health needs are our priority.  As part of our continuing mission to provide you with exceptional heart care, we have created designated Provider Care Teams.  These Care Teams include your primary Cardiologist (physician) and Advanced Practice Providers (APPs -  Physician Assistants and Nurse Practitioners) who all work together to provide you with the care you need, when you need it.  We recommend signing up for the  patient portal called "MyChart".  Sign up information is provided on this After Visit Summary.  MyChart is used to connect with patients for Virtual Visits (Telemedicine).  Patients are able to view lab/test results, encounter notes, upcoming appointments, etc.  Non-urgent messages can be sent to your provider as well.   To learn more about what you can do with MyChart, go to [email or web address].    Your next appointment:   9 month(s)  The format for your next appointment:   In Person  Provider:   Jyl Heinz, MD    Other Instructions   Important Information About Sugar

## 2022-08-01 NOTE — Addendum Note (Signed)
Addended by: Anselm Pancoast on: 08/01/2022 03:37 PM   Modules accepted: Orders

## 2022-08-01 NOTE — Progress Notes (Signed)
Cardiology Office Note:    Date:  08/01/2022   ID:  Stacy Jenkins, DOB 24-Jan-1961, MRN [id number]  PCP:  [name], MD  Cardiologist:  [name], MD   Referring MD: [name], MD    ASSESSMENT:    1. Aortic valve insufficiency, etiology of cardiac valve disease unspecified   2. Ischemic cardiomyopathy   3. Benign essential hypertension   4. History of atrial fibrillation   5. History of CVA (cerebrovascular accident)   6. S/P ascending aortic aneurysm repair    PLAN:    In order of problems listed above:  Primary prevention stressed to the patient.  Importance of compliance with diet medication stressed and she vocalized understanding. Cardiomyopathy: On guideline directed medical therapy.  We will do an echocardiogram to follow-up ejection fraction. Essential hypertension: Blood pressure stable.  Diet was emphasized.  Blood pressure is borderline but she is asymptomatic and in view of cardiomyopathy we will continue current medications. Paroxysmal atrial fibrillation:I discussed with the patient atrial fibrillation, disease process. Management and therapy including rate and rhythm control, anticoagulation benefits and potential risks were discussed extensively with the patient. Patient had multiple questions which were answered to patient's satisfaction. Post ascending aortic aneurysm repair, aortic regurgitation: Stable at this time and we will continue to monitor.  She will come back for an echo in the next few days. She will come back for complete blood in the next few days including vitamin D because of history of deficiency. Patient will be seen in follow-up appointment in 6 months or earlier if the patient has any concerns    Medication Adjustments/Labs and Tests Ordered: Current medicines are reviewed at length with the patient today.  Concerns regarding medicines are outlined above.  No orders of the defined types were placed in this  encounter.  No orders of the defined types were placed in this encounter.    Chief Complaint  Patient presents with   Follow-up     History of Present Illness:    Stacy Jenkins is a 61 y.o. female.  Patient has past medical history of aortic aneurysm postrepair, atrial fibrillation, cardiomyopathy, essential hypertension and dyslipidemia.  She denies any problems at this time and takes care of activities of daily living.  No chest pain orthopnea or PND.  At the time of my evaluation, the patient is alert awake oriented and in no distress.  Past Medical History:  Diagnosis Date   Abnormal echocardiogram 08/20/2018   Added automatically from request for surgery [id number]   Acute blood loss anemia 11/19/2018   Acute hyperglycemia 03/10/2016   Acute post-operative pain 11/19/2018   Aortic valve regurgitation 08/20/2018   Formatting of this note might be different from the original. Added automatically from request for surgery [id number]   Aphasia    Ascending aortic aneurysm ([place])    Benign essential hypertension 08/09/2017   at goal on current meds  Formatting of this note might be different from the original. at goal on current meds   Cardiomyopathy ([place]) 01/29/2018   Dilated aortic root ([place]) 09/11/2012   Formatting of this note might be different from the original. 4.6 cm   Elevated blood-pressure reading without diagnosis of hypertension 01/29/2018   will monitor  Formatting of this note might be different from the original. will monitor   Expressive aphasia 11/20/2018   Hiatal hernia 01/29/2018   History of atrial fibrillation 11/20/2018   History of CVA (cerebrovascular accident) 08/09/2017   History of migraine  01/29/2018   Hyperlipidemia 01/08/2019   Hypertension 01/08/2019   Hypoglycemia 01/29/2018   dietary info  Formatting of this note might be different from the original. dietary info   Medicare annual wellness visit, subsequent 02/17/2019   Mixed dyslipidemia  08/09/2017   Nonrheumatic aortic valve insufficiency    Other abnormal glucose 01/29/2018   PFO (patent foramen ovale) 09/11/2012   Postoperative atrial fibrillation ([place]) 11/20/2018   S/P ascending aortic aneurysm repair 11/20/2018   S/P AVR 11/19/2018   Stroke (cerebrum) ([place]) - Left MCA branchStroke in a patient with history of prior strokes and mild residual deficits, s/p tPA 07/13/2017   Stroke ([place])    Vitamin D deficiency 01/29/2018    Past Surgical History:  Procedure Laterality Date   aortic valve surgery  02/2016   TEE WITHOUT CARDIOVERSION N/A 07/16/2017   Procedure: TRANSESOPHAGEAL ECHOCARDIOGRAM (TEE);  Surgeon: Sanda Klein, MD;  Location: [facility];  Service: Cardiovascular;  Laterality: N/A;    Current Medications: Current Meds  Medication Sig   apixaban (ELIQUIS) 5 MG TABS tablet Take 1 tablet (5 mg total) by mouth 2 (two) times daily.   aspirin EC 81 MG tablet Take 81 mg by mouth daily.   Coenzyme Q-10 200 MG CAPS Take 1 capsule by mouth daily.   dapagliflozin propanediol (FARXIGA) 5 MG TABS tablet Take 1 tablet (5 mg total) by mouth daily before breakfast.   ENTRESTO 24-26 MG Take 1 tablet by mouth twice daily (Patient taking differently: Take 1 tablet by mouth 2 (two) times daily.)   metoprolol succinate (TOPROL-XL) 50 MG 24 hr tablet Take 50 mg by mouth daily.   Multiple Vitamin (MULTIVITAMIN) capsule Take 1 capsule by mouth daily. Unknown strenght   rosuvastatin (CRESTOR) 5 MG tablet Take 1 tablet by mouth once daily (Patient taking differently: Take 5 mg by mouth daily.)     Allergies:   Penicillin g   Social History   Socioeconomic History   Marital status: Married    Spouse name: Not on file   Number of children: Not on file   Years of education: Not on file   Highest education level: Not on file  Occupational History   Not on file  Tobacco Use   Smoking status: Never   Smokeless tobacco: Never  Vaping Use   Vaping Use: Never used   Substance and Sexual Activity   Alcohol use: No   Drug use: No   Sexual activity: Not on file  Other Topics Concern   Not on file  Social History Narrative   Not on file   Social Determinants of Health   Financial Resource Strain: Not on file  Food Insecurity: Not on file  Transportation Needs: Not on file  Physical Activity: Not on file  Stress: Not on file  Social Connections: Not on file     Family History: The patient's family history includes Heart attack in her father; Heart disease in her brother and father; Hypertension in her mother.  ROS:   Please see the history of present illness.    All other systems reviewed and are negative.  EKGs/Labs/Other Studies Reviewed:    The following studies were reviewed today: EKG reveals sinus rhythm and nonspecific ST-T changes   Recent Labs: 01/19/2022: ALT 10; BUN 19; Creatinine, Ser 0.83; Hemoglobin 12.6; Platelets 197; Potassium 4.3; Sodium 140; TSH 1.680  Recent Lipid Panel    Component Value Date/Time   CHOL 161 01/19/2022 1055   TRIG 113 01/19/2022 1055  HDL 64 01/19/2022 1055   CHOLHDL 2.5 01/19/2022 1055   CHOLHDL 2.7 07/14/2017 0241   VLDL 19 07/14/2017 0241   LDLCALC 77 01/19/2022 1055    Physical Exam:    VS:  BP 98/62 (BP Location: Left Arm, Patient Position: Sitting)   Pulse 82   Ht '5\' 8"'$  (1.727 m)   Wt 168 lb 12.8 oz (76.6 kg)   SpO2 98%   BMI 25.67 kg/m     Wt Readings from Last 3 Encounters:  08/01/22 168 lb 12.8 oz (76.6 kg)  01/19/22 174 lb 9.6 oz (79.2 kg)  07/14/21 173 lb 3.2 oz (78.6 kg)     GEN: Patient is in no acute distress HEENT: Normal NECK: No JVD; No carotid bruits LYMPHATICS: No lymphadenopathy CARDIAC: Hear sounds regular, 2/6 systolic murmur at the apex. RESPIRATORY:  Clear to auscultation without rales, wheezing or rhonchi  ABDOMEN: Soft, non-tender, non-distended MUSCULOSKELETAL:  No edema; No deformity  SKIN: Warm and dry NEUROLOGIC:  Alert and oriented x  3 PSYCHIATRIC:  Normal affect   Signed, [name], MD  08/01/2022 3:20 PM    [facility]

## 2022-08-04 ENCOUNTER — Ambulatory Visit (INDEPENDENT_AMBULATORY_CARE_PROVIDER_SITE_OTHER): Payer: Medicare HMO

## 2022-08-04 DIAGNOSIS — R7309 Other abnormal glucose: Secondary | ICD-10-CM | POA: Diagnosis not present

## 2022-08-04 DIAGNOSIS — Z8679 Personal history of other diseases of the circulatory system: Secondary | ICD-10-CM | POA: Diagnosis not present

## 2022-08-04 DIAGNOSIS — I351 Nonrheumatic aortic (valve) insufficiency: Secondary | ICD-10-CM | POA: Diagnosis not present

## 2022-08-04 DIAGNOSIS — Z8673 Personal history of transient ischemic attack (TIA), and cerebral infarction without residual deficits: Secondary | ICD-10-CM | POA: Diagnosis not present

## 2022-08-04 DIAGNOSIS — I10 Essential (primary) hypertension: Secondary | ICD-10-CM

## 2022-08-04 DIAGNOSIS — Z9889 Other specified postprocedural states: Secondary | ICD-10-CM

## 2022-08-04 DIAGNOSIS — I255 Ischemic cardiomyopathy: Secondary | ICD-10-CM | POA: Diagnosis not present

## 2022-08-04 DIAGNOSIS — E559 Vitamin D deficiency, unspecified: Secondary | ICD-10-CM | POA: Diagnosis not present

## 2022-08-04 LAB — ECHOCARDIOGRAM COMPLETE
AR max vel: 0.88 cm2
AV Area VTI: 0.83 cm2
AV Area mean vel: 0.82 cm2
AV Mean grad: 14.5 mmHg
AV Peak grad: 23.1 mmHg
Ao pk vel: 2.41 m/s
Area-P 1/2: 3.27 cm2
MV M vel: 5.12 m/s
MV Peak grad: 104.7 mmHg
S' Lateral: 4.6 cm

## 2022-08-05 LAB — CBC WITH DIFFERENTIAL/PLATELET
Basophils Absolute: 0 10*3/uL (ref 0.0–0.2)
Basos: 1 %
EOS (ABSOLUTE): 0.2 10*3/uL (ref 0.0–0.4)
Eos: 4 %
Hematocrit: 34.2 % (ref 34.0–46.6)
Hemoglobin: 10.9 g/dL — ABNORMAL LOW (ref 11.1–15.9)
Immature Grans (Abs): 0 10*3/uL (ref 0.0–0.1)
Immature Granulocytes: 0 %
Lymphocytes Absolute: 1.6 10*3/uL (ref 0.7–3.1)
Lymphs: 41 %
MCH: 27.1 pg (ref 26.6–33.0)
MCHC: 31.9 g/dL (ref 31.5–35.7)
MCV: 85 fL (ref 79–97)
Monocytes Absolute: 0.4 10*3/uL (ref 0.1–0.9)
Monocytes: 9 %
Neutrophils Absolute: 1.7 10*3/uL (ref 1.4–7.0)
Neutrophils: 45 %
Platelets: 216 10*3/uL (ref 150–450)
RBC: 4.02 x10E6/uL (ref 3.77–5.28)
RDW: 14.4 % (ref 11.7–15.4)
WBC: 3.8 10*3/uL (ref 3.4–10.8)

## 2022-08-05 LAB — LIPID PANEL
Chol/HDL Ratio: 2.5 ratio (ref 0.0–4.4)
Cholesterol, Total: 167 mg/dL (ref 100–199)
HDL: 68 mg/dL (ref >39)
LDL Chol Calc (NIH): 82 mg/dL (ref 0–99)
Triglycerides: 93 mg/dL (ref 0–149)
VLDL Cholesterol Cal: 17 mg/dL (ref 5–40)

## 2022-08-05 LAB — BASIC METABOLIC PANEL
BUN/Creatinine Ratio: 30 — ABNORMAL HIGH (ref 12–28)
BUN: 24 mg/dL (ref 8–27)
CO2: 23 mmol/L (ref 20–29)
Calcium: 9.2 mg/dL (ref 8.7–10.3)
Chloride: 105 mmol/L (ref 96–106)
Creatinine, Ser: 0.8 mg/dL (ref 0.57–1.00)
Glucose: 85 mg/dL (ref 70–99)
Potassium: 4.1 mmol/L (ref 3.5–5.2)
Sodium: 140 mmol/L (ref 134–144)
eGFR: 83 mL/min/{1.73_m2} (ref >59)

## 2022-08-05 LAB — HEMOGLOBIN A1C
Est. average glucose Bld gHb Est-mCnc: 117 mg/dL
Hgb A1c MFr Bld: 5.7 % — ABNORMAL HIGH (ref 4.8–5.6)

## 2022-08-05 LAB — HEPATIC FUNCTION PANEL
ALT: 9 IU/L (ref 0–32)
AST: 15 IU/L (ref 0–40)
Albumin: 4.4 g/dL (ref 3.9–4.9)
Alkaline Phosphatase: 78 IU/L (ref 44–121)
Bilirubin Total: 0.4 mg/dL (ref 0.0–1.2)
Bilirubin, Direct: 0.15 mg/dL (ref 0.00–0.40)
Total Protein: 7.1 g/dL (ref 6.0–8.5)

## 2022-08-05 LAB — VITAMIN D 25 HYDROXY (VIT D DEFICIENCY, FRACTURES): Vit D, 25-Hydroxy: 37 ng/mL (ref 30.0–100.0)

## 2022-08-05 LAB — TSH: TSH: 2.98 u[IU]/mL (ref 0.450–4.500)

## 2022-08-17 ENCOUNTER — Telehealth: Payer: Self-pay | Admitting: Cardiology

## 2022-08-17 NOTE — Telephone Encounter (Signed)
Results reviewed with Herbie Baltimore per DPR as per Dr. Julien Nordmann note.  Herbie Baltimore verbalized understanding and had no additional questions. Routed to PCP.

## 2022-08-17 NOTE — Telephone Encounter (Signed)
  Pt's spouse returning call to get results

## 2022-10-02 ENCOUNTER — Other Ambulatory Visit: Payer: Self-pay | Admitting: Cardiology

## 2022-10-03 ENCOUNTER — Other Ambulatory Visit: Payer: Self-pay

## 2022-10-03 ENCOUNTER — Telehealth: Payer: Self-pay | Admitting: *Deleted

## 2022-10-03 MED ORDER — ENTRESTO 24-26 MG PO TABS: 1.0000 | ORAL_TABLET | Freq: Two times a day (BID) | ORAL | 3 refills | Status: DC

## 2022-10-03 NOTE — Patient Outreach (Signed)
  Care Coordination   10/03/2022 Name: Stacy Jenkins MRN: [id number] DOB: [birth year]   Care Coordination Outreach Attempts:  An unsuccessful telephone outreach was attempted today to offer the patient information about available care coordination services as a benefit of their health plan.   Follow Up Plan:  Additional outreach attempts will be made to offer the patient care coordination information and services.   Encounter Outcome:  No Answer  Care Coordination Interventions Activated:  No   Care Coordination Interventions:  No, not indicated    Jacqlyn Larsen [facility], Lead RN Care Coordinator [phone]

## 2022-10-05 ENCOUNTER — Telehealth: Payer: Self-pay | Admitting: *Deleted

## 2022-10-05 NOTE — Patient Outreach (Signed)
  Care Coordination   Initial Visit Note   10/05/2022 Name: Stacy Jenkins MRN: [id number] DOB: 1961/12/14  Stacy Jenkins is a 61 y.o. year old female who sees [name], MD for primary care. I spoke with  [name], [name]' husband by phone today.  What matters to the patients health and wellness today?  Being safe and having a good quality of life. Couple has a son that is a PA and is employed nearby. They have frequent contact with him and he is available to assist and assess.   Goals Addressed   None     SDOH assessments and interventions completed:  Yes  SDOH Interventions Today    Flowsheet Row Most Recent Value  SDOH Interventions   Food Insecurity Interventions Intervention Not Indicated  Housing Interventions Intervention Not Indicated  Transportation Interventions Intervention Not Indicated  Utilities Interventions Intervention Not Indicated        Care Coordination Interventions Activated:  No  Care Coordination Interventions:  No, not indicated   Follow up plan: No further intervention required.   Encounter Outcome:  Pt. Refused   Finn Altemose C. Myrtie Neither, MSN, [facility] Gerontological Nurse Practitioner [facility] Care Management [phone]

## 2022-10-16 DIAGNOSIS — Z0100 Encounter for examination of eyes and vision without abnormal findings: Secondary | ICD-10-CM | POA: Diagnosis not present

## 2022-10-16 DIAGNOSIS — H524 Presbyopia: Secondary | ICD-10-CM | POA: Diagnosis not present

## 2022-12-17 ENCOUNTER — Other Ambulatory Visit: Payer: Self-pay | Admitting: Cardiology

## 2023-01-30 ENCOUNTER — Other Ambulatory Visit: Payer: Self-pay | Admitting: Cardiology

## 2023-02-01 ENCOUNTER — Telehealth: Payer: Self-pay

## 2023-02-01 DIAGNOSIS — R0602 Shortness of breath: Secondary | ICD-10-CM

## 2023-02-01 DIAGNOSIS — R739 Hyperglycemia, unspecified: Secondary | ICD-10-CM

## 2023-02-01 DIAGNOSIS — E559 Vitamin D deficiency, unspecified: Secondary | ICD-10-CM

## 2023-02-01 DIAGNOSIS — I10 Essential (primary) hypertension: Secondary | ICD-10-CM

## 2023-02-01 DIAGNOSIS — E782 Mixed hyperlipidemia: Secondary | ICD-10-CM

## 2023-02-01 DIAGNOSIS — I255 Ischemic cardiomyopathy: Secondary | ICD-10-CM

## 2023-02-01 NOTE — Telephone Encounter (Signed)
Dr. Geraldo Pitter requested pt have labs and 2 view chest Xray at [facility]. Orders faxed to [facility]

## 2023-02-02 ENCOUNTER — Telehealth: Payer: Self-pay

## 2023-02-02 ENCOUNTER — Telehealth: Payer: Self-pay | Admitting: Cardiology

## 2023-02-02 NOTE — Telephone Encounter (Signed)
Left vm to callback. 

## 2023-02-02 NOTE — Telephone Encounter (Signed)
Spoke with Narda Rutherford who states that they found the orderes.

## 2023-02-02 NOTE — Telephone Encounter (Signed)
Received a call from [facility] lab department with a critical value. Lab tech stated that the patient's hemoglobin was 4.8. Spoke with Dr. Bettina Gavia regarding this patient's critical value and he recommended that she go to the ER. I attempted to call the patient and inform her of Dr. Joya Gaskins recommendation but she did not answer the phone. I called her husband and he answered the phone and I informed him of Dr. Joya Gaskins recommendation to go to the ER. The patient's husband stated that he would take her to the ER.

## 2023-02-02 NOTE — Telephone Encounter (Signed)
[facility] is calling to report critical lab values.

## 2023-02-02 NOTE — Telephone Encounter (Signed)
Spoke with Darnelle Maffucci who states that the pt is in the ED for evaluation.

## 2023-02-02 NOTE — Telephone Encounter (Signed)
[phone] (fax), [phone] (phone), name is [place].Marland KitchenMarland KitchenMarland KitchenMarland Kitchenpatient is in office now waiting....please call back asap and fax over orders

## 2023-02-03 DIAGNOSIS — Q211 Atrial septal defect, unspecified: Secondary | ICD-10-CM | POA: Insufficient documentation

## 2023-02-03 HISTORY — DX: Atrial septal defect, unspecified: Q21.10

## 2023-02-21 ENCOUNTER — Ambulatory Visit: Payer: PPO | Attending: Cardiology | Admitting: Cardiology

## 2023-02-21 ENCOUNTER — Telehealth: Payer: Self-pay | Admitting: Cardiology

## 2023-02-21 ENCOUNTER — Telehealth: Payer: Self-pay

## 2023-02-21 VITALS — BP 122/78 | HR 122 | Ht 67.0 in | Wt 176.0 lb

## 2023-02-21 DIAGNOSIS — Z8673 Personal history of transient ischemic attack (TIA), and cerebral infarction without residual deficits: Secondary | ICD-10-CM

## 2023-02-21 DIAGNOSIS — Z9889 Other specified postprocedural states: Secondary | ICD-10-CM | POA: Diagnosis not present

## 2023-02-21 DIAGNOSIS — I10 Essential (primary) hypertension: Secondary | ICD-10-CM | POA: Diagnosis not present

## 2023-02-21 DIAGNOSIS — I639 Cerebral infarction, unspecified: Secondary | ICD-10-CM

## 2023-02-21 DIAGNOSIS — Z952 Presence of prosthetic heart valve: Secondary | ICD-10-CM | POA: Diagnosis not present

## 2023-02-21 DIAGNOSIS — R0602 Shortness of breath: Secondary | ICD-10-CM

## 2023-02-21 DIAGNOSIS — Z8679 Personal history of other diseases of the circulatory system: Secondary | ICD-10-CM

## 2023-02-21 DIAGNOSIS — I255 Ischemic cardiomyopathy: Secondary | ICD-10-CM

## 2023-02-21 DIAGNOSIS — E782 Mixed hyperlipidemia: Secondary | ICD-10-CM

## 2023-02-21 LAB — CBC
Hematocrit: 29.2 % — ABNORMAL LOW (ref 34.0–46.6)
Hemoglobin: 8.4 g/dL — ABNORMAL LOW (ref 11.1–15.9)
MCH: 22.4 pg — ABNORMAL LOW (ref 26.6–33.0)
MCHC: 28.8 g/dL — ABNORMAL LOW (ref 31.5–35.7)
MCV: 78 fL — ABNORMAL LOW (ref 79–97)
Platelets: 313 10*3/uL (ref 150–450)
RBC: 3.75 x10E6/uL — ABNORMAL LOW (ref 3.77–5.28)
RDW: 20.9 % — ABNORMAL HIGH (ref 11.7–15.4)
WBC: 4.3 10*3/uL (ref 3.4–10.8)

## 2023-02-21 LAB — COMPREHENSIVE METABOLIC PANEL
ALT: 38 IU/L — ABNORMAL HIGH (ref 0–32)
AST: 29 IU/L (ref 0–40)
Albumin/Globulin Ratio: 1.9 (ref 1.2–2.2)
Albumin: 4.4 g/dL (ref 3.9–4.9)
Alkaline Phosphatase: 72 IU/L (ref 44–121)
BUN/Creatinine Ratio: 28 (ref 12–28)
BUN: 22 mg/dL (ref 8–27)
Bilirubin Total: 0.9 mg/dL (ref 0.0–1.2)
CO2: 22 mmol/L (ref 20–29)
Calcium: 9.3 mg/dL (ref 8.7–10.3)
Chloride: 103 mmol/L (ref 96–106)
Creatinine, Ser: 0.8 mg/dL (ref 0.57–1.00)
Globulin, Total: 2.3 g/dL (ref 1.5–4.5)
Glucose: 114 mg/dL — ABNORMAL HIGH (ref 70–99)
Potassium: 4.2 mmol/L (ref 3.5–5.2)
Sodium: 140 mmol/L (ref 134–144)
Total Protein: 6.7 g/dL (ref 6.0–8.5)
eGFR: 83 mL/min/{1.73_m2} (ref >59)

## 2023-02-21 LAB — PRO B NATRIURETIC PEPTIDE: NT-Pro BNP: 4604 pg/mL — ABNORMAL HIGH (ref 0–287)

## 2023-02-21 NOTE — Telephone Encounter (Signed)
Appointment made for today

## 2023-02-21 NOTE — Telephone Encounter (Signed)
Pt spouse called in stating pt saw her home health and has some edema. He stated she was seen in ER recently and thought Dr. Geraldo Pitter wanted to see her. He stated she is usually worked into the schedule. Please advise.

## 2023-02-21 NOTE — Patient Instructions (Addendum)
Medication Instructions:  Your physician recommends that you continue on your current medications as directed. Please refer to the Current Medication list given to you today.  *If you need a refill on your cardiac medications before your next appointment, please call your pharmacy*   Lab Work: Your physician recommends that you CMP, Pro BNP and CBC.  If you have labs (blood work) drawn today and your tests are completely normal, you will receive your results only by: [place] (if you have MyChart) OR A paper copy in the mail If you have any lab test that is abnormal or we need to change your treatment, we will call you to review the results.   Testing/Procedures: None ordered   Follow-Up: At [facility], you and your health needs are our priority.  As part of our continuing mission to provide you with exceptional heart care, we have created designated Provider Care Teams.  These Care Teams include your primary Cardiologist (physician) and Advanced Practice Providers (APPs -  Physician Assistants and Nurse Practitioners) who all work together to provide you with the care you need, when you need it.  We recommend signing up for the patient portal called "MyChart".  Sign up information is provided on this After Visit Summary.  MyChart is used to connect with patients for Virtual Visits (Telemedicine).  Patients are able to view lab/test results, encounter notes, upcoming appointments, etc.  Non-urgent messages can be sent to your provider as well.   To learn more about what you can do with MyChart, go to [email or web address].    Your next appointment:   3-4 week(s)  The format for your next appointment:   In Person  Provider:   Jyl Heinz, MD    Other Instructions none  Important Information About [place]

## 2023-02-21 NOTE — Progress Notes (Signed)
Cardiology Office Note:    Date:  02/21/2023   ID:  Stacy Jenkins, DOB Apr 09, 1961, MRN [id number]  PCP:  [name], MD  Cardiologist:  [name], MD   Referring MD: [name], MD    ASSESSMENT:    1. Benign essential hypertension   2. Cerebrovascular accident (CVA), unspecified mechanism ([place])   3. S/P ascending aortic aneurysm repair   4. S/P AVR   5. Mixed dyslipidemia   6. History of CVA (cerebrovascular accident)   7. History of atrial fibrillation    PLAN:    In order of problems listed above:  Patient is in atrial flutter with elevated ventricular rate.  Her last ejection fraction appears to be preserved.  She is very symptomatic from this.  Her blood pressure is borderline.  I think she would be well served by a TEE guided cardioversion.  A TEE will also give me an assessment of her current ejection fraction and cardiac anatomy and physiology.  I discussed this with the patient and her husband at extensive length.  Procedure, benefits and potential risks were explained and she vocalized understanding and questions were answered to her satisfaction.  I have reached out to my colleague to see if we can expedite this as the patient's quality of life is severely compromised because of dyspnea.  In the interim I will do complete blood work including Chem-7 CBC LFTs and TSH.  Patient and husband mentioned to me that since hospital discharge they have taken anticoagulation and have not missed a single dose but because of the fact that this has only been probably 2 weeks I still would not feel safe being a TEE based cardioversion to make sure that she is not high risk for thromboembolism.  I discussed this with him and the vocalized understanding. Post arctic valve replacement: Stable at this time.  Echo will help evaluate this. Aortic root aneurysm postrepair: Stable at this time asymptomatic.  Addendum on 02/22/2023: 8:54 AM  I forgot to mention in the report  yesterday that the option of sending the patient to [facility] ED with ambulance was discussed with her to go to the ER.  She also was given an option to go by herself to get admitted and evaluated.  Patient and husband was not keen on it.  I discussed this with my nurse and CMA also during the time that the patient was present in the office.  I just wanted to document this conversation.  Signed Dr. Sunny Schlein Kayzen Kendzierski   Medication Adjustments/Labs and Tests Ordered: Current medicines are reviewed at length with the patient today.  Concerns regarding medicines are outlined above.  No orders of the defined types were placed in this encounter.  No orders of the defined types were placed in this encounter.    No chief complaint on file.    History of Present Illness:    Stacy Jenkins is a 62 y.o. female patient is a pleasant 62 year old female.  She has past medical history of essential hypertension, cardiomyopathy, aortic aneurysm postrepair and aortic valve replacement.  Her ejection fraction the last time it was studies was within normal limits.  She recently got admitted to the hospital and was found to have severe anemia with a hemoglobin in the range of 5.  She received transfusions.  For a short period of time her Eliquis was discontinued.  This was at the end of the first week of February.  Subsequently evaluation and notes from  [facility] reveals that she had severe diverticulosis.  She was advised to continue Eliquis upon discharge and tells me that she has taken it religiously.  No chest pain orthopnea or PND.  This morning her husband called mentioning that she has short of breath on minimal exertion and has gained weight.  No chest pain orthopnea or PND.  Patient appears comfortable at the time of my evaluation.  Past Medical History:  Diagnosis Date   Abnormal echocardiogram 08/20/2018   Added automatically from request for surgery [id number]   Acute blood  loss anemia 11/19/2018   Acute hyperglycemia 03/10/2016   Acute post-operative pain 11/19/2018   Aortic valve regurgitation 08/20/2018   Formatting of this note might be different from the original. Added automatically from request for surgery [id number]   Aphasia    Ascending aortic aneurysm ([place])    ASD (atrial septal defect) 02/03/2023   Benign essential hypertension 08/09/2017   at goal on current meds  Formatting of this note might be different from the original. at goal on current meds   Cardiomyopathy ([place]) 01/29/2018   Dilated aortic root ([place]) 09/11/2012   Formatting of this note might be different from the original. 4.6 cm   Elevated blood-pressure reading without diagnosis of hypertension 01/29/2018   will monitor  Formatting of this note might be different from the original. will monitor   Expressive aphasia 11/20/2018   Hiatal hernia 01/29/2018   History of atrial fibrillation 11/20/2018   History of CVA (cerebrovascular accident) 08/09/2017   History of migraine 01/29/2018   Hyperlipidemia 01/08/2019   Hypertension 01/08/2019   Hypoglycemia 01/29/2018   dietary info  Formatting of this note might be different from the original. dietary info   Medicare annual wellness visit, subsequent 02/17/2019   Mixed dyslipidemia 08/09/2017   Nonrheumatic aortic valve insufficiency    Other abnormal glucose 01/29/2018   PFO (patent foramen ovale) 09/11/2012   Postoperative atrial fibrillation ([place]) 11/20/2018   S/P ascending aortic aneurysm repair 11/20/2018   S/P AVR 11/19/2018   Stroke (cerebrum) ([place]) - Left MCA branchStroke in a patient with history of prior strokes and mild residual deficits, s/p tPA 07/13/2017   Stroke ([place])    Vitamin D deficiency 01/29/2018    Past Surgical History:  Procedure Laterality Date   aortic valve surgery  02/2016   TEE WITHOUT CARDIOVERSION N/A 07/16/2017   Procedure: TRANSESOPHAGEAL ECHOCARDIOGRAM (TEE);  Surgeon: Sanda Klein, MD;   Location: [facility] END[facility];  Service: Cardiovascular;  Laterality: N/A;    Current Medications: Current Meds  Medication Sig   apixaban (ELIQUIS) 5 MG TABS tablet Take 1 tablet (5 mg total) by mouth 2 (two) times daily.   aspirin EC 81 MG tablet Take 81 mg by mouth daily.   Coenzyme Q-10 200 MG CAPS Take 1 capsule by mouth daily.   dapagliflozin propanediol (FARXIGA) 5 MG TABS tablet Take 1 tablet (5 mg total) by mouth daily before breakfast.   metoprolol succinate (TOPROL-XL) 50 MG 24 hr tablet Take 50 mg by mouth daily.   Multiple Vitamin (MULTIVITAMIN) capsule Take 1 capsule by mouth daily. Unknown strenght   rosuvastatin (CRESTOR) 5 MG tablet Take 1 tablet by mouth once daily   sacubitril-valsartan (ENTRESTO) 24-26 MG Take 1 tablet by mouth 2 (two) times daily.     Allergies:   Penicillin g   Social History   Socioeconomic History   Marital status: Married    Spouse name: Not on file   Number  of children: Not on file   Years of education: Not on file   Highest education level: Not on file  Occupational History   Not on file  Tobacco Use   Smoking status: Never   Smokeless tobacco: Never  Vaping Use   Vaping Use: Never used  Substance and Sexual Activity   Alcohol use: No   Drug use: No   Sexual activity: Not on file  Other Topics Concern   Not on file  Social History Narrative   Not on file   Social Determinants of Health   Financial Resource Strain: Not on file  Food Insecurity: No Food Insecurity (10/05/2022)   Hunger Vital Sign    Worried About Running Out of Food in the Last Year: Never true    Ran Out of Food in the Last Year: Never true  Transportation Needs: No Transportation Needs (10/05/2022)   PRAPARE - Hydrologist (Medical): No    Lack of Transportation (Non-Medical): No  Physical Activity: Not on file  Stress: Not on file  Social Connections: Not on file     Family History: The patient's family history includes Heart  attack in her father; Heart disease in her brother and father; Hypertension in her mother.  ROS:   Please see the history of present illness.    All other systems reviewed and are negative.  EKGs/Labs/Other Studies Reviewed:    The following studies were reviewed today: EKG reveals a flutter and elevated ventricular rate.   Recent Labs: 08/04/2022: ALT 9; BUN 24; Creatinine, Ser 0.80; Hemoglobin 10.9; Platelets 216; Potassium 4.1; Sodium 140; TSH 2.980  Recent Lipid Panel    Component Value Date/Time   CHOL 167 08/04/2022 1028   TRIG 93 08/04/2022 1028   HDL 68 08/04/2022 1028   CHOLHDL 2.5 08/04/2022 1028   CHOLHDL 2.7 07/14/2017 0241   VLDL 19 07/14/2017 0241   LDLCALC 82 08/04/2022 1028    Physical Exam:    VS:  BP 122/78   Pulse (!) 122   Ht '5\' 7"'$  (1.702 m)   Wt 176 lb (79.8 kg)   SpO2 92%   BMI 27.57 kg/m     Wt Readings from Last 3 Encounters:  02/21/23 176 lb (79.8 kg)  08/01/22 168 lb 12.8 oz (76.6 kg)  01/19/22 174 lb 9.6 oz (79.2 kg)     GEN: Patient is in no acute distress HEENT: Normal NECK: No JVD; No carotid bruits LYMPHATICS: No lymphadenopathy CARDIAC: Hear sounds regular, 2/6 systolic murmur at the apex. RESPIRATORY:  Clear to auscultation without rales, wheezing or rhonchi  ABDOMEN: Soft, non-tender, non-distended MUSCULOSKELETAL:  No edema; No deformity  SKIN: Warm and dry NEUROLOGIC:  Alert and oriented x 3 PSYCHIATRIC:  Normal affect   Signed, [name], MD  02/21/2023 11:50 AM    [place]

## 2023-02-21 NOTE — Telephone Encounter (Signed)
Spoke with Herbie Baltimore per DPR and advised to arrive at [facility] at 1:00 on 02/22/23. Instructions reviewed as below. Herbie Baltimore verbalized understanding and had no additional questions.   Your physician has recommended that you have a Cardioversion (DCCV). Electrical Cardioversion uses a jolt of electricity to your heart either through paddles or wired patches attached to your chest. This is a controlled, usually prescheduled, procedure. Defibrillation is done under light anesthesia in the hospital, and you usually go home the day of the procedure. This is done to get your heart back into a normal rhythm. You are not awake for the procedure.     DATE AND TIME OF PROCEDURE: 2/229/24 1400  Please register at the Admitting Department at: 1300  DO NOT EAT OR DRINK ANYTHING after midnight prior to your procedure.  You should take your medications as usual with a sip of water.  If you are diabetic, DO NOT TAKE YOUR DIABETIC MEDICATIONS the morning OF THE PROCEDURE.  If you are taking Lanoxin (also called Digoxin), do NOT take this medication the morning of the procedure.  DO NOT STOP or miss any doses of your  ELIQUIS blood thinners that you are taking.  If you miss a dose of this medication let us know as soon as possible as we will need to reschedule your procedure. Missing a dose of the blood thinner and continuing with the cardioversion places you at greater risk for having a stroke.  Have your blood drawn as intructed.  You will need someone with you to drive you home after the procedure.

## 2023-02-22 ENCOUNTER — Inpatient Hospital Stay (HOSPITAL_COMMUNITY)
Admission: EM | Admit: 2023-02-22 | Discharge: 2023-03-08 | DRG: 286 | Disposition: A | Discharge status: Home Health | Payer: PPO | Attending: Internal Medicine | Admitting: Internal Medicine

## 2023-02-22 ENCOUNTER — Other Ambulatory Visit: Payer: Self-pay

## 2023-02-22 ENCOUNTER — Emergency Department (HOSPITAL_COMMUNITY): Payer: PPO

## 2023-02-22 ENCOUNTER — Encounter (HOSPITAL_COMMUNITY)
Admission: RE | Disposition: A | Discharge status: Other Acute Inpatient Hospital | Payer: Self-pay | Source: Home / Self Care | Attending: Internal Medicine

## 2023-02-22 ENCOUNTER — Encounter (HOSPITAL_COMMUNITY): Payer: Self-pay

## 2023-02-22 ENCOUNTER — Ambulatory Visit (HOSPITAL_COMMUNITY): Admission: RE | Admit: 2023-02-22 | Payer: PPO | Source: Ambulatory Visit

## 2023-02-22 ENCOUNTER — Ambulatory Visit (HOSPITAL_COMMUNITY): Payer: PPO | Admitting: Anesthesiology

## 2023-02-22 ENCOUNTER — Ambulatory Visit (HOSPITAL_COMMUNITY)
Admission: RE | Admit: 2023-02-22 | Discharge: 2023-02-22 | Disposition: A | Discharge status: Other Acute Inpatient Hospital | Payer: PPO | Source: Home / Self Care | Attending: Internal Medicine | Admitting: Internal Medicine

## 2023-02-22 ENCOUNTER — Encounter (HOSPITAL_COMMUNITY): Payer: Self-pay | Admitting: Internal Medicine

## 2023-02-22 DIAGNOSIS — Z8679 Personal history of other diseases of the circulatory system: Secondary | ICD-10-CM

## 2023-02-22 DIAGNOSIS — Q2111 Secundum atrial septal defect: Secondary | ICD-10-CM

## 2023-02-22 DIAGNOSIS — I4819 Other persistent atrial fibrillation: Secondary | ICD-10-CM | POA: Diagnosis present

## 2023-02-22 DIAGNOSIS — I4891 Unspecified atrial fibrillation: Secondary | ICD-10-CM | POA: Insufficient documentation

## 2023-02-22 DIAGNOSIS — I428 Other cardiomyopathies: Secondary | ICD-10-CM | POA: Diagnosis present

## 2023-02-22 DIAGNOSIS — I110 Hypertensive heart disease with heart failure: Principal | ICD-10-CM | POA: Diagnosis present

## 2023-02-22 DIAGNOSIS — I472 Ventricular tachycardia, unspecified: Secondary | ICD-10-CM | POA: Diagnosis not present

## 2023-02-22 DIAGNOSIS — Z7901 Long term (current) use of anticoagulants: Secondary | ICD-10-CM

## 2023-02-22 DIAGNOSIS — Z79899 Other long term (current) drug therapy: Secondary | ICD-10-CM

## 2023-02-22 DIAGNOSIS — D509 Iron deficiency anemia, unspecified: Secondary | ICD-10-CM | POA: Diagnosis present

## 2023-02-22 DIAGNOSIS — I4892 Unspecified atrial flutter: Secondary | ICD-10-CM | POA: Diagnosis not present

## 2023-02-22 DIAGNOSIS — R570 Cardiogenic shock: Secondary | ICD-10-CM | POA: Diagnosis not present

## 2023-02-22 DIAGNOSIS — I493 Ventricular premature depolarization: Secondary | ICD-10-CM | POA: Diagnosis not present

## 2023-02-22 DIAGNOSIS — J810 Acute pulmonary edema: Secondary | ICD-10-CM | POA: Diagnosis not present

## 2023-02-22 DIAGNOSIS — I5023 Acute on chronic systolic (congestive) heart failure: Secondary | ICD-10-CM | POA: Diagnosis present

## 2023-02-22 DIAGNOSIS — R0600 Dyspnea, unspecified: Principal | ICD-10-CM

## 2023-02-22 DIAGNOSIS — R4701 Aphasia: Secondary | ICD-10-CM | POA: Diagnosis present

## 2023-02-22 DIAGNOSIS — Z8673 Personal history of transient ischemic attack (TIA), and cerebral infarction without residual deficits: Secondary | ICD-10-CM

## 2023-02-22 DIAGNOSIS — I4430 Unspecified atrioventricular block: Secondary | ICD-10-CM | POA: Diagnosis present

## 2023-02-22 DIAGNOSIS — Z952 Presence of prosthetic heart valve: Secondary | ICD-10-CM | POA: Diagnosis not present

## 2023-02-22 DIAGNOSIS — I712 Thoracic aortic aneurysm, without rupture, unspecified: Secondary | ICD-10-CM | POA: Diagnosis present

## 2023-02-22 DIAGNOSIS — I639 Cerebral infarction, unspecified: Secondary | ICD-10-CM

## 2023-02-22 DIAGNOSIS — Z515 Encounter for palliative care: Secondary | ICD-10-CM | POA: Diagnosis not present

## 2023-02-22 DIAGNOSIS — I484 Atypical atrial flutter: Secondary | ICD-10-CM | POA: Diagnosis not present

## 2023-02-22 DIAGNOSIS — I340 Nonrheumatic mitral (valve) insufficiency: Secondary | ICD-10-CM

## 2023-02-22 DIAGNOSIS — Z7982 Long term (current) use of aspirin: Secondary | ICD-10-CM

## 2023-02-22 DIAGNOSIS — E119 Type 2 diabetes mellitus without complications: Secondary | ICD-10-CM | POA: Diagnosis present

## 2023-02-22 DIAGNOSIS — J9601 Acute respiratory failure with hypoxia: Secondary | ICD-10-CM | POA: Diagnosis present

## 2023-02-22 DIAGNOSIS — Q2112 Patent foramen ovale: Secondary | ICD-10-CM | POA: Diagnosis not present

## 2023-02-22 DIAGNOSIS — Z5309 Procedure and treatment not carried out because of other contraindication: Secondary | ICD-10-CM | POA: Insufficient documentation

## 2023-02-22 DIAGNOSIS — E871 Hypo-osmolality and hyponatremia: Secondary | ICD-10-CM | POA: Diagnosis present

## 2023-02-22 DIAGNOSIS — E782 Mixed hyperlipidemia: Secondary | ICD-10-CM | POA: Diagnosis present

## 2023-02-22 DIAGNOSIS — Z66 Do not resuscitate: Secondary | ICD-10-CM | POA: Diagnosis not present

## 2023-02-22 DIAGNOSIS — I255 Ischemic cardiomyopathy: Secondary | ICD-10-CM

## 2023-02-22 DIAGNOSIS — D6869 Other thrombophilia: Secondary | ICD-10-CM

## 2023-02-22 DIAGNOSIS — E440 Moderate protein-calorie malnutrition: Secondary | ICD-10-CM | POA: Diagnosis present

## 2023-02-22 DIAGNOSIS — I081 Rheumatic disorders of both mitral and tricuspid valves: Secondary | ICD-10-CM | POA: Diagnosis not present

## 2023-02-22 DIAGNOSIS — Z6824 Body mass index (BMI) 24.0-24.9, adult: Secondary | ICD-10-CM

## 2023-02-22 DIAGNOSIS — F4321 Adjustment disorder with depressed mood: Secondary | ICD-10-CM | POA: Diagnosis not present

## 2023-02-22 DIAGNOSIS — Z880 Allergy status to penicillin: Secondary | ICD-10-CM

## 2023-02-22 DIAGNOSIS — I2510 Atherosclerotic heart disease of native coronary artery without angina pectoris: Secondary | ICD-10-CM | POA: Diagnosis present

## 2023-02-22 DIAGNOSIS — Z1152 Encounter for screening for COVID-19: Secondary | ICD-10-CM | POA: Diagnosis not present

## 2023-02-22 DIAGNOSIS — G4733 Obstructive sleep apnea (adult) (pediatric): Secondary | ICD-10-CM | POA: Diagnosis present

## 2023-02-22 DIAGNOSIS — R410 Disorientation, unspecified: Secondary | ICD-10-CM | POA: Diagnosis present

## 2023-02-22 DIAGNOSIS — E876 Hypokalemia: Secondary | ICD-10-CM | POA: Diagnosis present

## 2023-02-22 DIAGNOSIS — R1310 Dysphagia, unspecified: Secondary | ICD-10-CM | POA: Diagnosis present

## 2023-02-22 DIAGNOSIS — R0602 Shortness of breath: Secondary | ICD-10-CM

## 2023-02-22 DIAGNOSIS — Z953 Presence of xenogenic heart valve: Secondary | ICD-10-CM

## 2023-02-22 DIAGNOSIS — I10 Essential (primary) hypertension: Secondary | ICD-10-CM

## 2023-02-22 DIAGNOSIS — Z7189 Other specified counseling: Secondary | ICD-10-CM | POA: Diagnosis not present

## 2023-02-22 DIAGNOSIS — F3289 Other specified depressive episodes: Secondary | ICD-10-CM | POA: Diagnosis not present

## 2023-02-22 DIAGNOSIS — D649 Anemia, unspecified: Secondary | ICD-10-CM | POA: Diagnosis not present

## 2023-02-22 DIAGNOSIS — E559 Vitamin D deficiency, unspecified: Secondary | ICD-10-CM | POA: Diagnosis present

## 2023-02-22 DIAGNOSIS — K449 Diaphragmatic hernia without obstruction or gangrene: Secondary | ICD-10-CM | POA: Diagnosis not present

## 2023-02-22 DIAGNOSIS — Z8249 Family history of ischemic heart disease and other diseases of the circulatory system: Secondary | ICD-10-CM

## 2023-02-22 LAB — CBC WITH DIFFERENTIAL/PLATELET
Abs Immature Granulocytes: 0 10*3/uL (ref 0.00–0.07)
Basophils Absolute: 0 10*3/uL (ref 0.0–0.1)
Basophils Relative: 1 %
Eosinophils Absolute: 0 10*3/uL (ref 0.0–0.5)
Eosinophils Relative: 0 %
HCT: 31.1 % — ABNORMAL LOW (ref 36.0–46.0)
Hemoglobin: 8.7 g/dL — ABNORMAL LOW (ref 12.0–15.0)
Lymphocytes Relative: 17 %
Lymphs Abs: 0.8 10*3/uL (ref 0.7–4.0)
MCH: 22.4 pg — ABNORMAL LOW (ref 26.0–34.0)
MCHC: 28 g/dL — ABNORMAL LOW (ref 30.0–36.0)
MCV: 80.2 fL (ref 80.0–100.0)
Monocytes Absolute: 0.2 10*3/uL (ref 0.1–1.0)
Monocytes Relative: 5 %
Neutro Abs: 3.6 10*3/uL (ref 1.7–7.7)
Neutrophils Relative %: 77 %
Platelets: 316 10*3/uL (ref 150–400)
RBC: 3.88 MIL/uL (ref 3.87–5.11)
RDW: 23.4 % — ABNORMAL HIGH (ref 11.5–15.5)
WBC: 4.7 10*3/uL (ref 4.0–10.5)
nRBC: 0 % (ref 0.0–0.2)
nRBC: 0 /100 WBC

## 2023-02-22 LAB — I-STAT CHEM 8, ED
BUN: 27 mg/dL — ABNORMAL HIGH (ref 8–23)
Calcium, Ion: 1.08 mmol/L — ABNORMAL LOW (ref 1.15–1.40)
Chloride: 106 mmol/L (ref 98–111)
Creatinine, Ser: 0.7 mg/dL (ref 0.44–1.00)
Glucose, Bld: 125 mg/dL — ABNORMAL HIGH (ref 70–99)
HCT: 30 % — ABNORMAL LOW (ref 36.0–46.0)
Hemoglobin: 10.2 g/dL — ABNORMAL LOW (ref 12.0–15.0)
Potassium: 4.2 mmol/L (ref 3.5–5.1)
Sodium: 138 mmol/L (ref 135–145)
TCO2: 22 mmol/L (ref 22–32)

## 2023-02-22 LAB — COMPREHENSIVE METABOLIC PANEL
ALT: 43 U/L (ref 0–44)
AST: 39 U/L (ref 15–41)
Albumin: 3.8 g/dL (ref 3.5–5.0)
Alkaline Phosphatase: 59 U/L (ref 38–126)
Anion gap: 12 (ref 5–15)
BUN: 28 mg/dL — ABNORMAL HIGH (ref 8–23)
CO2: 20 mmol/L — ABNORMAL LOW (ref 22–32)
Calcium: 8.8 mg/dL — ABNORMAL LOW (ref 8.9–10.3)
Chloride: 105 mmol/L (ref 98–111)
Creatinine, Ser: 0.85 mg/dL (ref 0.44–1.00)
GFR, Estimated: >60 mL/min (ref >60)
Glucose, Bld: 129 mg/dL — ABNORMAL HIGH (ref 70–99)
Potassium: 4.2 mmol/L (ref 3.5–5.1)
Sodium: 137 mmol/L (ref 135–145)
Total Bilirubin: 1.6 mg/dL — ABNORMAL HIGH (ref 0.3–1.2)
Total Protein: 6.7 g/dL (ref 6.5–8.1)

## 2023-02-22 LAB — TROPONIN I (HIGH SENSITIVITY)
Troponin I (High Sensitivity): 45 ng/L — ABNORMAL HIGH (ref <18)
Troponin I (High Sensitivity): 44 ng/L — ABNORMAL HIGH (ref <18)

## 2023-02-22 LAB — BRAIN NATRIURETIC PEPTIDE: B Natriuretic Peptide: 876.3 pg/mL — ABNORMAL HIGH (ref 0.0–100.0)

## 2023-02-22 LAB — RESP PANEL BY RT-PCR (RSV, FLU A&B, COVID)  RVPGX2
Influenza A by PCR: NEGATIVE
Influenza B by PCR: NEGATIVE
Resp Syncytial Virus by PCR: NEGATIVE
SARS Coronavirus 2 by RT PCR: NEGATIVE

## 2023-02-22 LAB — LACTIC ACID, PLASMA
Lactic Acid, Venous: 2.5 mmol/L (ref 0.5–1.9)
Lactic Acid, Venous: 2.2 mmol/L (ref 0.5–1.9)

## 2023-02-22 SURGERY — CANCELLED PROCEDURE
Anesthesia: Monitor Anesthesia Care

## 2023-02-22 MED ORDER — FUROSEMIDE 10 MG/ML IJ SOLN
40.0000 mg | Freq: Once | INTRAMUSCULAR | Status: AC
  Administered 2023-02-22: 40 mg via INTRAVENOUS
  Filled 2023-02-22: qty 4

## 2023-02-22 MED ORDER — ONDANSETRON HCL 4 MG/2ML IJ SOLN: 4.0000 mg | Freq: Four times a day (QID) | INTRAMUSCULAR | Status: DC | PRN

## 2023-02-22 MED ORDER — APIXABAN 5 MG PO TABS
5.0000 mg | ORAL_TABLET | Freq: Two times a day (BID) | ORAL | Status: DC
  Administered 2023-02-22 – 2023-02-25 (×7): 5 mg via ORAL
  Filled 2023-02-22 (×8): qty 1

## 2023-02-22 MED ORDER — ROSUVASTATIN CALCIUM 5 MG PO TABS
5.0000 mg | ORAL_TABLET | Freq: Every day | ORAL | Status: DC
  Administered 2023-02-23 – 2023-03-08 (×14): 5 mg via ORAL
  Filled 2023-02-22 (×15): qty 1

## 2023-02-22 MED ORDER — OXYCODONE HCL 5 MG PO TABS: 5.0000 mg | ORAL_TABLET | Freq: Once | ORAL | Status: DC | PRN

## 2023-02-22 MED ORDER — ORAL CARE MOUTH RINSE
15.0000 mL | OROMUCOSAL | Status: DC
  Administered 2023-02-23 – 2023-03-08 (×50): 15 mL via OROMUCOSAL

## 2023-02-22 MED ORDER — OXYCODONE HCL 5 MG/5ML PO SOLN: 5.0000 mg | Freq: Once | ORAL | Status: DC | PRN

## 2023-02-22 MED ORDER — AMIODARONE LOAD VIA INFUSION
150.0000 mg | Freq: Once | INTRAVENOUS | Status: AC
  Administered 2023-02-22: 150 mg via INTRAVENOUS
  Filled 2023-02-22: qty 83.34

## 2023-02-22 MED ORDER — FENTANYL CITRATE (PF) 100 MCG/2ML IJ SOLN: 25.0000 ug | INTRAMUSCULAR | Status: DC | PRN

## 2023-02-22 MED ORDER — SODIUM CHLORIDE 0.9% FLUSH
3.0000 mL | Freq: Two times a day (BID) | INTRAVENOUS | Status: DC
  Administered 2023-02-22 – 2023-03-07 (×17): 3 mL via INTRAVENOUS

## 2023-02-22 MED ORDER — ACETAMINOPHEN 325 MG PO TABS: 650.0000 mg | ORAL_TABLET | ORAL | Status: DC | PRN

## 2023-02-22 MED ORDER — SODIUM CHLORIDE 0.9% FLUSH: 3.0000 mL | INTRAVENOUS | Status: DC | PRN

## 2023-02-22 MED ORDER — ORAL CARE MOUTH RINSE: 15.0000 mL | OROMUCOSAL | Status: DC | PRN

## 2023-02-22 MED ORDER — FUROSEMIDE 10 MG/ML IJ SOLN
80.0000 mg | Freq: Two times a day (BID) | INTRAMUSCULAR | Status: DC
  Administered 2023-02-23 – 2023-02-25 (×5): 80 mg via INTRAVENOUS
  Filled 2023-02-22 (×5): qty 8

## 2023-02-22 MED ORDER — DILTIAZEM HCL-DEXTROSE 125-5 MG/125ML-% IV SOLN (PREMIX)
5.0000 mg/h | INTRAVENOUS | Status: DC
  Administered 2023-02-22: 5 mg/h via INTRAVENOUS
  Filled 2023-02-22: qty 125

## 2023-02-22 MED ORDER — POTASSIUM CHLORIDE CRYS ER 20 MEQ PO TBCR
20.0000 meq | EXTENDED_RELEASE_TABLET | Freq: Two times a day (BID) | ORAL | Status: DC
  Administered 2023-02-23 – 2023-02-25 (×6): 20 meq via ORAL
  Filled 2023-02-22 (×6): qty 1

## 2023-02-22 MED ORDER — AMIODARONE HCL IN DEXTROSE 360-4.14 MG/200ML-% IV SOLN
60.0000 mg/h | INTRAVENOUS | Status: DC
  Administered 2023-02-22 (×2): 60 mg/h via INTRAVENOUS
  Filled 2023-02-22 (×2): qty 200

## 2023-02-22 MED ORDER — AMIODARONE HCL IN DEXTROSE 360-4.14 MG/200ML-% IV SOLN
30.0000 mg/h | INTRAVENOUS | Status: DC
  Administered 2023-02-22 – 2023-02-24 (×4): 30 mg/h via INTRAVENOUS
  Filled 2023-02-22 (×3): qty 200

## 2023-02-22 MED ORDER — SODIUM CHLORIDE 0.9 % IV SOLN
250.0000 mL | INTRAVENOUS | Status: DC | PRN
  Administered 2023-02-28: 250 mL via INTRAVENOUS

## 2023-02-22 MED ORDER — SODIUM CHLORIDE 0.9 % IV SOLN: INTRAVENOUS | Status: DC

## 2023-02-22 NOTE — Progress Notes (Signed)
Patient very short of breath upon getting to endo unit. P/o on room air was 96 percent. O2 2lpm applied, p/o increased to 88 percent, o2 increased to 4lper min, p/o 92 percent.  Plus 4 pitting edema in bilateral legs.  Dr and anesthesia notified.

## 2023-02-22 NOTE — ED Notes (Signed)
ED TO INPATIENT HANDOFF REPORT  ED Nurse Name and Phone #: Rock Nephew [phone] Name/Age/Gender Stacy Jenkins 62 y.o. female Room/Bed: 031C/031C  Code Status   Code Status: Full Code  Home/SNF/Other Home Patient oriented to: self, place, time, and situation Is this baseline? Yes   Triage Complete: Triage complete  Chief Complaint Acute on chronic systolic heart failure ([place]) [I50.23]  Triage Note Pt arrived for scheduled TEE today by advisement of cardiology and was brought to ED via rapid response nurse for eval of shortness of breath and tachycardia prior to procedure. Patient denies complaints. BLE pitting edema, hypoxic in endo on their admission vitals.    Allergies Allergies  Allergen Reactions   Penicillin G Rash    Pt states she is not allergic    Level of Care/Admitting Diagnosis ED Disposition     ED Disposition  Admit   Condition  --   Comment  Hospital Area: [facility] [[id number]]  Level of Care: ICU [6]  May admit patient to [facility] or [facility] if equivalent level of care is available:: No  Covid Evaluation: Asymptomatic - no recent exposure (last 10 days) testing not required  Diagnosis: Acute on chronic systolic heart failure ([place]) [428.23.ICD-9-CM]  Admitting Physician: Freada Bergeron [id number]  Attending Physician: Freada Bergeron [id number]  Certification:: I certify this patient will need inpatient services for at least 2 midnights  Estimated Length of Stay: 4          B Medical/Surgery History Past Medical History:  Diagnosis Date   Abnormal echocardiogram 08/20/2018   Added automatically from request for surgery [id number]   Acute blood loss anemia 11/19/2018   Acute hyperglycemia 03/10/2016   Acute post-operative pain 11/19/2018   Aortic valve regurgitation 08/20/2018   Formatting of this note might be different from the original. Added automatically from request for surgery [id number]   Aphasia     Ascending aortic aneurysm ([place])    ASD (atrial septal defect) 02/03/2023   Benign essential hypertension 08/09/2017   at goal on current meds  Formatting of this note might be different from the original. at goal on current meds   Cardiomyopathy ([place]) 01/29/2018   Dilated aortic root ([place]) 09/11/2012   Formatting of this note might be different from the original. 4.6 cm   Elevated blood-pressure reading without diagnosis of hypertension 01/29/2018   will monitor  Formatting of this note might be different from the original. will monitor   Expressive aphasia 11/20/2018   Hiatal hernia 01/29/2018   History of atrial fibrillation 11/20/2018   History of CVA (cerebrovascular accident) 08/09/2017   History of migraine 01/29/2018   Hyperlipidemia 01/08/2019   Hypertension 01/08/2019   Hypoglycemia 01/29/2018   dietary info  Formatting of this note might be different from the original. dietary info   Medicare annual wellness visit, subsequent 02/17/2019   Mixed dyslipidemia 08/09/2017   Nonrheumatic aortic valve insufficiency    Other abnormal glucose 01/29/2018   PFO (patent foramen ovale) 09/11/2012   Postoperative atrial fibrillation ([place]) 11/20/2018   S/P ascending aortic aneurysm repair 11/20/2018   S/P AVR 11/19/2018   Stroke (cerebrum) ([place]) - Left MCA branchStroke in a patient with history of prior strokes and mild residual deficits, s/p tPA 07/13/2017   Stroke ([place])    Vitamin D deficiency 01/29/2018   Past Surgical History:  Procedure Laterality Date   aortic valve surgery  02/2016   TEE WITHOUT CARDIOVERSION N/A 07/16/2017  Procedure: TRANSESOPHAGEAL ECHOCARDIOGRAM (TEE);  Surgeon: Sanda Klein, MD;  Location: [facility];  Service: Cardiovascular;  Laterality: N/A;     A IV Location/Drains/Wounds Patient Lines/Drains/Airways Status     Active Line/Drains/Airways     Name Placement date Placement time Site Days   Peripheral IV 02/22/23 20 G Posterior;Right Hand  02/22/23  1353  Hand  less than 1   Peripheral IV 02/22/23 18 G 1.25" Anterior;Proximal;Right Forearm 02/22/23  1532  Forearm  less than 1   Urethral Catheter [name] Straight-tip 14 Fr. 02/22/23  1710  Straight-tip  less than 1            Intake/Output Last 24 hours  Intake/Output Summary (Last 24 hours) at 02/22/2023 1750 Last data filed at 02/22/2023 1657 Gross per 24 hour  Intake 2.86 ml  Output --  Net 2.86 ml    Labs/Imaging Results for orders placed or performed during the hospital encounter of 02/22/23 (from the past 48 hour(s))  Resp panel by RT-PCR (RSV, Flu A&B, Covid) Anterior Nasal Swab     Status: None   Collection Time: 02/22/23  3:15 PM   Specimen: Anterior Nasal Swab  Result Value Ref Range   SARS Coronavirus 2 by RT PCR NEGATIVE NEGATIVE   Influenza A by PCR NEGATIVE NEGATIVE   Influenza B by PCR NEGATIVE NEGATIVE    Comment: (NOTE) The Xpert Xpress SARS-CoV-2/FLU/RSV plus assay is intended as an aid in the diagnosis of influenza from Nasopharyngeal swab specimens and should not be used as a sole basis for treatment. Nasal washings and aspirates are unacceptable for Xpert Xpress SARS-CoV-2/FLU/RSV testing.  Fact Sheet for Patients: EntrepreneurPulse.com.au  Fact Sheet for Healthcare Providers: IncredibleEmployment.be  This test is not yet approved or cleared by the [place] FDA and has been authorized for detection and/or diagnosis of SARS-CoV-2 by FDA under an Emergency Use Authorization (EUA). This EUA will remain in effect (meaning this test can be used) for the duration of the COVID-19 declaration under Section 564(b)(1) of the Act, 21 U.S.C. section 360bbb-3(b)(1), unless the authorization is terminated or revoked.     Resp Syncytial Virus by PCR NEGATIVE NEGATIVE    Comment: (NOTE) Fact Sheet for Patients: EntrepreneurPulse.com.au  Fact Sheet for Healthcare  Providers: IncredibleEmployment.be  This test is not yet approved or cleared by the [place] FDA and has been authorized for detection and/or diagnosis of SARS-CoV-2 by FDA under an Emergency Use Authorization (EUA). This EUA will remain in effect (meaning this test can be used) for the duration of the COVID-19 declaration under Section 564(b)(1) of the Act, 21 U.S.C. section 360bbb-3(b)(1), unless the authorization is terminated or revoked.  Performed at [facility], [place] [address], [place], [place] [address]   CBC with Differential     Status: Abnormal   Collection Time: 02/22/23  3:34 PM  Result Value Ref Range   WBC 4.7 4.0 - 10.5 K/uL   RBC 3.88 3.87 - 5.11 MIL/uL   Hemoglobin 8.7 (L) 12.0 - 15.0 g/dL   HCT 31.1 (L) 36.0 - 46.0 %   MCV 80.2 80.0 - 100.0 fL   MCH 22.4 (L) 26.0 - 34.0 pg   MCHC 28.0 (L) 30.0 - 36.0 g/dL   RDW 23.4 (H) 11.5 - 15.5 %   Platelets 316 150 - 400 K/uL   nRBC 0.0 0.0 - 0.2 %   Neutrophils Relative % 77 %   Neutro Abs 3.6 1.7 - 7.7 K/uL   Lymphocytes Relative 17 %  Lymphs Abs 0.8 0.7 - 4.0 K/uL   Monocytes Relative 5 %   Monocytes Absolute 0.2 0.1 - 1.0 K/uL   Eosinophils Relative 0 %   Eosinophils Absolute 0.0 0.0 - 0.5 K/uL   Basophils Relative 1 %   Basophils Absolute 0.0 0.0 - 0.1 K/uL   WBC Morphology WHITE COUNT CONFIRMED ON SMEAR    RBC Morphology MORPHOLOGY UNREMARKABLE    Smear Review PLATELET COUNT CONFIRMED BY SMEAR    nRBC 0 0 /100 WBC   Abs Immature Granulocytes 0.00 0.00 - 0.07 K/uL    Comment: Performed at [facility], [place] [address] [place], [place] [address]  Troponin I (High Sensitivity)     Status: Abnormal   Collection Time: 02/22/23  3:34 PM  Result Value Ref Range   Troponin I (High Sensitivity) 45 (H) <18 ng/L    Comment: (NOTE) Elevated high sensitivity troponin I (hsTnI) values and significant  changes across serial measurements may suggest ACS but many other  chronic and  acute conditions are known to elevate hsTnI results.  Refer to the "Links" section for chest pain algorithms and additional  guidance. Performed at [facility], [place] [address] [place], [place] [address]   Comprehensive metabolic panel     Status: Abnormal   Collection Time: 02/22/23  3:34 PM  Result Value Ref Range   Sodium 137 135 - 145 mmol/L   Potassium 4.2 3.5 - 5.1 mmol/L   Chloride 105 98 - 111 mmol/L   CO2 20 (L) 22 - 32 mmol/L   Glucose, Bld 129 (H) 70 - 99 mg/dL    Comment: Glucose reference range applies only to samples taken after fasting for at least 8 hours.   BUN 28 (H) 8 - 23 mg/dL   Creatinine, Ser 0.85 0.44 - 1.00 mg/dL   Calcium 8.8 (L) 8.9 - 10.3 mg/dL   Total Protein 6.7 6.5 - 8.1 g/dL   Albumin 3.8 3.5 - 5.0 g/dL   AST 39 15 - 41 U/L   ALT 43 0 - 44 U/L   Alkaline Phosphatase 59 38 - 126 U/L   Total Bilirubin 1.6 (H) 0.3 - 1.2 mg/dL   GFR, Estimated >60 >60 mL/min    Comment: (NOTE) Calculated using the CKD-EPI Creatinine Equation (2021)    Anion gap 12 5 - 15    Comment: Performed at [facility] [address], [place], [place] [address]  Brain natriuretic peptide     Status: Abnormal   Collection Time: 02/22/23  3:34 PM  Result Value Ref Range   B Natriuretic Peptide 876.3 (H) 0.0 - 100.0 pg/mL    Comment: Performed at [facility] [address], [place], [place] [address]  I-stat chem 8, ED     Status: Abnormal   Collection Time: 02/22/23  3:52 PM  Result Value Ref Range   Sodium 138 135 - 145 mmol/L   Potassium 4.2 3.5 - 5.1 mmol/L   Chloride 106 98 - 111 mmol/L   BUN 27 (H) 8 - 23 mg/dL   Creatinine, Ser 0.70 0.44 - 1.00 mg/dL   Glucose, Bld 125 (H) 70 - 99 mg/dL    Comment: Glucose reference range applies only to samples taken after fasting for at least 8 hours.   Calcium, Ion 1.08 (L) 1.15 - 1.40 mmol/L   TCO2 22 22 - 32 mmol/L   Hemoglobin 10.2 (L) 12.0 - 15.0 g/dL   HCT 30.0 (L) 36.0 -  46.0 %   DG Chest Port 1  View  Result Date: 02/22/2023 CLINICAL DATA:  Shortness of breath. EXAM: PORTABLE CHEST 1 VIEW COMPARISON:  02/02/2023. FINDINGS: Moderate pulmonary edema with increased cardiomegaly and small right pleural effusion. No pneumothorax. Unchanged postoperative appearance of the median sternotomy with TAVR in place. Moderate hiatal hernia, unchanged. IMPRESSION: Moderate pulmonary edema with small right pleural effusion. Electronically Signed   By: Emmit Alexanders M.D.   On: 02/22/2023 15:34    Pending Labs Unresulted Labs (From admission, onward)     Start     Ordered   02/22/23 1706  Lactic acid, plasma  Now then every 2 hours,   R (with STAT occurrences)      02/22/23 1705   Signed and Held  Basic metabolic panel  Daily,   R     Comments: As Scheduled for 5 days    Signed and Held            Vitals/Pain Today's Vitals   02/22/23 1635 02/22/23 1641 02/22/23 1648 02/22/23 1700  BP:  100/78 100/78 112/79  Pulse:  98  83  Resp:  (!) 36  (!) 22  Temp:      SpO2: 91%   99%  PainSc:        Isolation Precautions Airborne and Contact precautions  Medications Medications  amiodarone (NEXTERONE PREMIX) 360-4.14 MG/200ML-% (1.8 mg/mL) IV infusion (60 mg/hr Intravenous New Bag/Given 02/22/23 1704)    Followed by  amiodarone (NEXTERONE PREMIX) 360-4.14 MG/200ML-% (1.8 mg/mL) IV infusion (has no administration in time range)  furosemide (LASIX) injection 40 mg (40 mg Intravenous Given 02/22/23 1639)  amiodarone (NEXTERONE) 1.8 mg/mL load via infusion 150 mg (150 mg Intravenous Bolus from Bag 02/22/23 1714)    Mobility non-ambulatory     Focused Assessments Cardiac Assessment Handoff:    No results found for: "CKTOTAL", "CKMB", "CKMBINDEX", "TROPONINI" No results found for: "DDIMER" Does the Patient currently have chest pain? No    R Recommendations: See Admitting Provider Note  Report given to:   Additional Notes: AO x 4, resting on bipap.

## 2023-02-22 NOTE — ED Notes (Signed)
Pt O2 currently observed to be 87% at 4LPM [place]. Increased to 6LPM [place]. O2 at 90% on the monitor. MD is at bedside who notified RN to call RT for bipap if O2 doesn't get better. Pt is very tachypneic at 30-45 cpm. Accessory muscles observed. RT notified.

## 2023-02-22 NOTE — Progress Notes (Signed)
Patient received from ED RN Selinda Flavin. Patient connected to ICU monitoring, atrial fibrillation with heart rate low 100's, SBP 100's. Patient oxygen saturations high 90's on Bipap 50% FiO2. Patient continually asks to have Bipap removed and very forgetful. Education provided on importance of bipap at this time. Husband reports that forgetfullness is baseline for patient since past strokes. He also reports that patient has some residual left sided weakness but this has become hardly noticeable over the years. Patient is alert and oriented but tired, breath sounds very diminished and tachypneic. Pulses are palpable in all extremities and BLE has +2/+3 pitting edema. Foley cath emptied for 350cc of clear, yellow urine from ED.

## 2023-02-22 NOTE — H&P (Addendum)
Cardiology Admission History and Physical   Patient ID: Stacy Jenkins MRN: [id number]; DOB: 1961/06/27   Admission date: 02/22/2023  PCP:  [name], MD   [facility] Cardiologist:  [name], MD   {   Chief Complaint:  dyspnea  Patient Profile:   Stacy Jenkins is a 62 y.o. 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  Patient underwent emergent ascending aortic replacement with valve sparing root replacement after found to have 8.3 cm ascending aorta with dissection in March 2017.  Postop course complicated by brief postop A-fib treated with short course of amiodarone.  Echocardiogram in 2019 showed severe aortic regurgitation with EF down to 35 to 40% with inferior septal akinesis.  Subsequent left and right heart cath at [facility] on 09/11/2018 demonstrated 10% left main, <25% mid LAD, <25% RCA lesion. Patient ultimately underwent bioprosthetic AVR on 11/18/2018 with 21 mm Sorin Perceval rapid deployment sutureless stented bovine pericardial valve.  Preoperative CT did mention a focal outpouching on the medial side of ascending aorta up to 1.2 cm.  She had postoperative atrial fibrillation requiring amiodarone and Eliquis.  She has since been established with Dr. Geraldo Pitter.  More recently, she was admitted at [facility] on 02/03/2021 for was GI bleed, hemoglobin was 4.8.  She required 2 units  of blood transfusion and IV PPI.  Eliquis was temporarily held.  EGD revealed large hiatal hernia but no other bleeding source.  Colonoscopy revealed severe diverticulosis.  Echocardiogram obtained during that admission on 02/03/2023 showed EF 45 to 50%, mild global hypokinesis, moderate to severe MR.  There was no mention of any A-fib issues during the hospitalization.  Since discharge, she has been having increasing shortness of breath and lower extremity edema.  She saw Dr. Geraldo Pitter yesterday on 02/21/2023 and was noted to be in atrial flutter with RVR.  Dr. Geraldo Pitter recommended TEE DCCV which was scheduled today to be done by Dr. Haroldine Laws.  She required TEE as she came off of Eliquis due to recent GI bleed and has only been restarted on Eliquis for the past 2 weeks.  She presented for TEE DCCV today and was in acute respiratory failure.  Rapid response was called and patient was sent to ED for further evaluation.  On exam, patient was severely short of breath and was using of accessory muscles.  Chest x-ray showed moderate pulmonary edema.  O2 saturation 88% on 4 L oxygen.  BNP 878.  Creatinine 0.85.  Initial hemoglobin 8.7, repeat blood work shows hemoglobin 10.2.  Patient has been started on BiPAP in the ED.  IV diltiazem was started for A- flutter with RVR.  Cardiology service called for acute respiratory failure/heart failure in the setting of uncontrolled heart rate.   Past Medical History:  Diagnosis Date   Abnormal echocardiogram 08/20/2018   Added automatically from request for surgery [id number]   Acute blood loss anemia 11/19/2018   Acute hyperglycemia 03/10/2016   Acute post-operative pain 11/19/2018   Aortic valve regurgitation 08/20/2018   Formatting of this  note might be different from the original. Added automatically from request for surgery [id number]   Aphasia    Ascending aortic aneurysm ([place])    ASD (atrial septal defect) 02/03/2023   Benign essential hypertension 08/09/2017   at  goal on current meds  Formatting of this note might be different from the original. at goal on current meds   Cardiomyopathy ([place]) 01/29/2018   Dilated aortic root ([place]) 09/11/2012   Formatting of this note might be different from the original. 4.6 cm   Elevated blood-pressure reading without diagnosis of hypertension 01/29/2018   will monitor  Formatting of this note might be different from the original. will monitor   Expressive aphasia 11/20/2018   Hiatal hernia 01/29/2018   History of atrial fibrillation 11/20/2018   History of CVA (cerebrovascular accident) 08/09/2017   History of migraine 01/29/2018   Hyperlipidemia 01/08/2019   Hypertension 01/08/2019   Hypoglycemia 01/29/2018   dietary info  Formatting of this note might be different from the original. dietary info   Medicare annual wellness visit, subsequent 02/17/2019   Mixed dyslipidemia 08/09/2017   Nonrheumatic aortic valve insufficiency    Other abnormal glucose 01/29/2018   PFO (patent foramen ovale) 09/11/2012   Postoperative atrial fibrillation ([place]) 11/20/2018   S/P ascending aortic aneurysm repair 11/20/2018   S/P AVR 11/19/2018   Stroke (cerebrum) ([place]) - Left MCA branchStroke in a patient with history of prior strokes and mild residual deficits, s/p tPA 07/13/2017   Stroke ([place])    Vitamin D deficiency 01/29/2018    Past Surgical History:  Procedure Laterality Date   aortic valve surgery  02/2016   TEE WITHOUT CARDIOVERSION N/A 07/16/2017   Procedure: TRANSESOPHAGEAL ECHOCARDIOGRAM (TEE);  Surgeon: Sanda Klein, MD;  Location[facility] ENDOSCOPY;  Service: Cardiovascular;  Laterality: N/A;     Medications Prior to Admission: Prior to Admission medications   Medication Sig Start Date End Date Taking? Authorizing Provider  apixaban (ELIQUIS) 5 MG TABS tablet Take 1 tablet (5 mg total) by mouth 2 (two) times daily. 01/21/19   Revankar, Reita Cliche, MD  aspirin EC 81 MG tablet Take 81 mg by mouth daily.    [provider]  dapagliflozin propanediol (FARXIGA) 5 MG TABS tablet Take 1 tablet (5 mg total) by mouth daily before breakfast. 12/19/22   Revankar, Reita Cliche, MD  metoprolol succinate (TOPROL-XL) 50 MG 24 hr tablet Take 50 mg by mouth daily.    [provider]  Multiple Vitamin (MULTIVITAMIN) capsule Take 1 capsule by mouth daily. Unknown strenght    [provider]  Omega-3 Fatty Acids (FISH OIL) 1200 MG CAPS Take 1,200 mg by mouth daily.    [provider]  rosuvastatin (CRESTOR) 5 MG tablet Take 1 tablet by mouth once daily 01/30/23   Revankar, Reita Cliche, MD  sacubitril-valsartan (ENTRESTO) 24-26 MG Take 1 tablet by mouth 2 (two) times daily. 10/03/22   Revankar, Reita Cliche, MD     Allergies:    Allergies  Allergen Reactions   Penicillin G Rash    Pt states she is not allergic    Social History:   Social History   Socioeconomic History   Marital status: Married    Spouse name: Not on file   Number of children: Not on file   Years of education: Not on file   Highest education level: Not on file  Occupational History   Not on file  Tobacco Use   Smoking status: Never   Smokeless tobacco:  Never  Vaping Use   Vaping Use: Never used  Substance and Sexual Activity   Alcohol use: No   Drug use: No   Sexual activity: Not on file  Other Topics Concern   Not on file  Social History Narrative   Not on file   Social Determinants of Health   Financial Resource Strain: Not on file  Food Insecurity: No Food Insecurity (10/05/2022)   Hunger Vital Sign    Worried About Running Out of Food in the Last Year: Never true    Ran Out of Food in the Last Year: Never true  Transportation Needs: No Transportation Needs (10/05/2022)   PRAPARE - Hydrologist (Medical): No    Lack of Transportation (Non-Medical): No  Physical Activity: Not on file  Stress: Not on file  Social Connections: Not on file  Intimate Partner Violence: Not on file     Family History:   The patient's family history includes Heart attack in her father; Heart disease in her brother and father; Hypertension in her mother.    ROS:  Please see the history of present illness.  All other ROS reviewed and negative.     Physical Exam/Data:   Vitals:   02/22/23 1635 02/22/23 1641 02/22/23 1648 02/22/23 1700  BP:  100/78 100/78 112/79  Pulse:  98  83  Resp:  (!) 36  (!) 22  Temp:      SpO2: 91%   99%    Intake/Output Summary (Last 24 hours) at 02/22/2023 1731 Last data filed at 02/22/2023 1657 Gross per 24 hour  Intake 2.86 ml  Output --  Net 2.86 ml      02/22/2023    1:35 PM 02/21/2023   11:21 AM 08/01/2022    3:12 PM  Last 3 Weights  Weight (lbs) 170 lb 176 lb 168 lb 12.8 oz  Weight (kg) 77.111 kg 79.833 kg 76.567 kg     There is no height or weight on file to calculate BMI.  General:  Well nourished, well developed, in no acute distress HEENT: normal Neck: no JVD Vascular: No carotid bruits; Distal pulses 2+ bilaterally   Cardiac:  normal S1, S2; RRR; no murmur  Lungs: Crackles in bilateral bases, diminished breath sounds throughout Abd: soft, nontender, no hepatomegaly  Ext: 3+ edema Musculoskeletal:  No deformities, BUE and BLE strength normal and equal Skin: warm and dry  Neuro:  CNs 2-12 intact, no focal abnormalities noted Psych:  Normal affect    EKG:  The ECG that was done and was personally reviewed and demonstrates atrial fibrillation with RVR  Relevant CV Studies:  Echo 02/03/2023 SUMMARY  LV ejection fraction = 45-50%.  There is mild global hypokinesis of the left ventricle.  Left ventricular filling pattern is indeterminate.  The right ventricle is mildly dilated.  The right ventricular systolic function is normal.  The right atrium is mildly dilated.  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  Dilated IVC with normal collapsibility consistent  with a mildly elevated RA  pressure.  There is no pericardial effusion.   Laboratory Data:  High Sensitivity Troponin:   Recent Labs  Lab 02/22/23 1534  TROPONINIHS 45*      Chemistry Recent Labs  Lab 02/21/23 1203 02/22/23 1534 02/22/23 1552  NA 140 137 138  K 4.2 4.2 4.2  CL 103 105 106  CO2 22 20*  --   GLUCOSE  114* 129* 125*  BUN 22 28* 27*  CREATININE 0.80 0.85 0.70  CALCIUM 9.3 8.8*  --   GFRNONAA  --  >60  --   ANIONGAP  --  12  --     Recent Labs  Lab 02/21/23 1203 02/22/23 1534  PROT 6.7 6.7  ALBUMIN 4.4 3.8  AST 29 39  ALT 38* 43  ALKPHOS 72 59  BILITOT 0.9 1.6*   Lipids No results for input(s): "CHOL", "TRIG", "HDL", "LABVLDL", "LDLCALC", "CHOLHDL" in the last 168 hours. Hematology Recent Labs  Lab 02/21/23 1203 02/22/23 1534 02/22/23 1552  WBC 4.3 4.7  --   RBC 3.75* 3.88  --   HGB 8.4* 8.7* 10.2*  HCT 29.2* 31.1* 30.0*  MCV 78* 80.2  --   MCH 22.4* 22.4*  --   MCHC 28.8* 28.0*  --   RDW 20.9* 23.4*  --   PLT 313 316  --    Thyroid No results for input(s): "TSH", "FREET4" in the last 168 hours. BNP Recent Labs  Lab 02/21/23 1203 02/22/23 1534  BNP  --  876.3*  PROBNP 4,604*  --     DDimer No results for input(s): "DDIMER" in the last 168 hours.   Radiology/Studies:  [facility] Chest Port 1 View  Result Date: 02/22/2023 CLINICAL DATA:  Shortness of breath. EXAM: PORTABLE CHEST 1 VIEW COMPARISON:  02/02/2023. FINDINGS: Moderate pulmonary edema with increased cardiomegaly and small right pleural effusion. No pneumothorax. Unchanged postoperative appearance of the median sternotomy with TAVR in place. Moderate hiatal hernia, unchanged. IMPRESSION: Moderate pulmonary edema with small right pleural effusion. Electronically Signed   By: Emmit Alexanders M.D.   On: 02/22/2023 15:34     Assessment and Plan:   Acute respiratory failure: Secondary to acute on chronic systolic heart failure.  Patient clearly has pulmonary edema on exam and was using  accessory muscle for breathing on 4 L oxygen.  O2 saturation 88%.  She was subsequently started on BiPAP therapy.  Heart rate improved after initiation of BiPAP therapy.  Acute on chronic systolic heart failure: Recent echocardi[facility] at [facility] on 02/03/2023 showed EF 45 to 50%.  In respiratory distress, will give 40 mg IV Lasix.  Will place Foley.  If patient does not put out significant amount of urine in the next hour, will need to give another 40 mg IV Lasix.  Patient has both [place] and [place] on home medication list, however according to the patient, she has not been taking Entresto.  Once fully diuresed, consider tentatively schedule the patient for TEE DCCV for next Monday or Tuesday.  Atrial fibrillation with RVR: Unknown duration.  Eliquis was held during recent hospitalization secondary to GI bleed, patient has been restarted on Eliquis only in the past 2 weeks.  BP does not allow up titration of rate control medication.  DC IV diltiazem which is contraindicated with LV dysfunction.  Started on IV amiodarone for rate control.  Hypertension: Blood pressure borderline, discontinue IV diltiazem given history of LV dysfunction.  Hyperlipidemia: On Crestor  Minimal CAD: Left and right heart cath performed in 2019 showed minimal disease.  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 The patient's score is based upon: CHF  History: 1 HTN History: 1 Diabetes History: 0 Stroke History: 2 Vascular Disease History: 1 Age Score: 0 Gender Score: 1      Severity of Illness: The appropriate patient status for this patient is INPATIENT. Inpatient status is judged to be reasonable and necessary in order to provide the  required intensity of service to ensure the patient's safety. The patient's presenting symptoms, physical exam findings, and initial radiographic and laboratory data in the context of their chronic comorbidities is felt to place them at high risk for further clinical deterioration. Furthermore, it is not anticipated that the patient will be medically stable for discharge from the hospital within 2 midnights of admission.   * I certify that at the point of admission it is my clinical judgment that the patient will require inpatient hospital care spanning beyond 2 midnights from the point of admission due to high intensity of service, high risk for further deterioration and high frequency of surveillance required.*   For questions or updates, please contact [place] Please consult [email or web address] for contact info under     Signed, Almyra Deforest, [place]  02/22/2023 5:31 PM   Patient seen and examined and agree with Almyra Deforest, PA as detailed above.  In brief, the patient is a 62 y.o. 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  Patient with history of aortic dissection s/p emergent ascending aortic replacement with valve sparing root replacement  in March of 2017.  TTE in 2019 showed severe aortic regurgitation with EF down to 35 to 40% with inferior septal akinesis.  Subsequent left and right heart cath at [facility] on 09/11/2018 demonstrated 10% left main, <25% mid LAD, <25% RCA lesion. Patient ultimately underwent bioprosthetic AVR on 11/18/2018 with 21 mm Sorin Perceval rapid deployment sutureless stented bovine pericardial valve. She has history of postop Afib and has been on amiodarone and  apixaban. Had recent admission for GIB thought to be diverticular in nature require aggressive resuscitation for HgB 4.8. Her apixaban was recently restarted. TTE during that admission on 02/03/2023 showed EF 45 to 50%, mild global hypokinesis, moderate to severe MR.  Since discharge, she felt progressively SOB with LE edema. Saw Dr. Geraldo Pitter yesterday and was noted to be in [place] with RVR. She was recommended for TEE/DCCV, however, on presentation she was markedly volume up and in respiratory distress and is now admitted for diuresis and rate control.  In the ER, the patient developed worsening respiratory distress in the setting of significant pulmonary edema. She was placed on BiPAP and lasix was administered. Was also on dilt for rate control which was subsequently changed to amiodarone given reduced EF and soft blood pressures.   Overall, suspect acute decompensation related to significant volume resuscitation during hospitalization with GIB as well as rapid Afib with RVR.  Will continue with aggressive diuresis. Plan to continue amiodarone for rate control given soft blood pressures and reduced EF. Will not start BB at this time due to significant congestion on exam. Once improved from a volume and respiratory status, can plan for TEE/DCCV (has only been on apixaban for 2 weeks).  GEN: Dyspneic, uncomfortable appearing, in clear respiratory distress now on BiPAP   Neck: JVD to the angle of the mandible Cardiac: Irregularly irregular, 2/6 systolic murmur  Respiratory: Diminished at the bases with diffuse crackles GI: Soft, nontender, non-distended  MS: 3+ pitting edema to the thighs Neuro:  Nonfocal  Psych: Normal affect    Plan: -Continue BiPAP -Increase lasix to '80mg'$  IV BID -Change dilt to amiodarone given reduced EF and soft blood pressures -Place foley as patient has not voided since arrival despite the sensation that she needs to void -Continue apixaban for [facility] -Will add GDMT as  tolerated  -Once significantly improved from a volume standpoint, will plan for TEE/DCCV  Gwyndolyn Kaufman, MD

## 2023-02-22 NOTE — Progress Notes (Signed)
Per Dr. Haroldine Laws, call rapid response and have patient transported down to ED. procedure cancelled.

## 2023-02-22 NOTE — ED Provider Notes (Signed)
[facility] Provider Note   CSN: [id number] Arrival date & time: 02/22/23  1434     History  Chief Complaint  Patient presents with   Shortness of Breath    Stacy Jenkins is a 62 y.o. female.  62 year old female with prior medical detailed below presents after rapid response.  Patient was arriving for TEE as scheduled by cardiology.  Patient was noted to be hypoxic, in A-fib with RVR, and fluid overloaded.  Patient was sent to the ED for admission by cardiology.  Patient reports feeling remarkably comfortable on evaluation.  However, she is noted to be both tachycardic and mildly hypoxic.  She denies chest pain, recent fever, or illness.  The history is provided by the patient and medical records.       Home Medications Prior to Admission medications   Medication Sig Start Date End Date Taking? Authorizing Provider  apixaban (ELIQUIS) 5 MG TABS tablet Take 1 tablet (5 mg total) by mouth 2 (two) times daily. 01/21/19   Revankar, Reita Cliche, MD  aspirin EC 81 MG tablet Take 81 mg by mouth daily.    [provider]  dapagliflozin propanediol (FARXIGA) 5 MG TABS tablet Take 1 tablet (5 mg total) by mouth daily before breakfast. 12/19/22   Revankar, Reita Cliche, MD  metoprolol succinate (TOPROL-XL) 50 MG 24 hr tablet Take 50 mg by mouth daily.    [provider]  Multiple Vitamin (MULTIVITAMIN) capsule Take 1 capsule by mouth daily. Unknown strenght    [provider]  Omega-3 Fatty Acids (FISH OIL) 1200 MG CAPS Take 1,200 mg by mouth daily.    [provider]  rosuvastatin (CRESTOR) 5 MG tablet Take 1 tablet by mouth once daily 01/30/23   Revankar, Reita Cliche, MD  sacubitril-valsartan (ENTRESTO) 24-26 MG Take 1 tablet by mouth 2 (two) times daily. 10/03/22   Revankar, Reita Cliche, MD      Allergies    Penicillin g    Review of Systems   Review of Systems  All other systems reviewed and are  negative.   Physical Exam Updated Vital Signs BP 110/82   Pulse (!) 115   Temp 98.6 F (37 C)   Resp (!) 29   SpO2 96%  Physical Exam Vitals and nursing note reviewed.  Constitutional:      General: She is not in acute distress.    Appearance: Normal appearance. She is well-developed.  HENT:     Head: Normocephalic and atraumatic.  Eyes:     Conjunctiva/sclera: Conjunctivae normal.     Pupils: Pupils are equal, round, and reactive to light.  Cardiovascular:     Rate and Rhythm: Normal rate and regular rhythm.     Heart sounds: Normal heart sounds.  Pulmonary:     Effort: Tachypnea present. No respiratory distress.     Comments: Decreased BS at bases  Abdominal:     General: There is no distension.     Palpations: Abdomen is soft.     Tenderness: There is no abdominal tenderness.  Musculoskeletal:        General: No deformity. Normal range of motion.     Cervical back: Normal range of motion and neck supple.     Right lower leg: Edema present.     Left lower leg: Edema present.  Skin:    General: Skin is warm and dry.  Neurological:     General: No focal deficit present.  Mental Status: She is alert and oriented to person, place, and time.     ED Results / Procedures / Treatments   Labs (all labs ordered are listed, but only abnormal results are displayed) Labs Reviewed  CBC WITH DIFFERENTIAL/PLATELET - Abnormal; Notable for the following components:      Result Value   Hemoglobin 8.7 (*)    HCT 31.1 (*)    MCH 22.4 (*)    MCHC 28.0 (*)    RDW 23.4 (*)    All other components within normal limits  I-STAT CHEM 8, ED - Abnormal; Notable for the following components:   BUN 27 (*)    Glucose, Bld 125 (*)    Calcium, Ion 1.08 (*)    Hemoglobin 10.2 (*)    HCT 30.0 (*)    All other components within normal limits  RESP PANEL BY RT-PCR (RSV, FLU A&B, COVID)  [id number]  COMPREHENSIVE METABOLIC PANEL  BRAIN NATRIURETIC PEPTIDE  TROPONIN I (HIGH SENSITIVITY)     EKG EKG Interpretation  Date/Time:  Thursday February 22 2023 15:27:46 EST Ventricular Rate:  139 PR Interval:    QRS Duration: 89 QT Interval:  308 QTC Calculation: 469 R Axis:   67 Text Interpretation: Atrial flutter with varied AV block, Minimal ST depression, lateral leads Confirmed by Dene Gentry [phone]) on 02/22/2023 3:28:51 PM  Radiology DG Chest Port 1 View  Result Date: 02/22/2023 CLINICAL DATA:  Shortness of breath. EXAM: PORTABLE CHEST 1 VIEW COMPARISON:  02/02/2023. FINDINGS: Moderate pulmonary edema with increased cardiomegaly and small right pleural effusion. No pneumothorax. Unchanged postoperative appearance of the median sternotomy with TAVR in place. Moderate hiatal hernia, unchanged. IMPRESSION: Moderate pulmonary edema with small right pleural effusion. Electronically Signed   By: Emmit Alexanders M.D.   On: 02/22/2023 15:34    Procedures Procedures    Medications Ordered in ED Medications  diltiazem (CARDIZEM) 125 mg in dextrose 5% 125 mL (1 mg/mL) infusion (5 mg/hr Intravenous New Bag/Given 02/22/23 1538)    ED Course/ Medical Decision Making/ A&P                             Medical Decision Making Amount and/or Complexity of Data Reviewed Labs: ordered. Radiology: ordered.  Risk Prescription drug management. Decision regarding hospitalization.    Medical Screen Complete  This patient presented to the ED with complaint of shortness of breath, A-fib with RVR.  This complaint involves an extensive number of treatment options. The initial differential diagnosis includes, but is not limited to, ACS, metabolic abnormality, A-fib with RVR, CHF exacerbation, etc.  This presentation is: Acute, Chronic, Self-Limited, Previously Undiagnosed, Uncertain Prognosis, Complicated, Systemic Symptoms, and Threat to Life/Bodily Function  Patient sent to ED for admission by cardiology after presenting for scheduled TEE and being noted to be both hypoxic and  tachycardic.  Patient with presentation consistent with fluid overload and A-fib with RVR.  Cardiology is aware of case and plans to admit patient.  Screening labs ordered.  A-fib with RVR noted - Cardizem drip initiated for rate control.   Additional history obtained:  External records from outside sources obtained and reviewed including prior ED visits and prior Inpatient records.    Lab Tests:  I ordered and personally interpreted labs.  The pertinent results include: CBC, i-STAT Chem-8, BNP, CMP, troponin, COVID, flu   Imaging Studies ordered:  I ordered imaging studies including chest x-ray I independently visualized and interpreted obtained imaging  which showed fluid overload I agree with the radiologist interpretation.   Cardiac Monitoring:  The patient was maintained on a cardiac monitor.  I personally viewed and interpreted the cardiac monitor which showed an underlying rhythm of: A-fib with RVR   Medicines ordered:  I ordered medication including Cardizem drip for A-fib with RVR Reevaluation of the patient after these medicines showed that the patient: improved  Problem List / ED Course:  Dyspnea, A-fib with RVR   Reevaluation:  After the interventions noted above, I reevaluated the patient and found that they have: stayed the same Disposition:  After consideration of the diagnostic results and the patients response to treatment, I feel that the patent would benefit from admission.   CRITICAL CARE Performed by: Valarie Merino   Total critical care time: 30 minutes  Critical care time was exclusive of separately billable procedures and treating other patients.  Critical care was necessary to treat or prevent imminent or life-threatening deterioration.  Critical care was time spent personally by me on the following activities: development of treatment plan with patient and/or surrogate as well as nursing, discussions with consultants, evaluation of  patient's response to treatment, examination of patient, obtaining history from patient or surrogate, ordering and performing treatments and interventions, ordering and review of laboratory studies, ordering and review of radiographic studies, pulse oximetry and re-evaluation of patient's condition.          Final Clinical Impression(s) / ED Diagnoses Final diagnoses:  Dyspnea, unspecified type  Atrial fibrillation, unspecified type [facility])    Rx / DC Orders ED Discharge Orders     None         Valarie Merino, MD 02/22/23 [phone]

## 2023-02-22 NOTE — ED Triage Notes (Signed)
Pt arrived for scheduled TEE today by advisement of cardiology and was brought to ED via rapid response nurse for eval of shortness of breath and tachycardia prior to procedure. Patient denies complaints. BLE pitting edema, hypoxic in endo on their admission vitals.

## 2023-02-22 NOTE — ED Notes (Signed)
Pt called for room. Pt not found at this time.

## 2023-02-22 NOTE — Anesthesia Preprocedure Evaluation (Deleted)
Anesthesia Evaluation  Patient identified by MRN, date of birth, ID band Patient awake    Reviewed: Allergy & Precautions, H&P , NPO status , Patient's Chart, lab work & pertinent test results  Airway Mallampati: II   Neck ROM: full    Dental   Pulmonary neg pulmonary ROS   breath sounds clear to auscultation       Cardiovascular hypertension, + dysrhythmias Atrial Fibrillation + Valvular Problems/Murmurs  Rhythm:irregular Rate:Normal  S/p AVR and ascending aortic aneurysm repair 2019.   Neuro/Psych CVA    GI/Hepatic hiatal hernia,,,  Endo/Other    Renal/GU      Musculoskeletal   Abdominal   Peds  Hematology   Anesthesia Other Findings   Reproductive/Obstetrics                             Anesthesia Physical Anesthesia Plan  ASA: 3  Anesthesia Plan: MAC   Post-op Pain Management:    Induction: Intravenous  PONV Risk Score and Plan: 2 and Propofol infusion and Treatment may vary due to age or medical condition  Airway Management Planned: Nasal Cannula  Additional Equipment:   Intra-op Plan:   Post-operative Plan:   Informed Consent: I have reviewed the patients History and Physical, chart, labs and discussed the procedure including the risks, benefits and alternatives for the proposed anesthesia with the patient or authorized representative who has indicated his/her understanding and acceptance.     Dental advisory given  Plan Discussed with: CRNA, Anesthesiologist and Surgeon  Anesthesia Plan Comments:        Anesthesia Quick Evaluation

## 2023-02-23 DIAGNOSIS — I4819 Other persistent atrial fibrillation: Secondary | ICD-10-CM | POA: Diagnosis not present

## 2023-02-23 DIAGNOSIS — I5023 Acute on chronic systolic (congestive) heart failure: Secondary | ICD-10-CM | POA: Diagnosis not present

## 2023-02-23 DIAGNOSIS — Z952 Presence of prosthetic heart valve: Secondary | ICD-10-CM

## 2023-02-23 DIAGNOSIS — D6869 Other thrombophilia: Secondary | ICD-10-CM | POA: Diagnosis not present

## 2023-02-23 LAB — BASIC METABOLIC PANEL
Anion gap: 11 (ref 5–15)
BUN: 32 mg/dL — ABNORMAL HIGH (ref 8–23)
CO2: 21 mmol/L — ABNORMAL LOW (ref 22–32)
Calcium: 8.4 mg/dL — ABNORMAL LOW (ref 8.9–10.3)
Chloride: 104 mmol/L (ref 98–111)
Creatinine, Ser: 0.99 mg/dL (ref 0.44–1.00)
GFR, Estimated: >60 mL/min (ref >60)
Glucose, Bld: 129 mg/dL — ABNORMAL HIGH (ref 70–99)
Potassium: 3.6 mmol/L (ref 3.5–5.1)
Sodium: 136 mmol/L (ref 135–145)

## 2023-02-23 LAB — MRSA NEXT GEN BY PCR, NASAL: MRSA by PCR Next Gen: NOT DETECTED

## 2023-02-23 MED ORDER — CHLORHEXIDINE GLUCONATE CLOTH 2 % EX PADS
6.0000 | MEDICATED_PAD | Freq: Every day | CUTANEOUS | Status: DC
  Administered 2023-02-23 – 2023-03-08 (×14): 6 via TOPICAL

## 2023-02-23 MED ORDER — TRAZODONE HCL 50 MG PO TABS
50.0000 mg | ORAL_TABLET | Freq: Every evening | ORAL | Status: DC | PRN
  Administered 2023-02-23 – 2023-03-07 (×5): 50 mg via ORAL
  Filled 2023-02-23 (×5): qty 1

## 2023-02-23 MED ORDER — LORAZEPAM 2 MG/ML IJ SOLN
0.5000 mg | Freq: Two times a day (BID) | INTRAMUSCULAR | Status: DC | PRN
  Administered 2023-02-23 – 2023-02-26 (×4): 0.5 mg via INTRAVENOUS
  Filled 2023-02-23 (×4): qty 1

## 2023-02-23 NOTE — Progress Notes (Signed)
Rounding Note    Patient Name: Stacy Jenkins Date of Encounter: 02/23/2023  [place] Cardiologist: [name], MD   Subjective   Confused last night and pulled off BiPAP. Was given ativan and slept well afterwards.States she feels better today. Still a little drowsy this morning.   Responding well to lasix '80mg'$  IV BID HR better controlled on amiodarone gtt  Inpatient Medications    Scheduled Meds:  apixaban  5 mg Oral BID   Chlorhexidine Gluconate Cloth  6 each Topical Daily   furosemide  80 mg Intravenous BID   mouth rinse  15 mL Mouth Rinse 4 times per day   potassium chloride  20 mEq Oral BID   rosuvastatin  5 mg Oral Daily   sodium chloride flush  3 mL Intravenous Q12H   Continuous Infusions:  sodium chloride     amiodarone 30 mg/hr (02/23/23 0800)   PRN Meds: sodium chloride, acetaminophen, LORazepam, ondansetron (ZOFRAN) IV, mouth rinse, sodium chloride flush, traZODone   Vital Signs    Vitals:   02/23/23 0600 02/23/23 0700 02/23/23 0800 02/23/23 0814  BP: 100/82 101/80 96/63   Pulse: 90 91 94 (!) 104  Resp: 16 (!) 42 16 (!) 24  Temp:   (!) 97.4 F (36.3 C)   TempSrc:   Oral   SpO2: 98% 98% 97% 93%  Weight:        Intake/Output Summary (Last 24 hours) at 02/23/2023 0920 Last data filed at 02/23/2023 0800 Gross per 24 hour  Intake 423.7 ml  Output 1310 ml  Net -886.3 ml      02/23/2023    4:44 AM 02/22/2023    6:32 PM 02/22/2023    1:35 PM  Last 3 Weights  Weight (lbs) 174 lb 9.7 oz 174 lb 13.2 oz 170 lb  Weight (kg) 79.2 kg 79.3 kg 77.111 kg      Telemetry    Afib with PVCs with HR mainly 90s - Personally Reviewed  ECG    No new tracing today - Personally Reviewed  Physical Exam   GEN: Sitting up in bed, comfortable Neck: JVD to the ear Cardiac: Irregular, 2/6 systolic murmur Respiratory: Bibasilar crackles GI: Soft, nontender, non-distended  MS: 2+ LE pitting edema to the knees Neuro:  Nonfocal  Psych: Normal  affect   Labs    High Sensitivity Troponin:   Recent Labs  Lab 02/22/23 1534 02/22/23 1701  TROPONINIHS 45* 44*     Chemistry Recent Labs  Lab 02/21/23 1203 02/22/23 1534 02/22/23 1552 02/23/23 0623  NA 140 137 138 136  K 4.2 4.2 4.2 3.6  CL 103 105 106 104  CO2 22 20*  --  21*  GLUCOSE 114* 129* 125* 129*  BUN 22 28* 27* 32*  CREATININE 0.80 0.85 0.70 0.99  CALCIUM 9.3 8.8*  --  8.4*  PROT 6.7 6.7  --   --   ALBUMIN 4.4 3.8  --   --   AST 29 39  --   --   ALT 38* 43  --   --   ALKPHOS 72 59  --   --   BILITOT 0.9 1.6*  --   --   GFRNONAA  --  >60  --  >60  ANIONGAP  --  12  --  11    Lipids No results for input(s): "CHOL", "TRIG", "HDL", "LABVLDL", "LDLCALC", "CHOLHDL" in the last 168 hours.  Hematology Recent Labs  Lab 02/21/23 1203 02/22/23 1534 02/22/23  1552  WBC 4.3 4.7  --   RBC 3.75* 3.88  --   HGB 8.4* 8.7* 10.2*  HCT 29.2* 31.1* 30.0*  MCV 78* 80.2  --   MCH 22.4* 22.4*  --   MCHC 28.8* 28.0*  --   RDW 20.9* 23.4*  --   PLT 313 316  --    Thyroid No results for input(s): "TSH", "FREET4" in the last 168 hours.  BNP Recent Labs  Lab 02/21/23 1203 02/22/23 1534  BNP  --  876.3*  PROBNP 4,604*  --     DDimer No results for input(s): "DDIMER" in the last 168 hours.   Radiology    DG Chest Port 1 View  Result Date: 02/22/2023 CLINICAL DATA:  Shortness of breath. EXAM: PORTABLE CHEST 1 VIEW COMPARISON:  02/02/2023. FINDINGS: Moderate pulmonary edema with increased cardiomegaly and small right pleural effusion. No pneumothorax. Unchanged postoperative appearance of the median sternotomy with TAVR in place. Moderate hiatal hernia, unchanged. IMPRESSION: Moderate pulmonary edema with small right pleural effusion. Electronically Signed   By: Emmit Alexanders M.D.   On: 02/22/2023 15:34    Cardiac Studies   Echo 02/03/2023 SUMMARY  LV ejection fraction = 45-50%.  There is mild global hypokinesis of the left ventricle.  Left ventricular filling  pattern is indeterminate.  The right ventricle is mildly dilated.  The right ventricular systolic function is normal.  The right atrium is mildly dilated.  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  Dilated IVC with normal collapsibility consistent with a mildly elevated RA  pressure.  There is no pericardial effusion.   Patient Profile     62 y.o. 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   Assessment & Plan    #Acute on Chronic Combined Systolic and Diastolic HF Exacerbation: #Acute Hypoxic Respiratory Failure Patient presented for outpatient TEE/DCCV found to be grossly volume overloaded in respiratory distress prompting admission for further management. Most recent TTE 02/03/23 with EF 45 to 50%, mild global hypokinesis, moderate to severe MR. Suspect acute decompensation related to recent volume resuscitation for GIB in 01/2023 as well as rapid Afib with RVR. Was placed on BiPAP and started on IV diuresis with significant improvement. Will continue today. -Weaned off BiPAP and now on [place] -Continue lasix '80mg'$  IV BID; responding well -Continue potassium replacement -Well add GDMT as able; BP too soft to add now -Monitor I/Os and daily weights  #Afib with RVR: CHADs-vasc 6. Patient with known Afib who presented with Afib with RVR. TEE/DCCV deferred due to severe volume overload as detailed above. Currently on amiodarone gtt with improvement in HR. Will plan for TEE/DCCV once more euvolemic. -Continue amiodarone gtt -Continue apxiaban for [facility] -Dilt stopped due to reduced EF and soft blood  pressure -Holding nodal agents for now given degree of volume overload -Will plan for TEE/DCCV once volume status improved  #History of severe AR s/p AVR in 2019: TTE 01/2023 with normal functioning AV prosthesis.   #History of Ascending Aortic Dissection s/p repair: -Continue serial monitoring as outpatient  #Minimal CAD:  Noted on cath in 2019. Continue medical management  #HTN: Now with soft blood pressures. Will add GDMT as tolerated pending continued  clinical improvement  CRITICAL CARE TIME: I have spent a total of 45 minutes with patient reviewing hospital notes, telemetry, EKGs, labs and examining the patient as well as establishing an assessment and plan that was discussed with the patient.  > 50% of time was spent in direct patient care. The patient is critically ill with multi-organ system failure and requires high complexity decision making for assessment and support, frequent evaluation and titration of therapies, application of advanced monitoring technologies and extensive interpretation of multiple databases.   For questions or updates, please contact [place] Please consult [email or web address] for contact info under        Signed, Freada Bergeron, MD  02/23/2023, 9:20 AM

## 2023-02-23 NOTE — TOC Initial Note (Signed)
Transition of Care [facility]) - Initial/Assessment Note    Patient Details  Name: Stacy Jenkins MRN: [id number] Date of Birth: 1961/01/20  Transition of Care [facility]) CM/SW Contact:    Erenest Rasher, RN Phone Number: [phone] 02/23/2023, 5:21 PM  Clinical Narrative:                 CM spoke to pt and husband at bedside. Husband states pt was independent PTA. Will continue to follow for dc needs.   Expected Discharge Plan: Skilled Nursing Facility Barriers to Discharge: Continued Medical Work up   Patient Goals and CMS Choice Patient states their goals for this hospitalization and ongoing recovery are:: wants pt to recover CMS Medicare.gov Compare Post Acute Care list provided to:: Patient Represenative (must comment)        Expected Discharge Plan and Services   Discharge Planning Services: CM Consult   Living arrangements for the past 2 months: Single Family Home                                      Prior Living Arrangements/Services Living arrangements for the past 2 months: Single Family Home Lives with:: Spouse          Need for Family Participation in Patient Care: Yes (Comment) Care giver support system in place?: Yes (comment)   Criminal Activity/Legal Involvement Pertinent to Current Situation/Hospitalization: No - Comment as needed  Activities of Daily Living      Permission Sought/Granted Permission sought to share information with : Case Manager, Family Supports                Emotional Assessment Appearance:: Appears stated age Attitude/Demeanor/Rapport: Lethargic Affect (typically observed): Accepting Orientation: : Oriented to Self      Admission diagnosis:  Acute on chronic systolic heart failure (HCC) [I50.23] Atrial fibrillation, unspecified type ([place]) [I48.91] Dyspnea, unspecified type [R06.00] Patient Active Problem List   Diagnosis Date Noted   Acute on chronic systolic heart failure ([place]) 02/22/2023   ASD (atrial  septal defect) 02/03/2023   Nonrheumatic aortic valve insufficiency    Stroke [facility])    Medicare annual wellness visit, subsequent 02/17/2019   Hyperlipidemia 01/08/2019   Hypertension 01/08/2019   Expressive aphasia 11/20/2018   History of atrial fibrillation 11/20/2018   Postoperative atrial fibrillation ([place]) 11/20/2018   S/P ascending aortic aneurysm repair 11/20/2018   Acute blood loss anemia 11/19/2018   Acute post-operative pain 11/19/2018   S/P AVR 11/19/2018   Aortic valve regurgitation 08/20/2018   Abnormal echocardiogram 08/20/2018   Cardiomyopathy ([place]) 01/29/2018   Elevated blood-pressure reading without diagnosis of hypertension 01/29/2018   Hiatal hernia 01/29/2018   History of migraine 01/29/2018   Hypoglycemia 01/29/2018   Other abnormal glucose 01/29/2018   Vitamin D deficiency 01/29/2018   Benign essential hypertension 08/09/2017   History of CVA (cerebrovascular accident) 08/09/2017   Mixed dyslipidemia 08/09/2017   Ascending aortic aneurysm ([place])    Stroke (cerebrum) ([place]) - Left MCA branchStroke in a patient with history of prior strokes and mild residual deficits, s/p tPA 07/13/2017   Acute hyperglycemia 03/10/2016   PFO (patent foramen ovale) 09/11/2012   Dilated aortic root ([place]) 09/11/2012   PCP:  [name], MD Pharmacy:   [facility] [address] [place] [address] Phone: [phone] Fax: [phone]  [facility] [address] [place] [address] Phone: [phone] Fax: [phone]     Social Determinants of Health (SDOH) Social History: [place]: No Food Insecurity (10/05/2022)  Housing: Low Risk  (10/05/2022)  Transportation Needs: No Transportation Needs (10/05/2022)  Utilities: Not At Risk (10/05/2022)  Tobacco Use: Low Risk  (02/22/2023)   SDOH Interventions:     Readmission Risk Interventions     No  data to display

## 2023-02-23 NOTE — Progress Notes (Signed)
Heart Failure Navigator Progress Note  Following this hospitalization to assess for HV TOC readiness.   EF 50-55 % (07/2022) Patient currently on IV Amiodarone drip.   Earnestine Leys, BSN, Clinical cytogeneticist Only

## 2023-02-23 NOTE — Progress Notes (Signed)
Orthopedic Tech Progress Note Patient Details:  Stacy Jenkins 02-14-1961 [id number]  Ortho Devices Type of Ortho Device: Haematologist Ortho Device/Splint Location: BLE Ortho Device/Splint Interventions: Ordered, Adjustment, Application   Post Interventions Patient Tolerated: Well Instructions Provided: Care of device  [place] 02/23/2023, 4:33 PM

## 2023-02-23 NOTE — Progress Notes (Addendum)
   Heart Failure Stewardship Pharmacist Progress Note   PCP: [name], MD PCP-Cardiologist: [name], MD    HPI:  62 yo F with PMH of HTN, CAD, HLD, prior CVA, and CHF.   She presented to the hosptial on 2/29 for elective TEE/DCCV but was noted to be hypoxic, in afib with RVR, and fluid overloaded. She was sent to the ED for evaluation. CXR with moderate pulmonary edema with small R pleural effusion. Most recent TTE 02/03/23 with EF 45 to 50%, mild global hypokinesis, moderate to severe MR. Started on bipap and IV lasix with improvement in symptoms. Planning for TEE/DCCV once volume status improved.  Current HF Medications: Diuretic: furosemide 80 mg IV BID  Prior to admission HF Medications: ACE/ARB/ARNI: Entresto 24/26 mg BID (recently stopped) SGLT2i: Farxiga 5 mg daily  Pertinent Lab Values: Serum creatinine 0.99, BUN 32, Potassium 3.6, Sodium 136, BNP 876.3 Vital Signs: Weight: 174 lbs (admission weight: 174 lbs) Blood pressure: 90-100/70s  Heart rate: 110s  I/O: -0.6L yesterday; net -2.6L  Medication Assistance / Insurance Benefits Check: Does the patient have prescription insurance?  Yes Type of insurance plan: [employer] Medicare  Does the patient qualify for medication assistance through manufacturers or grants?   Pending Eligible grants and/or patient assistance programs: pending Medication assistance applications in progress: none  Medication assistance applications approved: none Approved medication assistance renewals will be completed by: pending  Outpatient Pharmacy:  Prior to admission outpatient pharmacy: [facility] Is the patient willing to use [facility] pharmacy at discharge? Yes Is the patient willing to transition their outpatient pharmacy to utilize a [facility] outpatient pharmacy?   Pending    Assessment: 1. Acute on chronic diastolic CHF (LVEF [id number]). NYHA class II symptoms. - Continue furosemide 80 mg IV BID. Strict I/Os  and daily weights. Keep K>4 and Mg>2. - Consider restarting Farxiga at 10 mg daily prior to discharge  - Consider MRA and Entresto once BP improves   Plan: 1) Medication changes recommended at this time: - Start Farxiga 10 mg daily  2) Patient assistance: - None pending  3)  Education  - To be completed prior to discharge  Kerby Nora, PharmD, BCPS Heart Failure Cytogeneticist Phone [phone]

## 2023-02-24 DIAGNOSIS — J810 Acute pulmonary edema: Secondary | ICD-10-CM

## 2023-02-24 DIAGNOSIS — E876 Hypokalemia: Secondary | ICD-10-CM

## 2023-02-24 DIAGNOSIS — Z8679 Personal history of other diseases of the circulatory system: Secondary | ICD-10-CM

## 2023-02-24 DIAGNOSIS — I340 Nonrheumatic mitral (valve) insufficiency: Secondary | ICD-10-CM

## 2023-02-24 DIAGNOSIS — R570 Cardiogenic shock: Secondary | ICD-10-CM | POA: Diagnosis not present

## 2023-02-24 DIAGNOSIS — I484 Atypical atrial flutter: Secondary | ICD-10-CM

## 2023-02-24 DIAGNOSIS — I5023 Acute on chronic systolic (congestive) heart failure: Secondary | ICD-10-CM | POA: Diagnosis not present

## 2023-02-24 DIAGNOSIS — Z953 Presence of xenogenic heart valve: Secondary | ICD-10-CM

## 2023-02-24 DIAGNOSIS — I2510 Atherosclerotic heart disease of native coronary artery without angina pectoris: Secondary | ICD-10-CM

## 2023-02-24 LAB — BASIC METABOLIC PANEL
Anion gap: 13 (ref 5–15)
Anion gap: 12 (ref 5–15)
BUN: 30 mg/dL — ABNORMAL HIGH (ref 8–23)
BUN: 34 mg/dL — ABNORMAL HIGH (ref 8–23)
CO2: 25 mmol/L (ref 22–32)
CO2: 25 mmol/L (ref 22–32)
Calcium: 8.2 mg/dL — ABNORMAL LOW (ref 8.9–10.3)
Calcium: 8.4 mg/dL — ABNORMAL LOW (ref 8.9–10.3)
Chloride: 97 mmol/L — ABNORMAL LOW (ref 98–111)
Chloride: 94 mmol/L — ABNORMAL LOW (ref 98–111)
Creatinine, Ser: 0.95 mg/dL (ref 0.44–1.00)
Creatinine, Ser: 1.27 mg/dL — ABNORMAL HIGH (ref 0.44–1.00)
GFR, Estimated: >60 mL/min (ref >60)
GFR, Estimated: 48 mL/min — ABNORMAL LOW (ref >60)
Glucose, Bld: 131 mg/dL — ABNORMAL HIGH (ref 70–99)
Glucose, Bld: 167 mg/dL — ABNORMAL HIGH (ref 70–99)
Potassium: 2.7 mmol/L — CL (ref 3.5–5.1)
Potassium: 3.8 mmol/L (ref 3.5–5.1)
Sodium: 135 mmol/L (ref 135–145)
Sodium: 131 mmol/L — ABNORMAL LOW (ref 135–145)

## 2023-02-24 LAB — MAGNESIUM
Magnesium: 1.9 mg/dL (ref 1.7–2.4)
Magnesium: 2.1 mg/dL (ref 1.7–2.4)

## 2023-02-24 LAB — POTASSIUM: Potassium: 3.6 mmol/L (ref 3.5–5.1)

## 2023-02-24 MED ORDER — POTASSIUM CHLORIDE 10 MEQ/100ML IV SOLN
10.0000 meq | INTRAVENOUS | Status: AC
  Administered 2023-02-24: 10 meq via INTRAVENOUS
  Filled 2023-02-24 (×2): qty 100

## 2023-02-24 MED ORDER — AMIODARONE HCL IN DEXTROSE 360-4.14 MG/200ML-% IV SOLN: 60.0000 mg/h | INTRAVENOUS | Status: DC

## 2023-02-24 MED ORDER — POTASSIUM CHLORIDE 10 MEQ/50ML IV SOLN: 10.0000 meq | INTRAVENOUS | Status: DC

## 2023-02-24 MED ORDER — AMIODARONE HCL IN DEXTROSE 360-4.14 MG/200ML-% IV SOLN
60.0000 mg/h | INTRAVENOUS | Status: AC
  Administered 2023-02-24 (×2): 60 mg/h via INTRAVENOUS
  Filled 2023-02-24 (×2): qty 200

## 2023-02-24 MED ORDER — AMIODARONE LOAD VIA INFUSION
150.0000 mg | Freq: Once | INTRAVENOUS | Status: AC
  Administered 2023-02-24: 150 mg via INTRAVENOUS
  Filled 2023-02-24: qty 83.34

## 2023-02-24 MED ORDER — AMIODARONE HCL IN DEXTROSE 360-4.14 MG/200ML-% IV SOLN
30.0000 mg/h | INTRAVENOUS | Status: DC
  Administered 2023-02-25 (×2): 30 mg/h via INTRAVENOUS
  Administered 2023-02-26: 60 mg/h via INTRAVENOUS
  Administered 2023-02-26: 30 mg/h via INTRAVENOUS
  Administered 2023-02-26 – 2023-02-27 (×2): 60 mg/h via INTRAVENOUS
  Administered 2023-02-27: 30 mg/h via INTRAVENOUS
  Administered 2023-02-28 (×3): 60 mg/h via INTRAVENOUS
  Administered 2023-02-28: 30 mg/h via INTRAVENOUS
  Administered 2023-03-01: 60 mg/h via INTRAVENOUS
  Administered 2023-03-01: 30 mg/h via INTRAVENOUS
  Administered 2023-03-01 – 2023-03-02 (×2): 60 mg/h via INTRAVENOUS
  Administered 2023-03-02: 30 mg/h via INTRAVENOUS
  Administered 2023-03-02: 60 mg/h via INTRAVENOUS
  Administered 2023-03-03: 30 mg/h via INTRAVENOUS
  Filled 2023-02-24 (×18): qty 200

## 2023-02-24 MED ORDER — POTASSIUM CHLORIDE 10 MEQ/100ML IV SOLN
10.0000 meq | INTRAVENOUS | Status: AC
  Administered 2023-02-24 (×2): 10 meq via INTRAVENOUS
  Filled 2023-02-24 (×2): qty 100

## 2023-02-24 NOTE — Progress Notes (Addendum)
Paged returned verbal order received to infuse 20 mEq via PIV due to potential aspiration of oral dose given earlier in the shift.

## 2023-02-24 NOTE — Progress Notes (Signed)
Patient confused and needing frequent safety reminders throughout shift. Patient attempting to get out of bed multiple times requiring redirection. Patient toileted multiple times and out of bed to the chair. Safety alarms in place. Patient reports being "independent" at home. Patient also reports that husband cares for her at home. Husband at bedside throughout afternoon. Noted to be incontinent of urine, soaking through pants. Refuses new clothes provided by RN. Around 1700, husband with tear streaked face, pale and ill appearing. Husband states he is going home because he has "something in his blood that no doctor can figure out." This RN states multiple times to go to the ED and offers to take patient to the ED. Patient husband orientedx4 and refusing being taken to ED for assessment stating "we've already done that." Husband reports that his mother had cyclical vomiting syndrome and that this happens to him every week. Husband walked independently out of the unit stating he was heading home. Patient reports that husband is able to take her to her appointments but there is no other support system at home. Patient has son but she reports that he is not involved in their life. No neighbors or friends check on patient and her husband. Difficult to assess home life and safety due to patient's previous strokes and seemingly reduced mentation and orientation. Social work consult placed for assessment of patient home safety and care provision.

## 2023-02-24 NOTE — Evaluation (Signed)
Clinical/Bedside Swallow Evaluation Patient Details  Name: Stacy Jenkins MRN: [id number] Date of Birth: 1961-01-30  Today's Date: 02/24/2023 Time: SLP Start Time (ACUTE ONLY): 1042 SLP Stop Time (ACUTE ONLY): [id number] SLP Time Calculation (min) (ACUTE ONLY): 10 min  Past Medical History:  Past Medical History:  Diagnosis Date   Abnormal echocardiogram 08/20/2018   Added automatically from request for surgery [id number]   Acute blood loss anemia 11/19/2018   Acute hyperglycemia 03/10/2016   Acute post-operative pain 11/19/2018   Aortic valve regurgitation 08/20/2018   Formatting of this note might be different from the original. Added automatically from request for surgery [id number]   Aphasia    Ascending aortic aneurysm ([place])    ASD (atrial septal defect) 02/03/2023   Benign essential hypertension 08/09/2017   at goal on current meds  Formatting of this note might be different from the original. at goal on current meds   Cardiomyopathy ([place]) 01/29/2018   Dilated aortic root ([place]) 09/11/2012   Formatting of this note might be different from the original. 4.6 cm   Elevated blood-pressure reading without diagnosis of hypertension 01/29/2018   will monitor  Formatting of this note might be different from the original. will monitor   Expressive aphasia 11/20/2018   Hiatal hernia 01/29/2018   History of atrial fibrillation 11/20/2018   History of CVA (cerebrovascular accident) 08/09/2017   History of migraine 01/29/2018   Hyperlipidemia 01/08/2019   Hypertension 01/08/2019   Hypoglycemia 01/29/2018   dietary info  Formatting of this note might be different from the original. dietary info   Medicare annual wellness visit, subsequent 02/17/2019   Mixed dyslipidemia 08/09/2017   Nonrheumatic aortic valve insufficiency    Other abnormal glucose 01/29/2018   PFO (patent foramen ovale) 09/11/2012   Postoperative atrial fibrillation ([place]) 11/20/2018   S/P ascending aortic aneurysm repair  11/20/2018   S/P AVR 11/19/2018   Stroke (cerebrum) ([place]) - Left MCA branchStroke in a patient with history of prior strokes and mild residual deficits, s/p tPA 07/13/2017   Stroke ([place])    Vitamin D deficiency 01/29/2018   Past Surgical History:  Past Surgical History:  Procedure Laterality Date   aortic valve surgery  02/2016   TEE WITHOUT CARDIOVERSION N/A 07/16/2017   Procedure: TRANSESOPHAGEAL ECHOCARDIOGRAM (TEE);  Surgeon: Sanda Klein, MD;  Location: [facility];  Service: Cardiovascular;  Laterality: N/A;   HPI:  Stacy Jenkins is a 62 y.o. 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  She was diagnosed with acute respiratory, acute on chronic systolic heart failure and afib with RVR.  Chest xray was showing moderate pulmonary edema with small right pleural effusion.    Assessment / Plan / Recommendation  Clinical Impression  Clinical swallowing evaluation was completed using thin liquids via spoon, cup and straw, pureed material and dry solids. RN approved patient for PO intake and reported intermittent coughing with intake of fluids.  The patient did not endorse any issues swallowing.  Limited cranial nerve exam was completed.  Lingual, labial, facial and jaw range of motion appeared adequate.  Strength and sensation were unable to be assessed due to her difficulty following all commands.  She presented with a possible oral and pharygneal dysphagia as well as a possible esophageal dysphagia. Mastication of dry solids was slow, however, she was able to orally  clear all material.  Swallow trigger was appreciated to palpation.  Patient had a delayed cough x1 following many consecutive sips of thin liquids via straw.  She was also noted to  belch throughout PO trials raising suspicion for a possible esophageal dysphagia.  Given patient with acute respiratory failure MBS will be completed to fully assess swallowing physiology and safety.  Patient may continue current diet pending MBS.  RN made aware of these findings. SLP Visit Diagnosis: Dysphagia, unspecified (R13.10)    Aspiration Risk  Mild aspiration risk    Diet Recommendation   Regular/thin pending results of MBS  Medication Administration: Whole meds with liquid    Other  Recommendations Oral Care Recommendations: Oral care BID    Recommendations for follow up therapy are one component of a multi-disciplinary discharge planning process, led by the attending physician.  Recommendations may be updated based on patient status, additional functional criteria and insurance authorization.  Follow up Recommendations   TBD       Swallow Study   General Date of Onset: 02/22/23 HPI: Stacy Jenkins is a 62 y.o. 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  She was diagnosed with acute respiratory, acute on chronic systolic heart failure and afib with RVR.  Chest xray was showing moderate pulmonary edema with small right pleural effusion. Type of Study: Bedside Swallow Evaluation Previous Swallow Assessment: None noted at [facility]. Diet Prior to this Study: Regular;Thin liquids (Level 0) Temperature Spikes Noted: No Respiratory Status: Nasal cannula History of Recent Intubation: No Behavior/Cognition: Alert;Cooperative;Pleasant mood;Confused;Requires cueing Oral Cavity Assessment: Within Functional Limits Oral Care Completed by SLP: No Oral Cavity - Dentition: Adequate natural dentition Vision: Functional for self-feeding Self-Feeding  Abilities: Able to feed self Patient Positioning: Upright in chair Baseline Vocal Quality: Normal Volitional Swallow: Unable to elicit    Oral/Motor/Sensory Function Overall Oral Motor/Sensory Function: Within functional limits   Ice Chips Ice chips: Not tested   Thin Liquid Thin Liquid: Impaired Presentation: Cup;Self Fed;Straw;Spoon Pharyngeal  Phase Impairments: Cough - Delayed (after serial sips of thin liquids)    Nectar Thick Nectar Thick Liquid: Not tested   Honey Thick Honey Thick Liquid: Not tested   Puree Puree: Within functional limits Presentation: Spoon   Solid     Solid: Impaired Presentation: Self Fed Oral Phase Impairments: Impaired mastication Oral Phase Functional Implications: Prolonged oral transit      Shelly Flatten, MA, CCC-SLP Acute Rehab SLP [phone]  Lamar Sprinkles 02/24/2023,11:02 AM

## 2023-02-24 NOTE — Progress Notes (Signed)
Cardiology fellow paged to inform them of critical potassium of 2.7. Waiting for page to be returned at this time.

## 2023-02-24 NOTE — Progress Notes (Signed)
Rounding Note    Patient Name: Stacy Jenkins Date of Encounter: 02/24/2023  [facility] Cardiologist: [name], MD   Subjective   Restless, sitting straight up in bed.  Slightly disoriented. Concerned that she was unable to swallow medications safely. Excellent diuresis with a net 3.7 L fluid removal in the last 2 days. Severely hypokalemic. Remains in atrial fibrillation with fair ventricular rate control on intravenous amiodarone.  Inpatient Medications    Scheduled Meds:  apixaban  5 mg Oral BID   Chlorhexidine Gluconate Cloth  6 each Topical Daily   furosemide  80 mg Intravenous BID   mouth rinse  15 mL Mouth Rinse 4 times per day   potassium chloride  20 mEq Oral BID   rosuvastatin  5 mg Oral Daily   sodium chloride flush  3 mL Intravenous Q12H   Continuous Infusions:  sodium chloride     amiodarone 30 mg/hr (02/24/23 0700)   potassium chloride 10 mEq (02/24/23 0740)   PRN Meds: sodium chloride, acetaminophen, LORazepam, ondansetron (ZOFRAN) IV, mouth rinse, sodium chloride flush, traZODone   Vital Signs    Vitals:   02/24/23 0700 02/24/23 0730 02/24/23 0759 02/24/23 0800  BP: 94/72 (!) 87/72  (!) 85/70  Pulse: 98 99  (!) 110  Resp: '18 16  19  '$ Temp:   97.9 F (36.6 C)   TempSrc:   Axillary   SpO2: 96% 96%  95%  Weight:        Intake/Output Summary (Last 24 hours) at 02/24/2023 0808 Last data filed at 02/24/2023 0740 Gross per 24 hour  Intake 910.35 ml  Output 3750 ml  Net -2839.65 ml      02/23/2023    4:44 AM 02/22/2023    6:32 PM 02/22/2023    1:35 PM  Last 3 Weights  Weight (lbs) 174 lb 9.7 oz 174 lb 13.2 oz 170 lb  Weight (kg) 79.2 kg 79.3 kg 77.111 kg      Telemetry    Atrial flutter with variable AV block, ventricular rate around 100 bpm- Personally Reviewed  ECG    Atrial flutter with variable AV block- Personally Reviewed  Physical Exam  Restless, slightly disoriented GEN: No acute distress.   Neck: No  JVD Cardiac: Irregular, [id number] holosystolic murmur heard best at the left lower sternal border, but also at the apex; no diastolic murmurs, rubs, or gallops.  Respiratory: Clear to auscultation bilaterally. GI: Soft, nontender, non-distended  MS: No edema; No deformity. Neuro:  Nonfocal  Psych: Anxious  Labs    High Sensitivity Troponin:   Recent Labs  Lab 02/22/23 1534 02/22/23 1701  TROPONINIHS 45* 44*     Chemistry Recent Labs  Lab 02/21/23 1203 02/21/23 1203 02/22/23 1534 02/22/23 1552 02/23/23 0623 02/24/23 0101  NA 140   < > 137 138 136 135  K 4.2  --  4.2 4.2 3.6 2.7*  CL 103  --  105 106 104 97*  CO2 22  --  20*  --  21* 25  GLUCOSE 114*   < > 129* 125* 129* 131*  BUN 22   < > 28* 27* 32* 30*  CREATININE 0.80  --  0.85 0.70 0.99 0.95  CALCIUM 9.3  --  8.8*  --  8.4* 8.2*  MG  --   --   --   --   --  1.9  PROT 6.7  --  6.7  --   --   --   ALBUMIN  4.4  --  3.8  --   --   --   AST 29  --  39  --   --   --   ALT 38*  --  43  --   --   --   ALKPHOS 72  --  59  --   --   --   BILITOT 0.9  --  1.6*  --   --   --   GFRNONAA  --   --  >60  --  >60 >60  ANIONGAP  --   --  12  --  11 13   < > = values in this interval not displayed.    Lipids No results for input(s): "CHOL", "TRIG", "HDL", "LABVLDL", "LDLCALC", "CHOLHDL" in the last 168 hours.  Hematology Recent Labs  Lab 02/21/23 1203 02/22/23 1534 02/22/23 1552  WBC 4.3 4.7  --   RBC 3.75* 3.88  --   HGB 8.4* 8.7* 10.2*  HCT 29.2* 31.1* 30.0*  MCV 78* 80.2  --   MCH 22.4* 22.4*  --   MCHC 28.8* 28.0*  --   RDW 20.9* 23.4*  --   PLT 313 316  --    Thyroid No results for input(s): "TSH", "FREET4" in the last 168 hours.  BNP Recent Labs  Lab 02/21/23 1203 02/22/23 1534  BNP  --  876.3*  PROBNP 4,604*  --     DDimer No results for input(s): "DDIMER" in the last 168 hours.   Radiology    DG Chest Port 1 View  Result Date: 02/22/2023 CLINICAL DATA:  Shortness of breath. EXAM: PORTABLE CHEST 1 VIEW  COMPARISON:  02/02/2023. FINDINGS: Moderate pulmonary edema with increased cardiomegaly and small right pleural effusion. No pneumothorax. Unchanged postoperative appearance of the median sternotomy with TAVR in place. Moderate hiatal hernia, unchanged. IMPRESSION: Moderate pulmonary edema with small right pleural effusion. Electronically Signed   By: Emmit Alexanders M.D.   On: 02/22/2023 15:34    Cardiac Studies   Echocardiogram 08/04/2022    1. Left ventricular ejection fraction, by estimation, is 50 to 55%. The  left ventricle has low normal function. The left ventricle has no regional  wall motion abnormalities. Left ventricular diastolic parameters are  consistent with Grade I diastolic  dysfunction (impaired relaxation).   2. Right ventricular systolic function is normal. The right ventricular  size is normal. There is normal pulmonary artery systolic pressure.   3. Left atrial size was mildly dilated.   4. The mitral valve is normal in structure. Moderate mitral valve  regurgitation. No evidence of mitral stenosis.   5. Prosthetic valve. Satisfactory function.. The aortic valve was not  well visualized. Aortic valve regurgitation is not visualized. No aortic  stenosis is present. There   6. The inferior vena cava is normal in size with greater than 50%  respiratory variability, suggesting right atrial pressure of 3 mmHg.    E[facility] 02/03/2023 (unable to review images) SUMMARY  LV ejection fraction = 45-50%.  There is mild global hypokinesis of the left ventricle.  Left ventricular filling pattern is indeterminate.  The right ventricle is mildly dilated.  The right ventricular systolic function is normal.  The right atrium is mildly dilated.  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  Dilated IVC with normal collapsibility consistent with a mildly elevated RA  pressure.  There is no pericardial  effusion.   Patient Profile  62 y.o. female with history of  ascending aortic aneurysm repair with aortic valve resuspension 2017 followed by aortic valve bioprosthetic replacement 2019, previous stroke, hypertension, hyperlipidemia, nonobstructive CAD, PFO, heart failure with mildly reduced ejection fraction, recent severe GI bleed requiring transfusion ([place], endoscopy showed hiatal hernia and severe colonic diverticulosis), new development of moderate to severe MR, now admitted with acute hypoxic respiratory failure due to heart failure exacerbation in the setting of atrial flutter with rapid ventricular response  Assessment & Plan    Acute respiratory failure with hypoxia: Remains on BiPAP, a little agitated, appears to be orthopneic, but lungs sound clear.  Had good diuresis.   Cardiogenic shock: Mildly hypotensive, but maintains good urine output and normal renal function.  Had mildly elevated lactic acid level a couple of days ago.  May need pressor support. CHF: She presented with evidence of "gross volume overload".  Presumed decompensation following transfusion during her last hospitalization and new onset atrial arrhythmia.  However, there is also a suggestion of worsening mitral insufficiency.  Continue to try to optimize fluid status, but have to correct her hypokalemia before we can give more diuretics.  Prior to admission her medication list included Wilder Glade and Delene Loll but according to the patient she was not taking the [place].  Unable to start any vasodilators with her current blood pressure. MR: Previous echocardiograms reported moderate MR, but now described as moderate to severe.   Acute MR could explain her fragile hemodynamic status.  Unfortunately at this time she is unable to cooperate with repeat transthoracic echocardiography and her respiratory status does not allow safe TEE. Mechanism uncertain (she was not in atrial flutter as far as we can tell during her  previous hospitalization).  Has not had fever and WBC is normal, which lessens suspicion for endocarditis. Atrial flutter with RVR: On amiodarone IV for rate control due to hypotension.  Has a history of previous atrial fibrillation.  Eliquis was held for several days during her recent hospitalization for GI bleeding and she has a history of previous stroke, so some reluctance to consider cardioversion without a TEE.  We may have to do this if her hemodynamics worsen.  If she cannot swallow oral Eliquis will have to switch to intravenous heparin. Nonobstructive CAD: Only had minor coronary stenoses at the time of previous cardiac catheterization in 2019.  Minimally elevated adynamic high-sensitivity troponin on arrival.  No clear evidence of ischemia at this time. S/P bioprosthetic AVR: normal valve function on recent transthoracic echo History of ascending aortic repair for dissection 2017: Note report of a 1.2 cm area of "outpouching" in the ascending aorta. Hypokalemia: Unable to safely swallow large pills, we may be limited to administering the potassium intravenously which will slow down the process. CRITICAL CARE Performed by: Dani Gobble Rateel Beldin   Total critical care time: 45 minutes  Critical care time was exclusive of separately billable procedures and treating other patients.  Critical care was necessary to treat or prevent imminent or life-threatening deterioration.  Critical care was time spent personally by me on the following activities: development of treatment plan with patient and/or surrogate as well as nursing, discussions with consultants, evaluation of patient's response to treatment, examination of patient, obtaining history from patient or surrogate, ordering and performing treatments and interventions, ordering and review of laboratory studies, ordering and review of radiographic studies, pulse oximetry and re-evaluation of patient's condition.   For questions or updates, please  contact [facility] Please consult [email or web address] for contact  info under        Signed, Sanda Klein, MD  02/24/2023, 8:08 AM

## 2023-02-25 ENCOUNTER — Inpatient Hospital Stay (HOSPITAL_COMMUNITY): Payer: PPO

## 2023-02-25 DIAGNOSIS — I340 Nonrheumatic mitral (valve) insufficiency: Secondary | ICD-10-CM | POA: Diagnosis not present

## 2023-02-25 DIAGNOSIS — I5023 Acute on chronic systolic (congestive) heart failure: Secondary | ICD-10-CM | POA: Diagnosis not present

## 2023-02-25 DIAGNOSIS — I484 Atypical atrial flutter: Secondary | ICD-10-CM | POA: Diagnosis not present

## 2023-02-25 DIAGNOSIS — J810 Acute pulmonary edema: Secondary | ICD-10-CM | POA: Diagnosis not present

## 2023-02-25 LAB — BASIC METABOLIC PANEL
Anion gap: 9 (ref 5–15)
Anion gap: 12 (ref 5–15)
BUN: 32 mg/dL — ABNORMAL HIGH (ref 8–23)
BUN: 33 mg/dL — ABNORMAL HIGH (ref 8–23)
CO2: 28 mmol/L (ref 22–32)
CO2: 26 mmol/L (ref 22–32)
Calcium: 7.8 mg/dL — ABNORMAL LOW (ref 8.9–10.3)
Calcium: 8.3 mg/dL — ABNORMAL LOW (ref 8.9–10.3)
Chloride: 92 mmol/L — ABNORMAL LOW (ref 98–111)
Chloride: 93 mmol/L — ABNORMAL LOW (ref 98–111)
Creatinine, Ser: 1.15 mg/dL — ABNORMAL HIGH (ref 0.44–1.00)
Creatinine, Ser: 1.3 mg/dL — ABNORMAL HIGH (ref 0.44–1.00)
GFR, Estimated: 54 mL/min — ABNORMAL LOW (ref >60)
GFR, Estimated: 46 mL/min — ABNORMAL LOW (ref >60)
Glucose, Bld: 140 mg/dL — ABNORMAL HIGH (ref 70–99)
Glucose, Bld: 221 mg/dL — ABNORMAL HIGH (ref 70–99)
Potassium: 3.4 mmol/L — ABNORMAL LOW (ref 3.5–5.1)
Potassium: 3.6 mmol/L (ref 3.5–5.1)
Sodium: 129 mmol/L — ABNORMAL LOW (ref 135–145)
Sodium: 131 mmol/L — ABNORMAL LOW (ref 135–145)

## 2023-02-25 LAB — LACTIC ACID, PLASMA: Lactic Acid, Venous: 2.7 mmol/L (ref 0.5–1.9)

## 2023-02-25 MED ORDER — POTASSIUM CHLORIDE 10 MEQ/100ML IV SOLN
10.0000 meq | INTRAVENOUS | Status: AC
  Administered 2023-02-25 (×2): 10 meq via INTRAVENOUS
  Filled 2023-02-25 (×2): qty 100

## 2023-02-25 MED ORDER — DAPAGLIFLOZIN PROPANEDIOL 10 MG PO TABS
10.0000 mg | ORAL_TABLET | Freq: Every day | ORAL | Status: DC
  Administered 2023-02-25: 10 mg via ORAL
  Filled 2023-02-25 (×2): qty 1

## 2023-02-25 MED ORDER — FUROSEMIDE 10 MG/ML IJ SOLN
80.0000 mg | Freq: Every day | INTRAMUSCULAR | Status: DC
  Administered 2023-02-26: 80 mg via INTRAVENOUS
  Filled 2023-02-25: qty 8

## 2023-02-25 NOTE — Progress Notes (Addendum)
Rounding Note    Patient Name: Stacy Jenkins Date of Encounter: 02/25/2023  [place] Cardiologist: [name], MD   Subjective   Seems to be doing much better today.  Has not required BiPAP in the last 24 hours.  Sitting up in chair, mildly tachypneic. Mildly disoriented ("hospital", "[facility]", "February"). Remains in atrial flutter but ventricular rate is much better controlled, mostly 80s-90s.  Blood pressure is higher with systolic in the low [id number]. Net diuresis 1 L yesterday, 3.5 L since admission.  Weight is down 6 pounds. Plan for modified barium swallow.  Inpatient Medications    Scheduled Meds:  apixaban  5 mg Oral BID   Chlorhexidine Gluconate Cloth  6 each Topical Daily   furosemide  80 mg Intravenous BID   mouth rinse  15 mL Mouth Rinse 4 times per day   potassium chloride  20 mEq Oral BID   rosuvastatin  5 mg Oral Daily   sodium chloride flush  3 mL Intravenous Q12H   Continuous Infusions:  sodium chloride     amiodarone 30 mg/hr (02/25/23 0800)   PRN Meds: sodium chloride, acetaminophen, LORazepam, ondansetron (ZOFRAN) IV, mouth rinse, sodium chloride flush, traZODone   Vital Signs    Vitals:   02/25/23 0730 02/25/23 0749 02/25/23 0800 02/25/23 0830  BP: 108/83   103/81  Pulse: (!) 105  (!) 110 (!) 109  Resp: 20  (!) 24 (!) 22  Temp:  98.1 F (36.7 C)    TempSrc:  Oral    SpO2: 90%  90% 93%  Weight:        Intake/Output Summary (Last 24 hours) at 02/25/2023 0849 Last data filed at 02/25/2023 0800 Gross per 24 hour  Intake 1546.76 ml  Output 1540 ml  Net 6.76 ml      02/25/2023    4:34 AM 02/24/2023    9:02 AM 02/23/2023    4:44 AM  Last 3 Weights  Weight (lbs) 168 lb 10.4 oz 168 lb 14 oz 174 lb 9.7 oz  Weight (kg) 76.5 kg 76.6 kg 79.2 kg      Telemetry    Atrial flutter with variable block.- Personally Reviewed  ECG    No new tracing- Personally Reviewed  Physical Exam  Looks comfortable sitting up in  chair GEN: No acute distress.   Neck: 6-7 cm JVD Cardiac: Irregular, 3/6 holosystolic murmur heard best at the apex but radiating towards the base as well.  No diastolic murmurs, rubs, or gallops.  Respiratory: Diminished breath sounds in the right base.  Dull to percussion in the same area.  Few rales in the left base.  GI: Soft, nontender, non-distended  MS: No edema; No deformity. Neuro:  Nonfocal  Psych: Depressed, anxious.  Labs    High Sensitivity Troponin:   Recent Labs  Lab 02/22/23 1534 02/22/23 1701  TROPONINIHS 45* 44*     Chemistry Recent Labs  Lab 02/21/23 1203 02/22/23 1534 02/22/23 1552 02/24/23 0101 02/24/23 1028 02/24/23 1821 02/25/23 0129  NA 140 137   < > 135  --  131* 129*  K 4.2 4.2   < > 2.7* 3.6 3.8 3.4*  CL 103 105   < > 97*  --  94* 92*  CO2 22 20*   < > 25  --  25 28  GLUCOSE 114* 129*   < > 131*  --  167* 140*  BUN 22 28*   < > 30*  --  34* 32*  CREATININE 0.80 0.85   < > 0.95  --  1.27* 1.15*  CALCIUM 9.3 8.8*   < > 8.2*  --  8.4* 7.8*  MG  --   --   --  1.9  --  2.1  --   PROT 6.7 6.7  --   --   --   --   --   ALBUMIN 4.4 3.8  --   --   --   --   --   AST 29 39  --   --   --   --   --   ALT 38* 43  --   --   --   --   --   ALKPHOS 72 59  --   --   --   --   --   BILITOT 0.9 1.6*  --   --   --   --   --   GFRNONAA  --  >60   < > >60  --  48* 54*  ANIONGAP  --  12   < > 13  --  12 9   < > = values in this interval not displayed.    Lipids No results for input(s): "CHOL", "TRIG", "HDL", "LABVLDL", "LDLCALC", "CHOLHDL" in the last 168 hours.  Hematology Recent Labs  Lab 02/21/23 1203 02/22/23 1534 02/22/23 1552  WBC 4.3 4.7  --   RBC 3.75* 3.88  --   HGB 8.4* 8.7* 10.2*  HCT 29.2* 31.1* 30.0*  MCV 78* 80.2  --   MCH 22.4* 22.4*  --   MCHC 28.8* 28.0*  --   RDW 20.9* 23.4*  --   PLT 313 316  --    Thyroid No results for input(s): "TSH", "FREET4" in the last 168 hours.  BNP Recent Labs  Lab 02/21/23 1203 02/22/23 1534  BNP   --  876.3*  PROBNP 4,604*  --     DDimer No results for input(s): "DDIMER" in the last 168 hours.   Radiology    No results found.  Cardiac Studies    Echocardiogram 08/04/2022     1. Left ventricular ejection fraction, by estimation, is 50 to 55%. The  left ventricle has low normal function. The left ventricle has no regional  wall motion abnormalities. Left ventricular diastolic parameters are  consistent with Grade I diastolic  dysfunction (impaired relaxation).   2. Right ventricular systolic function is normal. The right ventricular  size is normal. There is normal pulmonary artery systolic pressure.   3. Left atrial size was mildly dilated.   4. The mitral valve is normal in structure. Moderate mitral valve  regurgitation. No evidence of mitral stenosis.   5. Prosthetic valve. Satisfactory function.. The aortic valve was not  well visualized. Aortic valve regurgitation is not visualized. No aortic  stenosis is present. There   6. The inferior vena cava is normal in size with greater than 50%  respiratory variability, suggesting right atrial pressure of 3 mmHg.      Echo [facility] 02/03/2023 (unable to review images) SUMMARY  LV ejection fraction = 45-50%.  There is mild global hypokinesis of the left ventricle.  Left ventricular filling pattern is indeterminate.  The right ventricle is mildly dilated.  The right ventricular systolic function is normal.  The right atrium is mildly dilated.  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  Dilated IVC with normal collapsibility consistent with a mildly  elevated RA  pressure.  There is no pericardial effusion.   Patient Profile     62 y.o. female  with history of  ascending aortic aneurysm repair with aortic valve resuspension 2017 followed by aortic valve bioprosthetic replacement 2019, previous stroke, hypertension, hyperlipidemia, nonobstructive  CAD, PFO, heart failure with mildly reduced ejection fraction, recent severe GI bleed requiring transfusion ([place], endoscopy showed hiatal hernia and severe colonic diverticulosis), new development of moderate to severe MR, now admitted with acute hypoxic respiratory failure due to heart failure exacerbation in the setting of atrial flutter with rapid ventricular response   Assessment & Plan    Acute respiratory failure with hypoxia: Markedly improved, now on nasal cannula.  Still with some evidence of hypervolemia.  Continue diuretics, but will reduce frequency due to the hyponatremia.  Recheck chest x-ray. Cardiogenic shock: Seems to have resolved.  Renal function is improving.  Repeat a lactic acid level. CHF: She presented with evidence of "gross volume overload".  Presumed decompensation following transfusion during her last hospitalization and new onset atrial arrhythmia.  However, there is also a suggestion of worsening mitral insufficiency.  Prior to admission her medication list included Wilder Glade and Delene Loll but according to the patient she was not taking the B[place].  Her blood pressure still little too low to start the [place], but will resume [place]. MR: I am concerned that she has severe and acutely worsened mitral insufficiency.  Previous echocardiograms reported moderate MR, but now described as moderate to severe on the echocardiogram [facility].  Acute MR could explain her fragile hemodynamic status.  Plan TEE prior to her cardioversion and this will also allow [place] to evaluate the mitral valve.  Mechanism uncertain (she was not in atrial flutter as far as we can tell during her previous hospitalization).  Has not had fever and WBC is normal, which lessens suspicion for endocarditis. Atrial flutter with RVR: On amiodarone IV for rate control due to hypotension.  Will transition to p.o. amiodarone tomorrow.  Has a history of previous atrial fibrillation.  Eliquis was held  for several days during her recent hospitalization for GI bleeding, so will evaluate for left atrial appendage thrombus with TEE.   Nonobstructive CAD: Only had minor coronary stenoses at the time of previous cardiac catheterization in 2019.  Minimally elevated adynamic high-sensitivity troponin on arrival.  No clear evidence of ischemia at this time. S/P bioprosthetic AVR: normal valve function on recent transthoracic echo History of ascending aortic repair for dissection 2017: Note report of a 1.2 cm area of "outpouching" in the ascending aorta. Hypokalemia: Improved.  Has difficulty swallowing the large pills. Hyponatremia: Slightly worsened.  She is really not drinking fluids so I do not think fluid restriction will make a difference. CRITICAL CARE Performed by: Dani Gobble Pinkney Venard  TEE and cardioversion procedure with deep sedation with anesthesiology was discussed with her husband, Herbie [place]" as well as with the patient.  They both understand and agree to go ahead with the procedure (they had already provided consent for these procedures as an elective event last week).  Also spoke with the patient's son, Yetzali Wey.  He will be returning from [place] this evening.  He is a Conservation officer, historic buildings and lives only 4 miles away from his parents.  As opposed to the patient's report, he appeared to be involved with his family and their health problems.   Total critical care time: 45 minutes  Critical care time was exclusive of separately billable procedures and treating other  patients.  Critical care was necessary to treat or prevent imminent or life-threatening deterioration.  Critical care was time spent personally by me on the following activities: development of treatment plan with patient and/or surrogate as well as nursing, discussions with consultants, evaluation of patient's response to treatment, examination of patient, obtaining history from patient or surrogate, ordering and performing  treatments and interventions, ordering and review of laboratory studies, ordering and review of radiographic studies, pulse oximetry and re-evaluation of patient's condition.   For questions or updates, please contact [place] Please consult [email or web address] for contact info under        Signed, Sanda Klein, MD  02/25/2023, 8:49 AM

## 2023-02-26 ENCOUNTER — Encounter (HOSPITAL_COMMUNITY): Payer: Self-pay | Admitting: Cardiology

## 2023-02-26 ENCOUNTER — Inpatient Hospital Stay: Payer: Self-pay

## 2023-02-26 ENCOUNTER — Encounter (HOSPITAL_COMMUNITY)
Admission: EM | Disposition: A | Discharge status: Home / Self Care | Payer: Self-pay | Source: Home / Self Care | Attending: Internal Medicine

## 2023-02-26 ENCOUNTER — Encounter (HOSPITAL_COMMUNITY): Payer: Self-pay | Admitting: Anesthesiology

## 2023-02-26 ENCOUNTER — Other Ambulatory Visit (HOSPITAL_COMMUNITY): Payer: PPO

## 2023-02-26 ENCOUNTER — Inpatient Hospital Stay (HOSPITAL_COMMUNITY)
Admission: EM | Disposition: A | Discharge status: Home / Self Care | Payer: Self-pay | Source: Home / Self Care | Attending: Internal Medicine

## 2023-02-26 DIAGNOSIS — R570 Cardiogenic shock: Secondary | ICD-10-CM | POA: Diagnosis not present

## 2023-02-26 DIAGNOSIS — I5023 Acute on chronic systolic (congestive) heart failure: Secondary | ICD-10-CM | POA: Diagnosis not present

## 2023-02-26 DIAGNOSIS — R0600 Dyspnea, unspecified: Secondary | ICD-10-CM | POA: Diagnosis not present

## 2023-02-26 HISTORY — PX: RIGHT HEART CATH: CATH118263

## 2023-02-26 LAB — CBC
HCT: 27.8 % — ABNORMAL LOW (ref 36.0–46.0)
Hemoglobin: 8.4 g/dL — ABNORMAL LOW (ref 12.0–15.0)
MCH: 22.6 pg — ABNORMAL LOW (ref 26.0–34.0)
MCHC: 30.2 g/dL (ref 30.0–36.0)
MCV: 74.9 fL — ABNORMAL LOW (ref 80.0–100.0)
Platelets: 245 10*3/uL (ref 150–400)
RBC: 3.71 MIL/uL — ABNORMAL LOW (ref 3.87–5.11)
RDW: 22.3 % — ABNORMAL HIGH (ref 11.5–15.5)
WBC: 6.5 10*3/uL (ref 4.0–10.5)
nRBC: 1.2 % — ABNORMAL HIGH (ref 0.0–0.2)

## 2023-02-26 LAB — BASIC METABOLIC PANEL
Anion gap: 11 (ref 5–15)
BUN: 34 mg/dL — ABNORMAL HIGH (ref 8–23)
CO2: 27 mmol/L (ref 22–32)
Calcium: 8.2 mg/dL — ABNORMAL LOW (ref 8.9–10.3)
Chloride: 90 mmol/L — ABNORMAL LOW (ref 98–111)
Creatinine, Ser: 1.08 mg/dL — ABNORMAL HIGH (ref 0.44–1.00)
GFR, Estimated: 58 mL/min — ABNORMAL LOW (ref >60)
Glucose, Bld: 147 mg/dL — ABNORMAL HIGH (ref 70–99)
Potassium: 3.6 mmol/L (ref 3.5–5.1)
Sodium: 128 mmol/L — ABNORMAL LOW (ref 135–145)

## 2023-02-26 LAB — POCT I-STAT EG7
Acid-Base Excess: 3 mmol/L — ABNORMAL HIGH (ref 0.0–2.0)
Acid-Base Excess: 4 mmol/L — ABNORMAL HIGH (ref 0.0–2.0)
Bicarbonate: 28.3 mmol/L — ABNORMAL HIGH (ref 20.0–28.0)
Bicarbonate: 28.4 mmol/L — ABNORMAL HIGH (ref 20.0–28.0)
Calcium, Ion: 1.08 mmol/L — ABNORMAL LOW (ref 1.15–1.40)
Calcium, Ion: 1.08 mmol/L — ABNORMAL LOW (ref 1.15–1.40)
HCT: 31 % — ABNORMAL LOW (ref 36.0–46.0)
HCT: 31 % — ABNORMAL LOW (ref 36.0–46.0)
Hemoglobin: 10.5 g/dL — ABNORMAL LOW (ref 12.0–15.0)
Hemoglobin: 10.5 g/dL — ABNORMAL LOW (ref 12.0–15.0)
O2 Saturation: 55 %
O2 Saturation: 57 %
Potassium: 3.8 mmol/L (ref 3.5–5.1)
Potassium: 3.8 mmol/L (ref 3.5–5.1)
Sodium: 130 mmol/L — ABNORMAL LOW (ref 135–145)
Sodium: 130 mmol/L — ABNORMAL LOW (ref 135–145)
TCO2: 30 mmol/L (ref 22–32)
TCO2: 30 mmol/L (ref 22–32)
pCO2, Ven: 43.1 mmHg — ABNORMAL LOW (ref 44–60)
pCO2, Ven: 43.3 mmHg — ABNORMAL LOW (ref 44–60)
pH, Ven: 7.423 (ref 7.25–7.43)
pH, Ven: 7.426 (ref 7.25–7.43)
pO2, Ven: 29 mmHg — CL (ref 32–45)
pO2, Ven: 29 mmHg — CL (ref 32–45)

## 2023-02-26 LAB — HEPATIC FUNCTION PANEL
ALT: 200 U/L — ABNORMAL HIGH (ref 0–44)
AST: 223 U/L — ABNORMAL HIGH (ref 15–41)
Albumin: 3.5 g/dL (ref 3.5–5.0)
Alkaline Phosphatase: 193 U/L — ABNORMAL HIGH (ref 38–126)
Bilirubin, Direct: 0.7 mg/dL — ABNORMAL HIGH (ref 0.0–0.2)
Indirect Bilirubin: 1.2 mg/dL — ABNORMAL HIGH (ref 0.3–0.9)
Total Bilirubin: 1.9 mg/dL — ABNORMAL HIGH (ref 0.3–1.2)
Total Protein: 6.3 g/dL — ABNORMAL LOW (ref 6.5–8.1)

## 2023-02-26 LAB — LACTIC ACID, PLASMA
Lactic Acid, Venous: 3.1 mmol/L (ref 0.5–1.9)
Lactic Acid, Venous: 3.4 mmol/L (ref 0.5–1.9)
Lactic Acid, Venous: 1.7 mmol/L (ref 0.5–1.9)
Lactic Acid, Venous: 1.4 mmol/L (ref 0.5–1.9)

## 2023-02-26 LAB — RETICULOCYTES
Immature Retic Fract: 36.8 % — ABNORMAL HIGH (ref 2.3–15.9)
RBC.: 3.87 MIL/uL (ref 3.87–5.11)
Retic Count, Absolute: 55 10*3/uL (ref 19.0–186.0)
Retic Ct Pct: 1.4 % (ref 0.4–3.1)

## 2023-02-26 LAB — COOXEMETRY PANEL
Carboxyhemoglobin: 1.2 % (ref 0.5–1.5)
Methemoglobin: <0.7 % (ref 0.0–1.5)
O2 Saturation: 57 %
Total hemoglobin: 8.1 g/dL — ABNORMAL LOW (ref 12.0–16.0)

## 2023-02-26 LAB — IRON AND TIBC
Iron: 18 ug/dL — ABNORMAL LOW (ref 28–170)
Saturation Ratios: 4 % — ABNORMAL LOW (ref 10.4–31.8)
TIBC: 466 ug/dL — ABNORMAL HIGH (ref 250–450)
UIBC: 448 ug/dL

## 2023-02-26 LAB — PROTIME-INR
INR: 2.4 — ABNORMAL HIGH (ref 0.8–1.2)
Prothrombin Time: 25.6 seconds — ABNORMAL HIGH (ref 11.4–15.2)

## 2023-02-26 LAB — FERRITIN: Ferritin: 11 ng/mL (ref 11–307)

## 2023-02-26 LAB — VITAMIN B12: Vitamin B-12: 2026 pg/mL — ABNORMAL HIGH (ref 180–914)

## 2023-02-26 LAB — FOLATE: Folate: 29.4 ng/mL (ref >5.9)

## 2023-02-26 LAB — MAGNESIUM: Magnesium: 2.2 mg/dL (ref 1.7–2.4)

## 2023-02-26 SURGERY — RIGHT HEART CATH
Anesthesia: LOCAL

## 2023-02-26 SURGERY — ECHOCARDIOGRAM, TRANSESOPHAGEAL
Anesthesia: Monitor Anesthesia Care

## 2023-02-26 SURGERY — CARDIOVERSION
Anesthesia: General

## 2023-02-26 MED ORDER — METOLAZONE 2.5 MG PO TABS
2.5000 mg | ORAL_TABLET | Freq: Once | ORAL | Status: AC
  Administered 2023-02-26: 2.5 mg via ORAL
  Filled 2023-02-26: qty 1

## 2023-02-26 MED ORDER — POTASSIUM CHLORIDE 10 MEQ/100ML IV SOLN
10.0000 meq | INTRAVENOUS | Status: AC
  Administered 2023-02-26: 10 meq via INTRAVENOUS
  Filled 2023-02-26: qty 100

## 2023-02-26 MED ORDER — ONDANSETRON HCL 4 MG/2ML IJ SOLN: 4.0000 mg | Freq: Four times a day (QID) | INTRAMUSCULAR | Status: DC | PRN

## 2023-02-26 MED ORDER — LIDOCAINE HCL (PF) 1 % IJ SOLN
INTRAMUSCULAR | Status: AC
Start: 2023-02-26 — End: ?
  Filled 2023-02-26: qty 30

## 2023-02-26 MED ORDER — SODIUM CHLORIDE 0.9% FLUSH: 3.0000 mL | INTRAVENOUS | Status: DC | PRN

## 2023-02-26 MED ORDER — IRON SUCROSE 500 MG IVPB - SIMPLE MED
500.0000 mg | Freq: Once | INTRAVENOUS | Status: AC
  Administered 2023-02-26: 500 mg via INTRAVENOUS
  Filled 2023-02-26: qty 275

## 2023-02-26 MED ORDER — SODIUM CHLORIDE 0.9% FLUSH: 10.0000 mL | INTRAVENOUS | Status: DC | PRN

## 2023-02-26 MED ORDER — LABETALOL HCL 5 MG/ML IV SOLN: 10.0000 mg | INTRAVENOUS | Status: AC | PRN

## 2023-02-26 MED ORDER — SODIUM CHLORIDE 0.9 % IV SOLN: 250.0000 mL | INTRAVENOUS | Status: DC | PRN

## 2023-02-26 MED ORDER — DAPAGLIFLOZIN PROPANEDIOL 10 MG PO TABS: 10.0000 mg | ORAL_TABLET | Freq: Every day | ORAL | Status: DC

## 2023-02-26 MED ORDER — POTASSIUM CHLORIDE 20 MEQ PO PACK
20.0000 meq | PACK | Freq: Two times a day (BID) | ORAL | Status: DC
  Administered 2023-02-26 – 2023-02-27 (×2): 20 meq via ORAL
  Filled 2023-02-26 (×2): qty 1

## 2023-02-26 MED ORDER — SODIUM CHLORIDE 0.9% FLUSH
3.0000 mL | Freq: Two times a day (BID) | INTRAVENOUS | Status: DC
  Administered 2023-02-26 – 2023-03-07 (×13): 3 mL via INTRAVENOUS

## 2023-02-26 MED ORDER — NOREPINEPHRINE 4 MG/250ML-% IV SOLN: 0.0000 ug/min | INTRAVENOUS | Status: DC

## 2023-02-26 MED ORDER — HYDRALAZINE HCL 20 MG/ML IJ SOLN: 10.0000 mg | INTRAMUSCULAR | Status: AC | PRN

## 2023-02-26 MED ORDER — NOREPINEPHRINE 4 MG/250ML-% IV SOLN
0.0000 ug/min | INTRAVENOUS | Status: DC
  Administered 2023-02-26 (×2): 5 ug/min via INTRAVENOUS
  Administered 2023-02-27: 4 ug/min via INTRAVENOUS
  Administered 2023-02-28 (×2): 10 ug/min via INTRAVENOUS
  Administered 2023-02-28: 5 ug/min via INTRAVENOUS
  Administered 2023-03-01 (×2): 10 ug/min via INTRAVENOUS
  Administered 2023-03-02: 4 ug/min via INTRAVENOUS
  Administered 2023-03-02: 3 ug/min via INTRAVENOUS
  Filled 2023-02-26 (×10): qty 250

## 2023-02-26 MED ORDER — SODIUM CHLORIDE 0.9% FLUSH
3.0000 mL | Freq: Two times a day (BID) | INTRAVENOUS | Status: DC
  Administered 2023-02-27 – 2023-03-07 (×12): 3 mL via INTRAVENOUS

## 2023-02-26 MED ORDER — FUROSEMIDE 10 MG/ML IJ SOLN
80.0000 mg | Freq: Two times a day (BID) | INTRAMUSCULAR | Status: DC
  Administered 2023-02-26 – 2023-02-27 (×3): 80 mg via INTRAVENOUS
  Filled 2023-02-26 (×3): qty 8

## 2023-02-26 MED ORDER — HEPARIN (PORCINE) IN NACL 1000-0.9 UT/500ML-% IV SOLN
INTRAVENOUS | Status: DC | PRN
  Administered 2023-02-26: 500 mL

## 2023-02-26 MED ORDER — HEPARIN (PORCINE) 25000 UT/250ML-% IV SOLN
1100.0000 [IU]/h | INTRAVENOUS | Status: DC
  Administered 2023-02-26: 1100 [IU]/h via INTRAVENOUS
  Filled 2023-02-26 (×2): qty 250

## 2023-02-26 MED ORDER — LIDOCAINE HCL (PF) 1 % IJ SOLN
INTRAMUSCULAR | Status: AC
  Filled 2023-02-26: qty 30

## 2023-02-26 MED ORDER — SODIUM CHLORIDE 0.9% FLUSH
10.0000 mL | Freq: Two times a day (BID) | INTRAVENOUS | Status: DC
  Administered 2023-02-26 – 2023-02-27 (×3): 10 mL
  Administered 2023-02-28: 30 mL
  Administered 2023-02-28 – 2023-03-01 (×2): 10 mL
  Administered 2023-03-01: 30 mL
  Administered 2023-03-02 (×2): 10 mL
  Administered 2023-03-03: 20 mL
  Administered 2023-03-03 – 2023-03-04 (×3): 10 mL
  Administered 2023-03-05: 20 mL
  Administered 2023-03-05 – 2023-03-07 (×5): 10 mL

## 2023-02-26 MED ORDER — ACETAMINOPHEN 325 MG PO TABS: 650.0000 mg | ORAL_TABLET | ORAL | Status: DC | PRN

## 2023-02-26 MED ORDER — SODIUM CHLORIDE 0.9 % IV SOLN
250.0000 mL | INTRAVENOUS | Status: DC
  Administered 2023-02-28: 250 mL via INTRAVENOUS

## 2023-02-26 MED ORDER — LIDOCAINE HCL (PF) 1 % IJ SOLN
INTRAMUSCULAR | Status: DC | PRN
  Administered 2023-02-26: 5 mL via INTRADERMAL
  Administered 2023-02-26 (×2): 2 mL via INTRADERMAL

## 2023-02-26 SURGICAL SUPPLY — 9 items
CATH SWAN GANZ VIP 7.5F (CATHETERS) IMPLANT
KIT MICROPUNCTURE NIT STIFF (SHEATH) IMPLANT
PACK CARDIAC CATHETERIZATION (CUSTOM PROCEDURE TRAY) ×1 IMPLANT
SHEATH PINNACLE 8F 10CM (SHEATH) IMPLANT
SHEATH PROBE COVER 6X72 (BAG) IMPLANT
SLEEVE REPOSITIONING LENGTH 30 (MISCELLANEOUS) IMPLANT
TRANSDUCER W/STOPCOCK (MISCELLANEOUS) ×1 IMPLANT
WIRE EMERALD 3MM-J .025X260CM (WIRE) IMPLANT
WIRE MICROINTRODUCER 60CM (WIRE) IMPLANT

## 2023-02-26 NOTE — Progress Notes (Signed)
Peripherally Inserted Central Catheter Placement  The IV Nurse has discussed with the patient and/or persons authorized to consent for the patient, the purpose of this procedure and the potential benefits and risks involved with this procedure.  The benefits include less needle sticks, lab draws from the catheter, and the patient may be discharged home with the catheter. Risks include, but not limited to, infection, bleeding, blood clot (thrombus formation), and puncture of an artery; nerve damage and irregular heartbeat and possibility to perform a PICC exchange if needed/ordered by physician.  Alternatives to this procedure were also discussed.  Bard Power PICC patient education guide, fact sheet on infection prevention and patient information card has been provided to patient /or left at bedside.   Consent obtained with husband at bedside  PICC Placement Documentation  PICC Triple Lumen 02/26/23 Right Brachial 36 cm 1 cm (Active)  Indication for Insertion or Continuance of Line Vasoactive infusions 02/26/23 1400  Exposed Catheter (cm) 1 cm 02/26/23 1400  Site Assessment Clean, Dry, Intact 02/26/23 1400  Lumen #1 Status Flushed;Saline locked;Blood return noted 02/26/23 1400  Lumen #2 Status Flushed;Saline locked;Blood return noted 02/26/23 1400  Lumen #3 Status Flushed;Saline locked;Blood return noted 02/26/23 1400  Dressing Type Transparent;Securing device 02/26/23 1400  Dressing Status Antimicrobial disc in place;Clean, Dry, Intact 02/26/23 1400  Safety Lock Not Applicable [id number]  Line Care Connections checked and tightened 02/26/23 1400  Dressing Intervention New dressing 02/26/23 1400  Dressing Change Due 03/05/23 02/26/23 1400       Holley Bouche Renee 02/26/2023, 2:58 PM

## 2023-02-26 NOTE — Progress Notes (Signed)
Attempted to reach husband via phone for PICC consent but no answer , will try again soon.

## 2023-02-26 NOTE — Consult Note (Signed)
NAME:  Stacy Jenkins, MRN:  [id number], DOB:  07-Jan-1961, LOS: 4 ADMISSION DATE:  02/22/2023, CONSULTATION DATE:  02/26/2023 REFERRING MD:  Gasper Sells , CHIEF COMPLAINT:  hypoxic respiratory failure.   History of Present Illness:  62 year old woman who presented with increase SOB and LE edema.  Found to be in flutter and echo showed severe MR.   Held off on DCCV due to recent UGI, Eliquis had only recently been restarted.  Increase SOB, rising lactate today.   Aortic dissection 2017 with repair, BPAVR 12/19 for severe AI.  Post operative AF, started on amiodarone and Eliquis.   Admitted to [facility] 12/24 for suspected GI with HB 4.8 then. No clear up source but severe diverticular disease on C-scope.  Severe MR noted on echocardiogram then    Pertinent  Medical History   Past Medical History:  Diagnosis Date   Abnormal echocardiogram 08/20/2018   Added automatically from request for surgery [id number]   Acute blood loss anemia 11/19/2018   Acute hyperglycemia 03/10/2016   Acute post-operative pain 11/19/2018   Aortic valve regurgitation 08/20/2018   Formatting of this note might be different from the original. Added automatically from request for surgery [id number]   Aphasia    Ascending aortic aneurysm ([place])    ASD (atrial septal defect) 02/03/2023   Benign essential hypertension 08/09/2017   at goal on current meds  Formatting of this note might be different from the original. 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 4.6 cm   Elevated blood-pressure reading without diagnosis of hypertension 01/29/2018   will monitor  Formatting of this note might be different from the original. will monitor   Expressive aphasia 11/20/2018   Hiatal hernia 01/29/2018   History of atrial fibrillation 11/20/2018   History of CVA (cerebrovascular accident) 08/09/2017   History of  migraine 01/29/2018   Hyperlipidemia 01/08/2019   Hypertension 01/08/2019   Hypoglycemia 01/29/2018   dietary info  Formatting of this note might be different from the original. dietary info   Medicare annual wellness visit, subsequent 02/17/2019   Mixed dyslipidemia 08/09/2017   Nonrheumatic aortic valve insufficiency    Other abnormal glucose 01/29/2018   PFO (patent foramen ovale) 09/11/2012   Postoperative atrial fibrillation ([place]) 11/20/2018   S/P ascending aortic aneurysm repair 11/20/2018   S/P AVR 11/19/2018   Stroke (cerebrum) ([place]) - Left MCA branchStroke in a patient with history of prior strokes and mild residual deficits, s/p tPA 07/13/2017   Stroke ([place])    Vitamin D deficiency 01/29/2018     Significant Hospital Events: Including procedures, antibiotic start and stop dates in addition to other pertinent events   2/29  admitted 3/3 RHC for persistent symptoms and increasing lactate. Showed evidence of high left sided filling pressors.   Interim History / Subjective:  Feels better since morning following diuresis.   Objective   Blood pressure (!) 88/68, pulse (!) 107, temperature 98.3 F (36.8 C), temperature source Axillary, resp. rate (!) 24, height '5\' 7"'$  (1.702 m), weight 76.4 kg, SpO2 92 %.    FiO2 (%):  [30 %] 30 %   Intake/Output Summary (Last 24 hours) at 02/26/2023 1029 Last data filed at 02/26/2023 1000 Gross per 24 hour  Intake 456.29 ml  Output 855 ml  Net -398.71 ml   Filed Weights   02/24/23 0902 02/25/23 0434 02/26/23 0500  Weight: 76.6 kg 76.5 kg 76.4 kg    Examination: General: appears older than stated. No distress HENT: moist mucosae Lungs: clear anteriorly Cardiovascular: 3/6 HSM, diffuse apex beat.  Abdomen: soft Extremities: moderate edema. Capillary refill delayed.  Neuro: somnolent but not focal  GU: clear urine.   Ancillary tests personally reviewed  Prior echo in 8/23 showing moderate MR Lact 3.1  INR 2.4 Low serum iron and  marginal ferritin Assessment & Plan:  Critically ill due to acute decompensated mixed HF requiring titration of NE Acute hypoxic respiratory failure from cardiogenic pulmonary edema.  Moderate to severe functional MR Pulmonary venous hypertension due to above  Afib with RVR.  Type 2 DM Anemia due to iron deficiency  Plan:  - Continue diuresis - Titrate NE to keep MAP > 65 -  IV iron to replete stores - Continue amiodarone IV and anticoagulate in anticipation of potential DCCV/TEE. - TTE to initially reassess MV - may need mitral clip if MR severe. Alternatively, HF may be due to stress, fluids from recent GIB admission.  - Titrate O2 to keep Sat > 92%. Doesn't look like she will ventilatory support.   Best Practice (right click and "Reselect all SmartList Selections" daily)   Diet/type: Regular consistency (see orders) DVT prophylaxis: systemic heparin GI prophylaxis: N/A Lines: Central line Foley:  Yes, and it is still needed Code Status:  full code Last date of multidisciplinary goals of care discussion [updated at bedside]   CRITICAL CARE Performed by: Kipp Brood   Total critical care time: 35 minutes  Critical care time was exclusive of separately billable procedures and treating other patients.  Critical care was necessary to treat or prevent imminent or life-threatening deterioration.  Critical care was time spent personally by me on the following activities: development of treatment plan with patient and/or surrogate as well as nursing, discussions with consultants, evaluation of patient's response to treatment, examination of patient, obtaining history from patient or surrogate, ordering and performing treatments and interventions, ordering and review of laboratory studies, ordering and review of radiographic studies, pulse oximetry, re-evaluation of patient's condition and participation in multidisciplinary rounds.  Kipp Brood, MD [facility] ICU Physician [place]  Pager: [phone] Mobile: [phone] After hours: [phone].

## 2023-02-26 NOTE — Progress Notes (Signed)
Orthopedic Tech Progress Note Patient Details:  Stacy Jenkins 01-Aug-1961 [id number]  Ortho Devices Type of Ortho Device: Louretta Parma boot Ortho Device/Splint Location: BLE Ortho Device/Splint Interventions: Ordered, Application   Post Interventions Patient Tolerated: Well Instructions Provided: Care of device  Donnita Farina A Dessie Delcarlo 02/26/2023, 8:52 AM

## 2023-02-26 NOTE — Progress Notes (Addendum)
ANTICOAGULATION CONSULT NOTE - Initial Consult  Pharmacy Consult for heparin Indication: atrial fibrillation, while apixaban on hold  Allergies  Allergen Reactions   Penicillin G Rash    Pt states she is not allergic    Patient Measurements: Height: '5\' 7"'$  (170.2 cm) Weight: 76.4 kg (168 lb 6.9 oz) IBW/kg (Calculated) : 61.6 Heparin Dosing Weight: 76.4 kg  Vital Signs: Temp: 98.3 F (36.8 C) (03/04 0000) Temp Source: Axillary (03/04 0000) BP: 88/68 (03/04 0900) Pulse Rate: 107 (03/04 0900)  Labs: Recent Labs    02/25/23 0129 02/25/23 1259 02/26/23 0023  HGB  --   --  8.4*  HCT  --   --  27.8*  PLT  --   --  245  CREATININE 1.15* 1.30* 1.08*    Estimated Creatinine Clearance: 56.8 mL/min (A) (by C-G formula based on SCr of 1.08 mg/dL (H)).   Medical History: Past Medical History:  Diagnosis Date   Abnormal echocardiogram 08/20/2018   Added automatically from request for surgery [id number]   Acute blood loss anemia 11/19/2018   Acute hyperglycemia 03/10/2016   Acute post-operative pain 11/19/2018   Aortic valve regurgitation 08/20/2018   Formatting of this note might be different from the original. Added automatically from request for surgery [id number]   Aphasia    Ascending aortic aneurysm ([place])    ASD (atrial septal defect) 02/03/2023   Benign essential hypertension 08/09/2017   at goal on current meds  Formatting of this note might be different from the original. 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 4.6 cm   Elevated blood-pressure reading without diagnosis of hypertension 01/29/2018   will monitor  Formatting of this note might be different from the original. will monitor   Expressive aphasia 11/20/2018   Hiatal hernia 01/29/2018   History of atrial fibrillation 11/20/2018   History of CVA (cerebrovascular accident) 08/09/2017   History  of migraine 01/29/2018   Hyperlipidemia 01/08/2019   Hypertension 01/08/2019   Hypoglycemia 01/29/2018   dietary info  Formatting of this note might be different from the original. dietary info   Medicare annual wellness visit, subsequent 02/17/2019   Mixed dyslipidemia 08/09/2017   Nonrheumatic aortic valve insufficiency    Other abnormal glucose 01/29/2018   PFO (patent foramen ovale) 09/11/2012   Postoperative atrial fibrillation ([place]) 11/20/2018   S/P ascending aortic aneurysm repair 11/20/2018   S/P AVR 11/19/2018   Stroke (cerebrum) ([place]) - Left MCA branchStroke in a patient with history of prior strokes and mild residual deficits, s/p tPA 07/13/2017   Stroke ([place])    Vitamin D deficiency 01/29/2018    Medications:  Scheduled:   Chlorhexidine Gluconate Cloth  6 each Topical Daily   [START ON 02/27/2023] dapagliflozin propanediol  10 mg Oral Daily   furosemide  80 mg Intravenous Daily   mouth rinse  15 mL Mouth Rinse 4 times per day   potassium chloride  20 mEq Oral BID   potassium chloride  20 mEq Oral BID   rosuvastatin  5 mg Oral Daily   sodium chloride flush  3 mL Intravenous Q12H    Assessment: 62 yof presenting with HF exacerbation in setting of atrial flutter with RVR. On apixaban PTA for Afib (LD 3/3'@2244'$ ).   Hgb 8.4, plt 245. No s/sx of bleeding.   Given recent DOAC, will monitor both aPTT and heparin level until correlate.  Goal of Therapy:  Heparin level 0.3-0.7 units/ml aPTT 66-102 seconds Monitor platelets by anticoagulation protocol: Yes   Plan:  Hold apixaban Start heparin infusion at 1100 units/hr on 3/4'@1030'$  Order levels 6 hr after start  Monitor daily HL/aPTT, CBC, and for s/sx of bleeding  ADDENDUM Underwent RHC showing elevated filling pressures and mildly reduced CO in addition to severe mitral regurgitation. PICC placed. Will start heparin as listed above and get levels in 6 hours.  Antonietta Jewel, PharmD, [place] Clinical Pharmacist   Phone: [phone] 02/26/2023 9:43 AM  Please check AMION for all [place] phone numbers After 10:00 PM, call [place] [phone]

## 2023-02-26 NOTE — Consult Note (Signed)
Advanced Heart Failure Team Consult Note   Primary Physician: [name], MD PCP-Cardiologist:  [name], MD  Reason for Consultation: Cardiogenic Shock   HPI:    Stacy Jenkins is seen today for evaluation of Cardiogenic Shock at the request of Dr Julieanne Manson.   Stacy Jenkins is a 62 year old with a history of thoracic aneurysm 2017, bioprosthetic aortic valve, DMII, HLD, HTN. PAF,  GI bleed, diverticulosis, chronic systolic HF.    Echocardiogram in 2019 showed severe aortic regurgitation with EF down to 35 to 40% with inferior septal akinesis.  Subsequent left and right heart cath at [facility] on 09/11/2018 demonstrated 10% left main, <25% mid LAD, <25% RCA lesion. Patient ultimately underwent bioprosthetic AVR on 11/18/2018 with 21 mm Sorin Perceval rapid deployment sutureless stented bovine pericardial valve.  Preoperative CT did mention a focal outpouching on the medial side of ascending aorta up to 1.2 cm.  She had postoperative atrial fibrillation requiring amiodarone and Eliquis.   Hospitalized at [facility] 02/03/23 with acute blood loss anemia. Transferred to [facility]. Hgb 4.8. Transfused 3UPRBC. EGD + Colonoscopy severe diverticulosis. Eliquis was restarted at discharge.   Saw Dr Irean Hong 02/21/23. EKG showed A flutter.  Set up for TEE/Cardioversion.   Presented for scheduled TEE/DC-CV on 2/29 this was cancelled due to acute respiratory failure. Admitted 02/22/23 with A fib RVR and acute respiratory failure. CXR with pulmonary edema. O2 sats 88%. Placed on Bipap and diltiazem. Cardiology consulted. Diuresing with IV lasix. Over the weekend on and off Bipap. Remains in A fib RVR. Hypotensive over the week. Lactic acid 2.5>2.2>2.7. Hgb 10.2>8.4   Review of Systems: [y] = yes, '[ ]'$  = no   General: Weight gain '[ ]'$ ; Weight loss '[ ]'$ ; Anorexia '[ ]'$ ; Fatigue [ Y]; Fever '[ ]'$ ; Chills '[ ]'$ ; Weakness [Y ]  Cardiac: Chest pain/pressure '[ ]'$ ; Resting SOB [Y ]; Exertional SOB [ Y];  Orthopnea [Y ]; Pedal Edema '[ ]'$ ; Palpitations '[ ]'$ ; Syncope '[ ]'$ ; Presyncope '[ ]'$ ; Paroxysmal nocturnal dyspnea'[ ]'$   Pulmonary: Cough '[ ]'$ ; Wheezing'[ ]'$ ; Hemoptysis'[ ]'$ ; Sputum '[ ]'$ ; Snoring '[ ]'$   GI: Vomiting'[ ]'$ ; Dysphagia'[ ]'$ ; Melena'[ ]'$ ; Hematochezia '[ ]'$ ; Heartburn'[ ]'$ ; Abdominal pain '[ ]'$ ; Constipation '[ ]'$ ; Diarrhea '[ ]'$ ; BRBPR '[ ]'$   GU: Hematuria'[ ]'$ ; Dysuria '[ ]'$ ; Nocturia'[ ]'$   Vascular: Pain in legs with walking '[ ]'$ ; Pain in feet with lying flat '[ ]'$ ; Non-healing sores '[ ]'$ ; Stroke '[ ]'$ ; TIA '[ ]'$ ; Slurred speech '[ ]'$ ;  Neuro: Headaches'[ ]'$ ; Vertigo'[ ]'$ ; Seizures'[ ]'$ ; Paresthesias'[ ]'$ ;Blurred vision '[ ]'$ ; Diplopia '[ ]'$ ; Vision changes '[ ]'$   Ortho/Skin: Arthritis '[ ]'$ ; Joint pain '[ ]'$ ; Muscle pain '[ ]'$ ; Joint swelling '[ ]'$ ; Back Pain '[ ]'$ ; Rash '[ ]'$   Psych: Depression'[ ]'$ ; Anxiety'[ ]'$   Heme: Bleeding problems '[ ]'$ ; Clotting disorders '[ ]'$ ; Anemia [Y ]  Endocrine: Diabetes [Y ]; Thyroid dysfunction'[ ]'$   Home Medications Prior to Admission medications   Medication Sig Start Date End Date Taking? Authorizing Provider  apixaban (ELIQUIS) 5 MG TABS tablet Take 1 tablet (5 mg total) by mouth 2 (two) times daily. 01/21/19  Yes Revankar, Reita Cliche, MD  aspirin EC 81 MG tablet Take 81 mg by mouth daily.   Yes [provider]  Coenzyme Q10 (COQ10 PO) Take 1 capsule by mouth daily.   Yes [provider]  dapagliflozin propanediol (FARXIGA) 5 MG TABS tablet Take 1 tablet (5 mg total) by mouth daily  before breakfast. 12/19/22  Yes Revankar, Reita Cliche, MD  Multiple Vitamin (MULTIVITAMIN) tablet Take 1 tablet by mouth daily.   Yes [provider]  Omega-3 Fatty Acids (FISH OIL PO) Take 1 capsule by mouth daily.   Yes [provider]  rosuvastatin (CRESTOR) 5 MG tablet Take 1 tablet by mouth once daily 01/30/23  Yes Revankar, Reita Cliche, MD  sacubitril-valsartan (ENTRESTO) 24-26 MG Take 1 tablet by mouth 2 (two) times daily. Patient not taking: Reported on 02/22/2023 10/03/22   Revankar, Reita Cliche, MD    Past Medical  History: Past Medical History:  Diagnosis Date   Abnormal echocardiogram 08/20/2018   Added automatically from request for surgery [id number]   Acute blood loss anemia 11/19/2018   Acute hyperglycemia 03/10/2016   Acute post-operative pain 11/19/2018   Aortic valve regurgitation 08/20/2018   Formatting of this note might be different from the original. Added automatically from request for surgery [id number]   Aphasia    Ascending aortic aneurysm ([place])    ASD (atrial septal defect) 02/03/2023   Benign essential hypertension 08/09/2017   at goal on current meds  Formatting of this note might be different from the original. at goal on current meds   Cardiomyopathy ([place]) 01/29/2018   Dilated aortic root ([place]) 09/11/2012   Formatting of this note might be different from the original. 4.6 cm   Elevated blood-pressure reading without diagnosis of hypertension 01/29/2018   will monitor  Formatting of this note might be different from the original. will monitor   Expressive aphasia 11/20/2018   Hiatal hernia 01/29/2018   History of atrial fibrillation 11/20/2018   History of CVA (cerebrovascular accident) 08/09/2017   History of migraine 01/29/2018   Hyperlipidemia 01/08/2019   Hypertension 01/08/2019   Hypoglycemia 01/29/2018   dietary info  Formatting of this note might be different from the original. dietary info   Medicare annual wellness visit, subsequent 02/17/2019   Mixed dyslipidemia 08/09/2017   Nonrheumatic aortic valve insufficiency    Other abnormal glucose 01/29/2018   PFO (patent foramen ovale) 09/11/2012   Postoperative atrial fibrillation ([place]) 11/20/2018   S/P ascending aortic aneurysm repair 11/20/2018   S/P AVR 11/19/2018   Stroke (cerebrum) ([place]) - Left MCA branchStroke in a patient with history of prior strokes and mild residual deficits, s/p tPA 07/13/2017   Stroke ([place])    Vitamin D deficiency 01/29/2018    Past Surgical History: Past Surgical History:  Procedure  Laterality Date   aortic valve surgery  02/2016   TEE WITHOUT CARDIOVERSION N/A 07/16/2017   Procedure: TRANSESOPHAGEAL ECHOCARDIOGRAM (TEE);  Surgeon: Sanda Klein, MD;  [facility] ENDOSCOPY;  Service: Cardiovascular;  Laterality: N/A;    Family History: Family History  Problem Relation Age of Onset   Hypertension Mother    Heart disease Father    Heart attack Father    Heart disease Brother     Social History: Social History   Socioeconomic History   Marital status: Married    Spouse name: Not on file   Number of children: Not on file   Years of education: Not on file   Highest education level: Not on file  Occupational History   Not on file  Tobacco Use   Smoking status: Never   Smokeless tobacco: Never  Vaping Use   Vaping Use: Never used  Substance and Sexual Activity   Alcohol use: No   Drug use: No   Sexual activity: Not on file  Other Topics Concern  Not on file  Social History Narrative   Not on file   Social Determinants of Health   Financial Resource Strain: Not on file  Food Insecurity: No Food Insecurity (10/05/2022)   Hunger Vital Sign    Worried About Running Out of Food in the Last Year: Never true    Ran Out of Food in the Last Year: Never true  Transportation Needs: No Transportation Needs (10/05/2022)   PRAPARE - Hydrologist (Medical): No    Lack of Transportation (Non-Medical): No  Physical Activity: Not on file  Stress: Not on file  Social Connections: Not on file    Allergies:  Allergies  Allergen Reactions   Penicillin G Rash    Pt states she is not allergic    Objective:    Vital Signs:   Temp:  [98.1 F (36.7 C)-98.5 F (36.9 C)] 98.3 F (36.8 C) (03/04 0000) Pulse Rate:  [92-113] 107 (03/04 0900) Resp:  [16-37] 24 (03/04 0900) BP: (80-112)/(48-98) 88/68 (03/04 0900) SpO2:  [89 %-100 %] 91 % (03/04 0900) FiO2 (%):  [30 %] 30 % (03/04 0400) Weight:  [76.4 kg] 76.4 kg (03/04 0500) Last  BM Date : 02/23/23  Weight change: Filed Weights   02/24/23 0902 02/25/23 0434 02/26/23 0500  Weight: 76.6 kg 76.5 kg 76.4 kg    Intake/Output:   Intake/Output Summary (Last 24 hours) at 02/26/2023 0938 Last data filed at 02/26/2023 0600 Gross per 24 hour  Intake 472.88 ml  Output 1070 ml  Net -597.12 ml      Physical Exam    General:  Increase WOB HEENT: normal Neck: supple. JVP elevated. . Carotids 2+ bilat; no bruits. No lymphadenopathy or thyromegaly appreciated. Cor: PMI nondisplaced. Tachy Irregular rate & rhythm. No rubs, gallops or murmurs. Lungs: clear Abdomen: soft, nontender, nondistended. No hepatosplenomegaly. No bruits or masses. Good bowel sounds. Extremities: cool, no cyanosis, clubbing, rash, edema Neuro: alert & orientedx3, cranial nerves grossly intact. moves all 4 extremities w/o difficulty. Affect pleasant   Telemetry    A Fib RVR 120s.   EKG    N/A   Labs   Basic Metabolic Panel: Recent Labs  Lab 02/24/23 0101 02/24/23 1028 02/24/23 1821 02/25/23 0129 02/25/23 1259 02/26/23 0023 02/26/23 0832  NA 135  --  131* 129* 131* 128*  --   K 2.7* 3.6 3.8 3.4* 3.6 3.6  --   CL 97*  --  94* 92* 93* 90*  --   CO2 25  --  '25 28 26 27  '$ --   GLUCOSE 131*  --  167* 140* 221* 147*  --   BUN 30*  --  34* 32* 33* 34*  --   CREATININE 0.95  --  1.27* 1.15* 1.30* 1.08*  --   CALCIUM 8.2*  --  8.4* 7.8* 8.3* 8.2*  --   MG 1.9  --  2.1  --   --   --  2.2    Liver Function Tests: Recent Labs  Lab 02/21/23 1203 02/22/23 1534  AST 29 39  ALT 38* 43  ALKPHOS 72 59  BILITOT 0.9 1.6*  PROT 6.7 6.7  ALBUMIN 4.4 3.8   No results for input(s): "LIPASE", "AMYLASE" in the last 168 hours. No results for input(s): "AMMONIA" in the last 168 hours.  CBC: Recent Labs  Lab 02/21/23 1203 02/22/23 1534 02/22/23 1552 02/26/23 0023  WBC 4.3 4.7  --  6.5  NEUTROABS  --  3.6  --   --  HGB 8.4* 8.7* 10.2* 8.4*  HCT 29.2* 31.1* 30.0* 27.8*  MCV 78* 80.2  --   74.9*  PLT 313 316  --  245    Cardiac Enzymes: No results for input(s): "CKTOTAL", "CKMB", "CKMBINDEX", "TROPONINI" in the last 168 hours.  BNP: BNP (last 3 results) Recent Labs    02/22/23 1534  BNP 876.3*    ProBNP (last 3 results) Recent Labs    02/21/23 1203  PROBNP 4,604*     CBG: No results for input(s): "GLUCAP" in the last 168 hours.  Coagulation Studies: No results for input(s): "LABPROT", "INR" in the last 72 hours.   Imaging   [place] EKG SITE RITE  Result Date: 02/26/2023 If Site Rite image not attached, placement could not be confirmed due to current cardiac rhythm.  DG Chest Port 1 View  Result Date: 02/25/2023 CLINICAL DATA:  Pulmonary edema EXAM: PORTABLE CHEST 1 VIEW COMPARISON:  X-ray 02/22/2023 FINDINGS: Stable enlarged cardiopericardial silhouette tortuous aorta. Status post median sternotomy. Status post TAVR. Small pleural effusions, right-greater-than-left with some adjacent opacity. Vascular congestion. No pneumothorax. Overlapping cardiac leads. Curvature of the spine. Surgical clips along the right upper chest. IMPRESSION: No significant interval change Electronically Signed   By: Jill Side M.D.   On: 02/25/2023 11:40     Medications:     Current Medications:  Chlorhexidine Gluconate Cloth  6 each Topical Daily   furosemide  80 mg Intravenous Daily   mouth rinse  15 mL Mouth Rinse 4 times per day   potassium chloride  20 mEq Oral BID   potassium chloride  20 mEq Oral BID   rosuvastatin  5 mg Oral Daily   sodium chloride flush  3 mL Intravenous Q12H   sodium chloride flush  3 mL Intravenous Q12H    Infusions:  sodium chloride     sodium chloride     amiodarone 30 mg/hr (02/26/23 0703)   heparin     norepinephrine (LEVOPHED) Adult infusion     potassium chloride 10 mEq (02/26/23 0908)      Patient Profile  Stacy Jenkins is a 62 year old with a history of CVA, hypertension, hyperlipidemia, minimal CAD, patent foramen ovale, chronic  systolic heart failure, 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   Admitted 02/22/2023 for the evaluation of dyspnea.    Assessment/Plan   1.  Shock --> Suspect Cardiogenic -Most recent ECHO EF 45-50% at [facility]. Will need to repeat. Concerned EF will be worse due to ongoing A fib RVR.  Persistent hypotension with elevated lactic acid. CO2 27 SCAI C. Advanced Heart Failure Team Consulted  RHC to further assess will need to leave swan ganz catheter.  Start pressors +/- inotropes.  Obtain lactic acid. Follow CO-OX   2. A fib RVR  Continue amio drip.  Start heparin drip TEE/DC-CV timing after RHC.   3. Acute Hypoxic Respiratory Failure  On admit placed on Bipap. CCM consulted today   4. MR  Will need TEE to further assess.   5.  Anemia  Recent GI bleed 01/2023  EGD/Colonoscopy with diverticulosis.  Follow CBC  6. CAD 2019 Non obstructive CAD-->10% left main, <25% mid LAD, <25% RCA lesion.   7. H/O Bioprosthetic AVR 2019   8. DMII   9. H/O CVA   Length of Stay: 4  Darrick Grinder, NP  02/26/2023, 9:38 AM  Advanced Heart Failure Team Pager [phone] (M-F; 7a - 5p)  Please contact  [facility] for night-coverage after hours (4p -7a ) and weekends on [email or web address]

## 2023-02-26 NOTE — Progress Notes (Addendum)
Rounding Note    Patient Name: Stacy Jenkins Date of Encounter: 02/26/2023  [place] Cardiologist: [name], MD   Subjective   Since last , patient is down to [place] Lactate increased to 2.7  Inpatient Medications    Scheduled Meds:  apixaban  5 mg Oral BID   Chlorhexidine Gluconate Cloth  6 each Topical Daily   dapagliflozin propanediol  10 mg Oral Daily   furosemide  80 mg Intravenous Daily   mouth rinse  15 mL Mouth Rinse 4 times per day   potassium chloride  20 mEq Oral BID   rosuvastatin  5 mg Oral Daily   sodium chloride flush  3 mL Intravenous Q12H   Continuous Infusions:  sodium chloride     amiodarone 30 mg/hr (02/26/23 0703)   PRN Meds: sodium chloride, acetaminophen, LORazepam, ondansetron (ZOFRAN) IV, mouth rinse, sodium chloride flush, traZODone   Vital Signs    Vitals:   02/26/23 0417 02/26/23 0500 02/26/23 0600 02/26/23 0700  BP: (!) 86/61 (!) 89/74 90/69 (!) 87/68  Pulse: 97 (!) 104 100 (!) 106  Resp: '18 19 16 '$ (!) 23  Temp:      TempSrc:      SpO2: 96% 94% 96% 91%  Weight:  76.4 kg      Intake/Output Summary (Last 24 hours) at 02/26/2023 0742 Last data filed at 02/26/2023 0600 Gross per 24 hour  Intake 614.58 ml  Output 1070 ml  Net -455.42 ml      02/26/2023    5:00 AM 02/25/2023    4:34 AM 02/24/2023    9:02 AM  Last 3 Weights  Weight (lbs) 168 lb 6.9 oz 168 lb 10.4 oz 168 lb 14 oz  Weight (kg) 76.4 kg 76.5 kg 76.6 kg      Telemetry    AFL RVR.- Personally Reviewed  ECG    Concerned for atrial flutter, P wave morphology different than 01/2021 (clearly sinus) Personally Reviewed  Physical Exam   Gen: mild distress, thin female Cardiac: No Rubs or Gallops,  IV holosystolic murmur, regular tachycardia Respiratory: Clear to auscultation bilaterally, poor effort, with tachypnea  respiratory rate GI: Soft, nontender, non-distended  MS:  she has cool extremities Neuro:  At time of evaluation, alert and oriented to  person/place/time but with little insight into her disease  Labs    High Sensitivity Troponin:   Recent Labs  Lab 02/22/23 1534 02/22/23 1701  TROPONINIHS 45* 44*     Chemistry Recent Labs  Lab 02/21/23 1203 02/22/23 1534 02/22/23 1552 02/24/23 0101 02/24/23 1028 02/24/23 1821 02/25/23 0129 02/25/23 1259 02/26/23 0023  NA 140 137   < > 135  --  131* 129* 131* 128*  K 4.2 4.2   < > 2.7*   < > 3.8 3.4* 3.6 3.6  CL 103 105   < > 97*  --  94* 92* 93* 90*  CO2 22 20*   < > 25  --  '25 28 26 27  '$ GLUCOSE 114* 129*   < > 131*  --  167* 140* 221* 147*  BUN 22 28*   < > 30*  --  34* 32* 33* 34*  CREATININE 0.80 0.85   < > 0.95  --  1.27* 1.15* 1.30* 1.08*  CALCIUM 9.3 8.8*   < > 8.2*  --  8.4* 7.8* 8.3* 8.2*  MG  --   --   --  1.9  --  2.1  --   --   --  PROT 6.7 6.7  --   --   --   --   --   --   --   ALBUMIN 4.4 3.8  --   --   --   --   --   --   --   AST 29 39  --   --   --   --   --   --   --   ALT 38* 43  --   --   --   --   --   --   --   ALKPHOS 72 59  --   --   --   --   --   --   --   BILITOT 0.9 1.6*  --   --   --   --   --   --   --   GFRNONAA  --  >60   < > >60  --  48* 54* 46* 58*  ANIONGAP  --  12   < > 13  --  '12 9 12 11   '$ < > = values in this interval not displayed.    Lipids No results for input(s): "CHOL", "TRIG", "HDL", "LABVLDL", "LDLCALC", "CHOLHDL" in the last 168 hours.  Hematology Recent Labs  Lab 02/21/23 1203 02/22/23 1534 02/22/23 1552 02/26/23 0023  WBC 4.3 4.7  --  6.5  RBC 3.75* 3.88  --  3.71*  HGB 8.4* 8.7* 10.2* 8.4*  HCT 29.2* 31.1* 30.0* 27.8*  MCV 78* 80.2  --  74.9*  MCH 22.4* 22.4*  --  22.6*  MCHC 28.8* 28.0*  --  30.2  RDW 20.9* 23.4*  --  22.3*  PLT 313 316  --  245   Thyroid No results for input(s): "TSH", "FREET4" in the last 168 hours.  BNP Recent Labs  Lab 02/21/23 1203 02/22/23 1534  BNP  --  876.3*  PROBNP 4,604*  --     DDimer No results for input(s): "DDIMER" in the last 168 hours.   Radiology    DG Chest  Port 1 View  Result Date: 02/25/2023 CLINICAL DATA:  Pulmonary edema EXAM: PORTABLE CHEST 1 VIEW COMPARISON:  X-ray 02/22/2023 FINDINGS: Stable enlarged cardiopericardial silhouette tortuous aorta. Status post median sternotomy. Status post TAVR. Small pleural effusions, right-greater-than-left with some adjacent opacity. Vascular congestion. No pneumothorax. Overlapping cardiac leads. Curvature of the spine. Surgical clips along the right upper chest. IMPRESSION: No significant interval change Electronically Signed   By: Jill Side M.D.   On: 02/25/2023 11:40    Cardiac Studies    Cardiac Studies & Procedures       ECHOCARDIOGRAM  ECHOCARDIOGRAM COMPLETE 08/04/2022  Narrative ECHOCARDIOGRAM REPORT    Patient Name:   Stacy Jenkins Date of Exam: 08/04/2022 Medical Rec #:  [id number]       Height:       68.0 in Accession #:    [id number]      Weight:       168.8 lb Date of Birth:  1961/02/01       BSA:          1.902 m Patient Age:    9 years        BP:           98/62 mmHg Patient Gender: F               HR:           71 bpm. Exam Location:  [place]  Procedure: 2D Echo, Cardiac Doppler, Color Doppler and Strain Analysis  Indications:    Aortic valve insufficiency, etiology of cardiac valve disease unspecified [I35.1 (ICD-10-CM)]; Ischemic cardiomyopathy [I25.5 (ICD-10-CM)]; Benign essential hypertension [I10 (ICD-10-CM)]; History of atrial fibrillation [Z86.79 (ICD-10-CM)]; History of CVA (cerebrovascular accident) [Z86.73 (ICD-10-CM)]; S/P ascending aortic aneurysm repair [id number], Z86.79 (ICD-10-CM)]  History:        Patient has prior history of Echocardiogram examinations, most recent 03/22/2021. Cardiomyopathy, Stroke, Aortic Valve Disease; Risk Factors:Hypertension. S/P ascending aortic aneurysm repair. Aortic Valve: 21 mm Sorin valve is present in the aortic position.  Sonographer:    Philipp Deputy RDCS Referring Phys: [place]   1.  Left ventricular ejection fraction, by estimation, is 50 to 55%. The left ventricle has low normal function. The left ventricle has no regional wall motion abnormalities. Left ventricular diastolic parameters are consistent with Grade I diastolic dysfunction (impaired relaxation). 2. Right ventricular systolic function is normal. The right ventricular size is normal. There is normal pulmonary artery systolic pressure. 3. Left atrial size was mildly dilated. 4. The mitral valve is normal in structure. Moderate mitral valve regurgitation. No evidence of mitral stenosis. 5. Prosthetic valve. Satisfactory function.. The aortic valve was not well visualized. Aortic valve regurgitation is not visualized. No aortic stenosis is present. There 6. The inferior vena cava is normal in size with greater than 50% respiratory variability, suggesting right atrial pressure of 3 mmHg.  FINDINGS Left Ventricle: Left ventricular ejection fraction, by estimation, is 50 to 55%. The left ventricle has low normal function. The left ventricle has no regional wall motion abnormalities. The left ventricular internal cavity size was normal in size. There is no left ventricular hypertrophy. Left ventricular diastolic parameters are consistent with Grade I diastolic dysfunction (impaired relaxation).  Right Ventricle: The right ventricular size is normal. No increase in right ventricular wall thickness. Right ventricular systolic function is normal. There is normal pulmonary artery systolic pressure. The tricuspid regurgitant velocity is 1.81 m/s, and with an assumed right atrial pressure of 3 mmHg, the estimated right ventricular systolic pressure is [id number] mmHg.  Left Atrium: Left atrial size was mildly dilated.  Right Atrium: Right atrial size was normal in size.  Pericardium: There is no evidence of pericardial effusion.  Mitral Valve: The mitral valve is normal in structure. Moderate mitral valve regurgitation. No  evidence of mitral valve stenosis.  Tricuspid Valve: The tricuspid valve is normal in structure. Tricuspid valve regurgitation is not demonstrated. No evidence of tricuspid stenosis.  Aortic Valve: Prosthetic valve. Satisfactory function. The aortic valve was not well visualized. Aortic valve regurgitation is not visualized. No aortic stenosis is present. Aortic valve mean gradient measures 14.5 mmHg. Aortic valve peak gradient measures 23.1 mmHg. Aortic valve area, by VTI measures 0.83 cm. There is a 21 mm Sorin valve present in the aortic position.  Pulmonic Valve: The pulmonic valve was normal in structure. Pulmonic valve regurgitation is not visualized. No evidence of pulmonic stenosis.  Aorta: The aortic root is normal in size and structure.  Venous: The inferior vena cava is normal in size with greater than 50% respiratory variability, suggesting right atrial pressure of 3 mmHg.  IAS/Shunts: No atrial level shunt detected by color flow Doppler.   LEFT VENTRICLE PLAX 2D LVIDd:         6.10 cm   Diastology LVIDs:         4.60 cm   LV e' medial:    5.98 cm/s LV PW:  0.80 cm   LV E/e' medial:  6.1 LV IVS:        0.70 cm   LV e' lateral:   8.81 cm/s LVOT diam:     2.00 cm   LV E/e' lateral: 4.1 LV SV:         40 LV SV Index:   21 LVOT Area:     3.14 cm   RIGHT VENTRICLE            IVC RV Basal diam:  2.80 cm    IVC diam: 1.50 cm RV S prime:     8.38 cm/s TAPSE (M-mode): 1.9 cm  LEFT ATRIUM           Index        RIGHT ATRIUM           Index LA diam:      3.50 cm 1.84 cm/m   RA Area:     14.60 cm LA Vol (A2C): 39.9 ml 20.98 ml/m  RA Volume:   34.80 ml  18.30 ml/m LA Vol (A4C): 39.8 ml 20.93 ml/m AORTIC VALVE AV Area (Vmax):    0.88 cm AV Area (Vmean):   0.82 cm AV Area (VTI):     0.83 cm AV Vmax:           240.50 cm/s AV Vmean:          181.500 cm/s AV VTI:            0.486 m AV Peak Grad:      23.1 mmHg AV Mean Grad:      14.5 mmHg LVOT Vmax:          67.50 cm/s LVOT Vmean:        47.300 cm/s LVOT VTI:          0.128 m LVOT/AV VTI ratio: 0.26  AORTA Ao Root diam: 3.30 cm Ao Asc diam:  3.10 cm Ao Desc diam: 2.60 cm  MITRAL VALVE               TRICUSPID VALVE MV Area (PHT): 3.27 cm    TR Peak grad:   13.1 mmHg MV Decel Time: 232 msec    TR Vmax:        181.00 cm/s MR Peak grad: 104.7 mmHg MR Mean grad: 68.0 mmHg    SHUNTS MR Vmax:      511.50 cm/s  Systemic VTI:  0.13 m MR Vmean:     393.0 cm/s   Systemic Diam: 2.00 cm MV E velocity: 36.20 cm/s MV A velocity: 48.00 cm/s MV E/A ratio:  0.75  Jyl Heinz MD Electronically signed by Jyl Heinz MD Signature Date/Time: 08/04/2022/4:10:34 PM    Final   TEE  ECHO TEE 07/16/2017  Narrative *[facility]* [address], [place] [address] [phone]  ------------------------------------------------------------------- Transesophageal Echocardiography  Patient:    Allyson, Rima MR #:       [id number] Study Date: 07/16/2017 Gender:     F Age:        56 Height: Weight: BSA: Pt. Status: Room:  ATTENDING    Antony Contras MD PERFORMING   Sanda Klein, MD ADMITTING    [place], [place] Ernestene Mention, [place]    Lyda Jester M SONOGRAPHER  Lonny Prude [place]  cc:  ------------------------------------------------------------------- LV EF: 40% -   45%  ------------------------------------------------------------------- History:   PMH:  Stroke.  Risk factors:  Ascending aortic aneurysm.  -------------------------------------------------------------------  Study Conclusions  - Left ventricle: The cavity size was mildly dilated. Systolic function was mildly to moderately reduced. The estimated ejection fraction was in the range of 40% to 45%. Hypokinesis of the inferior myocardium. No evidence of thrombus. - Aortic valve: Valve morphology is consistent with a resuspended native valve  or homograft, with evidence of leaflet prolapse and possible perforation of the right coronary cusp.. There was moderate to severe regurgitation directed eccentrically in the LVOT and towards the mitral anterior leaflet. - Aorta: The ascending aorta is replaced by a 26 mm Dacron graft. The graft is surrounded by a lerge mass of heterogenous tissue, consistent with thrombosed old aortic aneurysm lumen. Without older echo images, cannot entirely exclude a periannular abscess. - Mitral valve: No evidence of vegetation. There was mild to moderate regurgitation directed centrally. - Left atrium: No evidence of thrombus in the atrial cavity or appendage. No spontaneous echo contrast was observed. - Right atrium: No evidence of thrombus in the atrial cavity or appendage. - Atrial septum: There was a small (3 mm diameter) high secundum atrial septal defect. Doppler showed a small bidirectional, but predominantly left-to-right, atrial level shunt, in the baseline state. Tiny amount of right to left shunt is only seen very briefly at end-diastole.  Recommendations:  Comparison with old echo images would be invaluable in understanding the current pathological process.  ------------------------------------------------------------------- Study data:   Study status:  Routine.  Consent:  The risks, benefits, and alternatives to the procedure were explained to the patient and informed consent was obtained.  Procedure:  Initial setup. The patient was brought to the laboratory. Surface ECG leads were monitored. Sedation. Conscious sedation was administered. Transesophageal echocardiography. Topical anesthesia was obtained using viscous lidocaine. An adult multiplane transesophageal probe was inserted by the attending cardiologist. Image quality was adequate.  Study completion:  The patient tolerated the procedure well. There were no complications.  Administered medications: Fentanyl, 49mg, IV.   Midazolam, '5mg'$ , IV.          Diagnostic transesophageal echocardiography.  2D and color Doppler. Birthdate:  Patient birthdate: 003-13-1962  Age:  Patient is 62yr old.  Sex:  Gender: female.  Blood pressure:     119/72  Patient status:  Inpatient.  Study date:  Study date: 07/16/2017. Study time: 01:01 PM.  Location:  Endoscopy.  -------------------------------------------------------------------  ------------------------------------------------------------------- Left ventricle:  The cavity size was mildly dilated. Systolic function was mildly to moderately reduced. The estimated ejection fraction was in the range of 40% to 45%.  No evidence of thrombus. Regional wall motion abnormalities:   Hypokinesis of the inferior myocardium.  ------------------------------------------------------------------- Aortic valve:  Valve morphology is consistent with a resuspended native valve or homograft. The valve appears well seated . However, the leaflets prolapse in the LV outflow tract, especially the right coronary cusp. There are two eccentric jets, directed towards the anterior mitral leaflet. One is due to severe prolapse and possible perforation in the right coronary cusp. The other jet might be perivalvular, in the vicinity of the left coronary cusp insertion. Doppler:  There was moderate to severe regurgitation directed eccentrically in the LVOT and towards the mitral anterior leaflet.  ------------------------------------------------------------------- Aorta:  The ascending aorta is replaced by a 26 mm Dacron graft. The graft is surrounded by a lerge mass of heterogenous tissue, consistent with thrombosed old aortic aneurysm lumen. Without older echo images, cannot entirely exclude a periannular abscess. The aortic arch and descending aorta are normal in caliber, without evidence of dissection or  atherosclerotic  disease.  ------------------------------------------------------------------- Mitral valve:   Structurally normal valve.   Leaflet separation was normal.  No evidence of vegetation.  Doppler:  There was mild to moderate regurgitation directed centrally.  ------------------------------------------------------------------- Left atrium:  The atrium was normal in size.  No evidence of thrombus in the atrial cavity or appendage. No spontaneous echo contrast was observed.  ------------------------------------------------------------------- Atrial septum:  There was a small (3 mm diameter) high secundum atrial septal defect. Doppler showed a small bidirectional, but predominantly left-to-right, atrial level shunt, in the baseline state. Tiny amount of right to left shunt is only seen very briefly at end-diastole.  ------------------------------------------------------------------- Right ventricle:  The cavity size was normal. Wall thickness was normal. Systolic function was normal.  ------------------------------------------------------------------- Pulmonic valve:   Poorly visualized.  Structurally normal valve. Cusp separation was normal.  No evidence of vegetation.  ------------------------------------------------------------------- Tricuspid valve:   Structurally normal valve.   Leaflet separation was normal.  No evidence of vegetation.  Doppler:  There was no regurgitation.  ------------------------------------------------------------------- Right atrium:  The atrium was normal in size.  No evidence of thrombus in the atrial cavity or appendage.  ------------------------------------------------------------------- Pericardium:  There was no pericardial effusion.  ------------------------------------------------------------------- Post procedure conclusions Ascending Aorta:  - The ascending aorta is replaced by a 26 mm Dacron graft. The graft is surrounded by a lerge mass of  heterogenous tissue, consistent with thrombosed old aortic aneurysm lumen. Without older echo images, cannot entirely exclude a periannular abscess. The aortic arch and descending aorta are normal in caliber, without evidence of dissection or atherosclerotic disease.  ------------------------------------------------------------------- Prepared and Electronically Authenticated by  Sanda Klein, MD 2018-07-23T15:31:02              Echo [facility] 02/03/2023 (unable to review images) SUMMARY  LV ejection fraction = 45-50%.  There is mild global hypokinesis of the left ventricle.  Left ventricular filling pattern is indeterminate.  The right ventricle is mildly dilated.  The right ventricular systolic function is normal.  The right atrium is mildly dilated.  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  Dilated IVC with normal collapsibility consistent with a mildly elevated RA  pressure.  There is no pericardial effusion.   Patient Profile     62 y.o. female  with history of  ascending aortic aneurysm repair with aortic valve resuspension 2017 followed by aortic valve bioprosthetic replacement 2019, previous stroke, hypertension, hyperlipidemia, nonobstructive CAD, PFO, heart failure with mildly reduced ejection fraction, recent severe GI bleed requiring transfusion ([place], endoscopy showed hiatal hernia and severe colonic diverticulosis), new development of moderate to severe MR, now admitted with acute hypoxic respiratory failure due to heart failure exacerbation in the setting of atrial flutter with rapid ventricular response   Assessment & Plan    SCAI C Cardiogenic shock - have asked for PCCM for assistance with R IJV Central Line - will get CVP and SVO2, will then start milrinone in 1.25; may need low dose levophed with this - continue IV lasix for now - With primary data will reach out to  [facility]  AFL RVR Severe MR - planned for TEE/DCCV today; may need this done in the unit at bedside based on results - IV amiodarone - DOAC for now; hgb stable, IV iron (no further GI bleed) - I worry she will not be able to get GDMT and will end up needing mTEER  Non obstructive CAD - needs statin  Sending labs for hyponatremia (Osm) as she does not appear hypovolemic  Hx of ascending aortic repair for dissection Well functioning bioprosthetic valve  Cardiology Primary NPO for procedure: will  Full Code - Will switch to heparin for [facility] as she may need LHC/RHC through her acute care  She has given me permission to talk with her husband but not her son I have attempted to Reach Mr Neville Route [phone]); left a voicemail that we would like to be in touch about some key updates.  CRITICAL CARE Performed by: Carl Bleecker A Braxen Dobek  Total critical care time: 70 minutes. Critical care time was exclusive of separately billable procedures and treating other patients. Critical care was necessary to treat or prevent imminent or life-threatening deterioration. Critical care was time spent personally by me on the following activities: development of treatment plan with patient and/or surrogate as well as nursing, discussions with consultants, evaluation of patient's response to treatment, examination of patient, obtaining history from patient or surrogate, ordering and performing treatments and interventions, ordering and review of laboratory studies, ordering and review of radiographic studies, pulse oximetry and re-evaluation of patient's condition.    Signed, Rudean Haskell, MD [place]  02/26/2023 7:46 AM     For questions or updates, please contact [place] Please consult [email or web address] for contact info under        Signed, Werner Lean, MD  02/26/2023, 7:42 AM     Lactate rose to 3.1 Updated her son He gave [place] permission for  [facility] with possible IABP or other support device We will not do TEE/DCCV at this time I have started levophed. We have still consented for TEE/DCCV; this may need to be done in the ICU based on how she progress. Son has given a back up phone number just in case  Darnelle Maffucci276 [phone] (his office phone)  [facility] is amenable to take her to the lab.  Will hold endoscopy TEE/DCCV for now   CRITICAL CARE Performed by: Luria Rosario A Luka Reisch  Total critical care time: 30 minutes. Critical care time was exclusive of separately billable procedures and treating other patients. Critical care was necessary to treat or prevent imminent or life-threatening deterioration. Critical care was time spent personally by me on the following activities: development of treatment plan with patient and/or surrogate as well as nursing, discussions with consultants, evaluation of patient's response to treatment, examination of patient, obtaining history from patient or surrogate, ordering and performing treatments and interventions, ordering and review of laboratory studies, ordering and review of radiographic studies, pulse oximetry and re-evaluation of patient's condition.    Signed, Rudean Haskell, [place]  02/26/2023 9:47 AM

## 2023-02-26 NOTE — Progress Notes (Signed)
Heart Failure Navigator Progress Note  Assessed for Heart & Vascular TOC clinic readiness.  Patient does not meet criteria due to Advanced Heart Failure consult. .   Navigator will sign off at this time.   Earnestine Leys, BSN, Clinical cytogeneticist Only

## 2023-02-26 NOTE — Progress Notes (Signed)
SLP Cancellation Note  Patient Details Name: Stacy Jenkins MRN: [id number] DOB: 02/08/1961   Cancelled treatment:       Reason Eval/Treat Not Completed: Patient at procedure or test/unavailable- spoke with RN, will f/u for MBS as recommended. Pt may not be appropriate today.  Sarah Zerby L. Tivis Ringer, MA CCC/SLP Clinical Specialist - Acute Care SLP Acute Rehabilitation Services Office number [phone]    [name] 02/26/2023, 9:58 AM

## 2023-02-27 ENCOUNTER — Inpatient Hospital Stay (HOSPITAL_COMMUNITY): Payer: PPO

## 2023-02-27 DIAGNOSIS — R570 Cardiogenic shock: Secondary | ICD-10-CM | POA: Diagnosis not present

## 2023-02-27 DIAGNOSIS — I340 Nonrheumatic mitral (valve) insufficiency: Secondary | ICD-10-CM | POA: Diagnosis not present

## 2023-02-27 DIAGNOSIS — I5023 Acute on chronic systolic (congestive) heart failure: Secondary | ICD-10-CM | POA: Diagnosis not present

## 2023-02-27 LAB — ECHOCARDIOGRAM COMPLETE
AR max vel: 2.81 cm2
AV Area VTI: 2.74 cm2
AV Area mean vel: 2.73 cm2
AV Mean grad: 10.7 mmHg
AV Peak grad: 19.4 mmHg
Ao pk vel: 2.2 m/s
Area-P 1/2: 4.08 cm2
Height: 67 in
MV M vel: 3.96 m/s
MV Peak grad: 62.7 mmHg
S' Lateral: 5.2 cm
Weight: 2592.61 oz

## 2023-02-27 LAB — CBC
HCT: 26.5 % — ABNORMAL LOW (ref 36.0–46.0)
HCT: 28 % — ABNORMAL LOW (ref 36.0–46.0)
Hemoglobin: 8 g/dL — ABNORMAL LOW (ref 12.0–15.0)
Hemoglobin: 8.2 g/dL — ABNORMAL LOW (ref 12.0–15.0)
MCH: 22.5 pg — ABNORMAL LOW (ref 26.0–34.0)
MCH: 22.3 pg — ABNORMAL LOW (ref 26.0–34.0)
MCHC: 30.2 g/dL (ref 30.0–36.0)
MCHC: 29.3 g/dL — ABNORMAL LOW (ref 30.0–36.0)
MCV: 74.4 fL — ABNORMAL LOW (ref 80.0–100.0)
MCV: 76.3 fL — ABNORMAL LOW (ref 80.0–100.0)
Platelets: 224 10*3/uL (ref 150–400)
Platelets: 237 10*3/uL (ref 150–400)
RBC: 3.56 MIL/uL — ABNORMAL LOW (ref 3.87–5.11)
RBC: 3.67 MIL/uL — ABNORMAL LOW (ref 3.87–5.11)
RDW: 22.1 % — ABNORMAL HIGH (ref 11.5–15.5)
RDW: 22.2 % — ABNORMAL HIGH (ref 11.5–15.5)
WBC: 7.5 10*3/uL (ref 4.0–10.5)
WBC: 8.3 10*3/uL (ref 4.0–10.5)
nRBC: 0.9 % — ABNORMAL HIGH (ref 0.0–0.2)
nRBC: 1.9 % — ABNORMAL HIGH (ref 0.0–0.2)

## 2023-02-27 LAB — BASIC METABOLIC PANEL
Anion gap: 15 (ref 5–15)
Anion gap: 9 (ref 5–15)
BUN: 28 mg/dL — ABNORMAL HIGH (ref 8–23)
BUN: 25 mg/dL — ABNORMAL HIGH (ref 8–23)
CO2: 29 mmol/L (ref 22–32)
CO2: 36 mmol/L — ABNORMAL HIGH (ref 22–32)
Calcium: 7.8 mg/dL — ABNORMAL LOW (ref 8.9–10.3)
Calcium: 8.4 mg/dL — ABNORMAL LOW (ref 8.9–10.3)
Chloride: 86 mmol/L — ABNORMAL LOW (ref 98–111)
Chloride: 81 mmol/L — ABNORMAL LOW (ref 98–111)
Creatinine, Ser: 1.18 mg/dL — ABNORMAL HIGH (ref 0.44–1.00)
Creatinine, Ser: 1.27 mg/dL — ABNORMAL HIGH (ref 0.44–1.00)
GFR, Estimated: 52 mL/min — ABNORMAL LOW (ref >60)
GFR, Estimated: 48 mL/min — ABNORMAL LOW (ref >60)
Glucose, Bld: 205 mg/dL — ABNORMAL HIGH (ref 70–99)
Glucose, Bld: 199 mg/dL — ABNORMAL HIGH (ref 70–99)
Potassium: 2.7 mmol/L — CL (ref 3.5–5.1)
Potassium: 3.7 mmol/L (ref 3.5–5.1)
Sodium: 130 mmol/L — ABNORMAL LOW (ref 135–145)
Sodium: 126 mmol/L — ABNORMAL LOW (ref 135–145)

## 2023-02-27 LAB — COOXEMETRY PANEL
Carboxyhemoglobin: 1.7 % — ABNORMAL HIGH (ref 0.5–1.5)
Methemoglobin: <0.7 % (ref 0.0–1.5)
O2 Saturation: 54.4 %
Total hemoglobin: 8.1 g/dL — ABNORMAL LOW (ref 12.0–16.0)

## 2023-02-27 LAB — HEPARIN LEVEL (UNFRACTIONATED)
Heparin Unfractionated: >1.1 IU/mL — ABNORMAL HIGH (ref 0.30–0.70)
Heparin Unfractionated: >1.1 IU/mL — ABNORMAL HIGH (ref 0.30–0.70)

## 2023-02-27 LAB — GLUCOSE, CAPILLARY
Glucose-Capillary: 190 mg/dL — ABNORMAL HIGH (ref 70–99)
Glucose-Capillary: 115 mg/dL — ABNORMAL HIGH (ref 70–99)
Glucose-Capillary: 68 mg/dL — ABNORMAL LOW (ref 70–99)
Glucose-Capillary: 201 mg/dL — ABNORMAL HIGH (ref 70–99)

## 2023-02-27 LAB — APTT
aPTT: >200 seconds (ref 24–36)
aPTT: 71 seconds — ABNORMAL HIGH (ref 24–36)

## 2023-02-27 MED ORDER — INSULIN ASPART 100 UNIT/ML IJ SOLN: 0.0000 [IU] | Freq: Every day | INTRAMUSCULAR | Status: DC

## 2023-02-27 MED ORDER — HEPARIN (PORCINE) 25000 UT/250ML-% IV SOLN
800.0000 [IU]/h | INTRAVENOUS | Status: DC
  Administered 2023-02-27 (×2): 800 [IU]/h via INTRAVENOUS
  Filled 2023-02-27: qty 250

## 2023-02-27 MED ORDER — POTASSIUM CHLORIDE 10 MEQ/50ML IV SOLN
10.0000 meq | INTRAVENOUS | Status: AC
  Administered 2023-02-27 (×8): 10 meq via INTRAVENOUS
  Filled 2023-02-27 (×8): qty 50

## 2023-02-27 MED ORDER — INSULIN ASPART 100 UNIT/ML IJ SOLN
0.0000 [IU] | Freq: Three times a day (TID) | INTRAMUSCULAR | Status: DC
  Administered 2023-02-27 – 2023-02-28 (×3): 3 [IU] via SUBCUTANEOUS
  Administered 2023-03-01: 2 [IU] via SUBCUTANEOUS
  Administered 2023-03-01: 3 [IU] via SUBCUTANEOUS
  Administered 2023-03-02 (×2): 2 [IU] via SUBCUTANEOUS
  Administered 2023-03-04 – 2023-03-07 (×5): 3 [IU] via SUBCUTANEOUS

## 2023-02-27 MED ORDER — POTASSIUM CHLORIDE 20 MEQ PO PACK
40.0000 meq | PACK | Freq: Two times a day (BID) | ORAL | Status: DC
  Administered 2023-02-27 – 2023-03-01 (×4): 40 meq via ORAL
  Filled 2023-02-27 (×4): qty 2

## 2023-02-27 MED ORDER — SPIRONOLACTONE 25 MG PO TABS
25.0000 mg | ORAL_TABLET | Freq: Every day | ORAL | Status: DC
  Administered 2023-02-27: 25 mg via ORAL
  Filled 2023-02-27 (×2): qty 1

## 2023-02-27 MED ORDER — DEXTROSE 5 % IV SOLN
500.0000 mg | Freq: Once | INTRAVENOUS | Status: AC
  Administered 2023-02-27: 500 mg via INTRAVENOUS
  Filled 2023-02-27: qty 500

## 2023-02-27 MED FILL — Lidocaine HCl Local Preservative Free (PF) Inj 1%: INTRAMUSCULAR | Qty: 30 | Status: AC

## 2023-02-27 NOTE — Progress Notes (Signed)
ANTICOAGULATION CONSULT NOTE - Follow Up Consult  Pharmacy Consult for heparin Indication: atrial fibrillation  Labs: Recent Labs    02/25/23 1259 02/26/23 0023 02/26/23 0023 02/26/23 1053 02/26/23 1140 02/27/23 0214 02/27/23 1301  HGB  --  8.4*   < > 10.5*  10.5*  --  8.0* 8.2*  HCT  --  27.8*   < > 31.0*  31.0*  --  26.5* 28.0*  PLT  --  245  --   --   --  224 237  APTT  --   --   --   --   --  >200* 71*  LABPROT  --   --   --   --  25.6*  --   --   INR  --   --   --   --  2.4*  --   --   HEPARINUNFRC  --   --   --   --   --  >1.10* >1.10*  CREATININE 1.30* 1.08*  --   --   --  1.18*  --    < > = values in this interval not displayed.     Assessment: 62yo female with Hx Afib on apixaban PTA and admitted with HF exacerbation. Hold apixaban for now for planned procedures  Initial aptt was supratherapeutic on heparin  Heparin drip held for 102mn and drip rate decreased 800 uts/hr  Follow up aptt 70sec at goal. Heparin leve elevated in setting of apixaban use causes false elevation of heparin level  - use aptt for heparin dosing   no infusion issues or signs of bleeding per RN.  Of note Hgb ~10 fell to 8 overnight but remains 8 on recheck CBC this afternoon.  previous CBCs run by lab had Hgb in the 8 range and the hgb 10 was from I-stat  Goal of Therapy:  aPTT 66-102 seconds   Plan:  Continue heparin drip  800 units/hr  Daily aptt and cbc  Monitor s/s bleeding     LBonnita NasutiPharm.D. CPP, BCPS Clinical Pharmacist [phone]/04/2023 3:06 PM

## 2023-02-27 NOTE — Progress Notes (Addendum)
Advanced Heart Failure Rounding Note  PCP-Cardiologist: [name], MD   Subjective:    3/4 S/P RHC RA 15, PA 60/31 (40)PCWP 30>50, Fick CO/CI 5.3 CI 2.8 . Started on Norepi 5 mcg and diuresed with IV lasix.   Lactic acid 3.4>1.7>1.4   Currently on Norepi 4 mcg+ amio 60 mcg. CO-OX pending.   Brisk diuresis -2.9 liters.    Denies SOB. Difficulty swallowing.     Objective:   Weight Range: 73.5 kg Body mass index is 25.38 kg/m.   Vital Signs:   Temp:  [97.6 F (36.4 C)-97.9 F (36.6 C)] 97.8 F (36.6 C) (03/05 0300) Pulse Rate:  [67-108] 78 (03/05 0700) Resp:  [14-24] 16 (03/05 0700) BP: (80-107)/(48-81) 89/71 (03/05 0700) SpO2:  [91 %-100 %] 97 % (03/05 0700) FiO2 (%):  [30 %] 30 % (03/04 2332) Weight:  [73.5 kg] 73.5 kg (03/05 0400) Last BM Date : 02/23/23  Weight change: Filed Weights   02/25/23 0434 02/26/23 0500 02/27/23 0400  Weight: 76.5 kg 76.4 kg 73.5 kg    Intake/Output:   Intake/Output Summary (Last 24 hours) at 02/27/2023 0738 Last data filed at 02/27/2023 0700 Gross per 24 hour  Intake 1390.31 ml  Output 4345 ml  Net -2954.69 ml    CVP 17-18   Physical Exam    General:   No resp difficulty HEENT: Normal Neck: Supple. JVP to jaw . Carotids 2+ bilat; no bruits. No lymphadenopathy or thyromegaly appreciated. Cor: PMI nondisplaced. Irregular rate & rhythm. No rubs, gallops or murmurs. Lungs: Decreased in the bases.  Abdomen: Soft, nontender, nondistended. No hepatosplenomegaly. No bruits or masses. Good bowel sounds. Extremities: No cyanosis, clubbing, rash, edema. RUE PICC Neuro: Alert & orientedx3, cranial nerves grossly intact. moves all 4 extremities w/o difficulty. Affect flat    Telemetry   A fib 60-90s personally checked.   EKG    N/A  Labs    CBC Recent Labs    02/26/23 0023 02/26/23 1053 02/27/23 0214  WBC 6.5  --  7.5  HGB 8.4* 10.5*  10.5* 8.0*  HCT 27.8* 31.0*  31.0* 26.5*  MCV 74.9*  --  74.4*  PLT 245   --  [id number]   Basic Metabolic Panel Recent Labs    02/24/23 1821 02/25/23 0129 02/26/23 0023 02/26/23 0832 02/26/23 1053 02/27/23 0214  NA 131*   < > 128*  --  130*  130* 130*  K 3.8   < > 3.6  --  3.8  3.8 2.7*  CL 94*   < > 90*  --   --  86*  CO2 25   < > 27  --   --  29  GLUCOSE 167*   < > 147*  --   --  205*  BUN 34*   < > 34*  --   --  28*  CREATININE 1.27*   < > 1.08*  --   --  1.18*  CALCIUM 8.4*   < > 8.2*  --   --  7.8*  MG 2.1  --   --  2.2  --   --    < > = values in this interval not displayed.   Liver Function Tests Recent Labs    02/26/23 1140  AST 223*  ALT 200*  ALKPHOS 193*  BILITOT 1.9*  PROT 6.3*  ALBUMIN 3.5   No results for input(s): "LIPASE", "AMYLASE" in the last 72 hours. Cardiac Enzymes No results for input(s): "CKTOTAL", "CKMB", "  CKMBINDEX", "TROPONINI" in the last 72 hours.  BNP: BNP (last 3 results) Recent Labs    02/22/23 1534  BNP 876.3*    ProBNP (last 3 results) Recent Labs    02/21/23 1203  PROBNP 4,604*     D-Dimer No results for input(s): "DDIMER" in the last 72 hours. Hemoglobin A1C No results for input(s): "HGBA1C" in the last 72 hours. Fasting Lipid Panel No results for input(s): "CHOL", "HDL", "LDLCALC", "TRIG", "CHOLHDL", "LDLDIRECT" in the last 72 hours. Thyroid Function Tests No results for input(s): "TSH", "T4TOTAL", "T3FREE", "THYROIDAB" in the last 72 hours.  Invalid input(s): "FREET3"  Other results:   Imaging    CARDIAC CATHETERIZATION  Result Date: 02/26/2023 HEMODYNAMICS: On levophed 94mg RA:   15 mmHg (mean) RV:   52/3-15 mmHg L-PA:   60/31 mmHg (40 mean) R-PA             60s/30s L-PCWP:  30 mmHg with V waves to 50 R-PCWP 25-30 with 267mg V waves   Estimated Fick CO/CI: 5.3 L/min, 2.8 L/min/m2 Thermodilution CO/CI: 4.2 L/min, 2.2 L/min/m2    TPG    10  mmHg     PVR     1.87-2.39 Wood Units PAPi      1.9  IMPRESSION: Severely elevated pre and post capillary filling pressures. Mildly reduced cardiac  output by thermodilution; preserved by Fick. Mildly elevated PVR. Large V waves in both R-PA & L-PA consistent with underlying severe mitral regurgitation RECOMMENDATIONS: Obtain TTE Start aggressive IV diuresis. Aditya Sabharwal 11:32 AM  [place] SITE RITE  Result Date: 02/26/2023 If Site Rite image not attached, placement could not be confirmed due to current cardiac rhythm.  [place] SITE RITE  Result Date: 02/26/2023 If Site Rite image not attached, placement could not be confirmed due to current cardiac rhythm.    Medications:     Scheduled Medications:  Chlorhexidine Gluconate Cloth  6 each Topical Daily   furosemide  80 mg Intravenous BID   mouth rinse  15 mL Mouth Rinse 4 times per day   potassium chloride  20 mEq Oral BID   rosuvastatin  5 mg Oral Daily   sodium chloride flush  10-40 mL Intracatheter Q12H   sodium chloride flush  3 mL Intravenous Q12H   sodium chloride flush  3 mL Intravenous Q12H   sodium chloride flush  3 mL Intravenous Q12H    Infusions:  sodium chloride     sodium chloride     sodium chloride     amiodarone 60 mg/hr (02/27/23 0700)   heparin 800 Units/hr (02/27/23 0700)   norepinephrine (LEVOPHED) Adult infusion 4 mcg/min (02/27/23 0700)   potassium chloride Stopped (02/27/23 0651)    PRN Medications: sodium chloride, sodium chloride, acetaminophen, LORazepam, ondansetron (ZOFRAN) IV, mouth rinse, sodium chloride flush, sodium chloride flush, sodium chloride flush, traZODone    Patient Profile   Mrs PhHallorans a [age]ear old with a history of CVA, hypertension, hyperlipidemia, minimal CAD, patent foramen ovale, chronic systolic heart failure, 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    Admitted 02/22/2023 for the evaluation of dyspnea.   Assessment/Plan     1.  Shock --> Suspect Cardiogenic -[facility]. Concerned EF  will be worse due to ongoing A fib RVR.  Persistent hypotension with elevated lactic acid. CO2 27 SCAI C. Advanced Heart Failure Team Consulted  Lactic acid clearing 3.4>1.4  RHC elevated filling pressures CO 5.3 CI 2.8 t - Remains on Norepi 4 mcg. Check CO-OX  - CVP 17-18. Continue lasix IV 80 mg twice a day. If swallowing ok will add metolazone.  - Give Diuril 500 mg  - Supp K  - Renal function stable.    2. A fib RVR  Rate controlled. Cut back amio to 30 mg per hour.  - Continue heparin drip TEE/DC-CV after diuresed.   Supp K    3. Acute Hypoxic Respiratory Failure ,  OSA- on Bipap  - Sats stable HFNC 6 liters.   - CCM following.    4. MR  Will need TEE to further assess once diuresed.    5.  Anemia  -Recent GI bleed 01/2023  -2024 EGD/Colonoscopy with diverticulosis.  -Hgb trending down 10.5>8.  -Check FOBT.  - CBC in am.    6. CAD 2019 Non obstructive CAD-->10% left main, <25% mid LAD, <25% RCA lesion.    7. H/O Bioprosthetic AVR 2019    8. DMII  -Hgb A1C 8 -Add SSI    9. H/O CVA     Speech Therapy consulted for swallowing issues.   Consult PT.  Length of Stay: 5  Kaiyon Hynes, NP  02/27/2023, 7:38 AM  Advanced Heart Failure Team Pager [phone] (M-F; 7a - 5p)  Please contact [facility] for night-coverage after hours (5p -7a ) and weekends on [email or web address]

## 2023-02-27 NOTE — Progress Notes (Signed)
[facility] ADULT ICU REPLACEMENT PROTOCOL   The patient does apply for the [facility] Adult ICU Electrolyte Replacment Protocol based on the criteria listed below:   1.Exclusion criteria: TCTS, ECMO, Dialysis, and Myasthenia Gravis patients 2. Is GFR >/= 30 ml/min? Yes.    Patient's GFR today is 52 3. Is SCr </= 2? Yes.   Patient's SCr is 1.18 mg/dL 4. Did SCr increase >/= 0.5 in 24 hours? No. 5.Pt's weight >40kg  Yes.   6. Abnormal electrolyte(s): k 2.7  7. Electrolytes replaced per protocol 8.  Call MD STAT for K+ </= 2.5, Phos </= 1, or Mag </= 1 Physician:  Constance Holster, Desere Gwin A 02/27/2023 3:33 AM

## 2023-02-27 NOTE — Progress Notes (Signed)
Speech Language Pathology Treatment: Dysphagia  Patient Details Name: Stacy Jenkins MRN: [id number] DOB: [birth year] Today's Date: 02/27/2023 Time: [id number] SLP Time Calculation (min) (ACUTE ONLY): 11 min  Assessment / Plan / Recommendation Clinical Impression  MBS still pending (fluoroscopy suite is undergoing repairs today); pt is scheduled for tomorrow.  She was seated in recliner upon entering room. RN reports ongoing coughing throughout meals. During our session, perhaps due to optimal positioning in chair and limited POs, she demonstrated overall adequate swallowing with no overt s/s of aspiration. Frequent belching remained present. BS are diminished; last CXR on 3/3 showed no significant change from prior imaging (small pleural effusions). Asked RN to Secure Chat this SLP if coughing recurs with PO intake. In the interim, continue current diet. Will still plan for MBS given recurring clinical s/s of aspiration.    HPI HPI: Stacy Jenkins is a 62 y.o. 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  She was diagnosed with acute respiratory, acute on chronic systolic heart failure and afib with RVR.  Chest xray was showing moderate pulmonary edema with small right pleural effusion.      SLP Plan  Continue with current plan of care;MBS      Recommendations for follow up therapy are one component of a multi-disciplinary discharge planning process, led by the attending physician.  Recommendations may be updated based on patient status, additional functional criteria and insurance authorization.    Recommendations  Diet recommendations: Regular;Thin liquid Liquids provided via: Cup;Straw Medication Administration: Whole meds with  liquid Supervision: Patient able to self feed                Oral Care Recommendations: Oral care BID Follow Up Recommendations: Other (comment) (tba) Plan: Continue with current plan of care;MBS        Stacy Jenkins L. Stacy Ringer, MA CCC/SLP Clinical Specialist - Acute Care SLP Acute Rehabilitation Services Office number [phone]   Stacy Jenkins Stacy Jenkins  02/27/2023, 10:32 AM

## 2023-02-27 NOTE — Progress Notes (Addendum)
Rounding Note    Patient Name: Stacy Jenkins Date of Encounter: 02/27/2023  [place] Cardiologist: [name], MD   Subjective   Since high volume diuresis, patient has improved. She notes that at baseline, she walks a bit, is relatively sedentary. No GI bleeding; no BM. Failed swallow eval, pending Barium Swallow. Has been getting IV K because of swallowing issues  Inpatient Medications    Scheduled Meds:  Chlorhexidine Gluconate Cloth  6 each Topical Daily   furosemide  80 mg Intravenous BID   mouth rinse  15 mL Mouth Rinse 4 times per day   potassium chloride  20 mEq Oral BID   rosuvastatin  5 mg Oral Daily   sodium chloride flush  10-40 mL Intracatheter Q12H   sodium chloride flush  3 mL Intravenous Q12H   sodium chloride flush  3 mL Intravenous Q12H   sodium chloride flush  3 mL Intravenous Q12H   spironolactone  25 mg Oral Daily   Continuous Infusions:  sodium chloride     sodium chloride     sodium chloride     amiodarone 60 mg/hr (02/27/23 0700)   heparin 800 Units/hr (02/27/23 0700)   norepinephrine (LEVOPHED) Adult infusion 4 mcg/min (02/27/23 0700)   potassium chloride Stopped (02/27/23 0651)   PRN Meds: sodium chloride, sodium chloride, acetaminophen, LORazepam, ondansetron (ZOFRAN) IV, mouth rinse, sodium chloride flush, sodium chloride flush, sodium chloride flush, traZODone   Vital Signs    Vitals:   02/27/23 0630 02/27/23 0645 02/27/23 0700 02/27/23 0757  BP: 94/60 90/71 (!) 89/71   Pulse: 81 80 78   Resp: '15 14 16   '$ Temp:    97.6 F (36.4 C)  TempSrc:    Oral  SpO2: 95% 96% 97%   Weight:      Height:        Intake/Output Summary (Last 24 hours) at 02/27/2023 0803 Last data filed at 02/27/2023 0700 Gross per 24 hour  Intake 1390.31 ml  Output 4345 ml  Net -2954.69 ml      02/27/2023    4:00 AM 02/26/2023    5:00 AM 02/25/2023    4:34 AM  Last 3 Weights  Weight (lbs) 162 lb 0.6 oz 168 lb 6.9 oz 168 lb 10.4 oz  Weight  (kg) 73.5 kg 76.4 kg 76.5 kg      Telemetry    AFL  rare controled.- Personally Reviewed  ECG    No new- Personally Reviewed  Physical Exam   Gen: mild distress, thin female Cardiac: No Rubs or Gallops,  IV holosystolic murmur, regular rhythm Respiratory: Clear to auscultation bilaterally, normal respiratory rate and effort GI: Soft, nontender, non-distended  MS: Legs are wrapped, extremities are warmer Neuro:  At time of evaluation, alert and oriented to person/place/time   Labs    High Sensitivity Troponin:   Recent Labs  Lab 02/22/23 1534 02/22/23 1701  TROPONINIHS 45* 44*     Chemistry Recent Labs  Lab 02/21/23 1203 02/22/23 1534 02/22/23 1552 02/24/23 0101 02/24/23 1028 02/24/23 1821 02/25/23 0129 02/25/23 1259 02/26/23 0023 02/26/23 0832 02/26/23 1053 02/26/23 1140 02/27/23 0214  NA 140 137   < > 135  --  131*   < > 131* 128*  --  130*  130*  --  130*  K 4.2 4.2   < > 2.7*   < > 3.8   < > 3.6 3.6  --  3.8  3.8  --  2.7*  CL 103  105   < > 97*  --  94*   < > 93* 90*  --   --   --  86*  CO2 22 20*   < > 25  --  25   < > 26 27  --   --   --  29  GLUCOSE 114* 129*   < > 131*  --  167*   < > 221* 147*  --   --   --  205*  BUN 22 28*   < > 30*  --  34*   < > 33* 34*  --   --   --  28*  CREATININE 0.80 0.85   < > 0.95  --  1.27*   < > 1.30* 1.08*  --   --   --  1.18*  CALCIUM 9.3 8.8*   < > 8.2*  --  8.4*   < > 8.3* 8.2*  --   --   --  7.8*  MG  --   --   --  1.9  --  2.1  --   --   --  2.2  --   --   --   PROT 6.7 6.7  --   --   --   --   --   --   --   --   --  6.3*  --   ALBUMIN 4.4 3.8  --   --   --   --   --   --   --   --   --  3.5  --   AST 29 39  --   --   --   --   --   --   --   --   --  223*  --   ALT 38* 43  --   --   --   --   --   --   --   --   --  200*  --   ALKPHOS 72 59  --   --   --   --   --   --   --   --   --  193*  --   BILITOT 0.9 1.6*  --   --   --   --   --   --   --   --   --  1.9*  --   GFRNONAA  --  >60   < > >60  --  48*   < >  46* 58*  --   --   --  52*  ANIONGAP  --  12   < > 13  --  12   < > 12 11  --   --   --  15   < > = values in this interval not displayed.    Lipids No results for input(s): "CHOL", "TRIG", "HDL", "LABVLDL", "LDLCALC", "CHOLHDL" in the last 168 hours.  Hematology Recent Labs  Lab 02/22/23 1534 02/22/23 1552 02/26/23 0023 02/26/23 0832 02/26/23 1053 02/27/23 0214  WBC 4.7  --  6.5  --   --  7.5  RBC 3.88  --  3.71* 3.87  --  3.56*  HGB 8.7*   < > 8.4*  --  10.5*  10.5* 8.0*  HCT 31.1*   < > 27.8*  --  31.0*  31.0* 26.5*  MCV 80.2  --  74.9*  --   --  74.4*  MCH 22.4*  --  22.6*  --   --  22.5*  MCHC 28.0*  --  30.2  --   --  30.2  RDW 23.4*  --  22.3*  --   --  22.1*  PLT 316  --  245  --   --  224   < > = values in this interval not displayed.   Thyroid No results for input(s): "TSH", "FREET4" in the last 168 hours.  BNP Recent Labs  Lab 02/21/23 1203 02/22/23 1534  BNP  --  876.3*  PROBNP 4,604*  --     DDimer No results for input(s): "DDIMER" in the last 168 hours.   Radiology    CARDIAC CATHETERIZATION  Result Date: 02/26/2023 HEMODYNAMICS: On levophed 73mg RA:   15 mmHg (mean) RV:   52/3-15 mmHg L-PA:   60/31 mmHg (40 mean) R-PA             60s/30s L-PCWP:  30 mmHg with V waves to 50 R-PCWP 25-30 with 258mg V waves   Estimated Fick CO/CI: 5.3 L/min, 2.8 L/min/m2 Thermodilution CO/CI: 4.2 L/min, 2.2 L/min/m2    TPG    10  mmHg     PVR     1.87-2.39 Wood Units PAPi      1.9  IMPRESSION: Severely elevated pre and post capillary filling pressures. Mildly reduced cardiac output by thermodilution; preserved by Fick. Mildly elevated PVR. Large V waves in both R-PA & L-PA consistent with underlying severe mitral regurgitation RECOMMENDATIONS: Obtain TTE Start aggressive IV diuresis. Aditya Sabharwal 11:32 AM  [place] SITE RITE  Result Date: 02/26/2023 If Site Rite image not attached, placement could not be confirmed due to current cardiac rhythm.  [place] SITE RITE  Result  Date: 02/26/2023 If Site Rite image not attached, placement could not be confirmed due to current cardiac rhythm.  DG Chest Port 1 View  Result Date: 02/25/2023 CLINICAL DATA:  Pulmonary edema EXAM: PORTABLE CHEST 1 VIEW COMPARISON:  X-ray 02/22/2023 FINDINGS: Stable enlarged cardiopericardial silhouette tortuous aorta. Status post median sternotomy. Status post TAVR. Small pleural effusions, right-greater-than-left with some adjacent opacity. Vascular congestion. No pneumothorax. Overlapping cardiac leads. Curvature of the spine. Surgical clips along the right upper chest. IMPRESSION: No significant interval change Electronically Signed   By: AsJill Side.D.   On: 02/25/2023 11:40    Cardiac Studies    Cardiac Studies & Procedures   CARDIAC CATHETERIZATION  CARDIAC CATHETERIZATION 02/26/2023  Narrative HEMODYNAMICS: On levophed 32m46mRA:   15 mmHg (mean) RV:   52/3-15 mmHg L-PA:   60/31 mmHg (40 mean) R-PA             60s/30s L-PCWP:  30 mmHg with V waves to 50 R-PCWP 25-30 with 52m47mV waves  Estimated Fick CO/CI: 5.3 L/min, 2.8 L/min/m2 Thermodilution CO/CI: 4.2 L/min, 2.2 L/min/m2  TPG    10  mmHg PVR     1.87-2.39 Wood Units PAPi      1.9   IMPRESSION: Severely elevated pre and post capillary filling pressures. Mildly reduced cardiac output by thermodilution; preserved by Fick. Mildly elevated PVR. Large V waves in both R-PA & L-PA consistent with underlying severe mitral regurgitation  RECOMMENDATIONS: Obtain TTE Start aggressive IV diuresis.  AditLindalou Hoseharwal 11:32 AM     ECHOCARDIOGRAM  ECHOCARDIOGRAM COMPLETE 08/04/2022  Narrative ECHOCARDIOGRAM REPORT    Patient Name:   BRENCAMALA BONIFASe of Exam: 08/04/2022 Medical Rec #:  [id number]   Height:  68.0 in Accession #:    [id number]      Weight:       168.8 lb Date of Birth:  [birth year]       BSA:          1.902 m Patient Age:    65 years        BP:           98/62 mmHg Patient Gender: F                HR:           71 bpm. Exam Location:  [facility]  Procedure: 2D Echo, Cardiac Doppler, Color Doppler and Strain Analysis  Indications:    Aortic valve insufficiency, etiology of cardiac valve disease unspecified [I35.1 (ICD-10-CM)]; Ischemic cardiomyopathy [I25.5 (ICD-10-CM)]; Benign essential hypertension [I10 (ICD-10-CM)]; History of atrial fibrillation [Z86.79 (ICD-10-CM)]; History of CVA (cerebrovascular accident) [Z86.73 (ICD-10-CM)]; S/P ascending aortic aneurysm repair [id number], Z86.79 (ICD-10-CM)]  History:        Patient has prior history of Echocardiogram examinations, most recent 03/22/2021. Cardiomyopathy, Stroke, Aortic Valve Disease; Risk Factors:Hypertension. S/P ascending aortic aneurysm repair. Aortic Valve: 21 mm Sorin valve is present in the aortic position.  Sonographer:    Philipp Deputy RDCS Referring Phys: [place]   1. Left ventricular ejection fraction, by estimation, is 50 to 55%. The left ventricle has low normal function. The left ventricle has no regional wall motion abnormalities. Left ventricular diastolic parameters are consistent with Grade I diastolic dysfunction (impaired relaxation). 2. Right ventricular systolic function is normal. The right ventricular size is normal. There is normal pulmonary artery systolic pressure. 3. Left atrial size was mildly dilated. 4. The mitral valve is normal in structure. Moderate mitral valve regurgitation. No evidence of mitral stenosis. 5. Prosthetic valve. Satisfactory function.. The aortic valve was not well visualized. Aortic valve regurgitation is not visualized. No aortic stenosis is present. There 6. The inferior vena cava is normal in size with greater than 50% respiratory variability, suggesting right atrial pressure of 3 mmHg.  FINDINGS Left Ventricle: Left ventricular ejection fraction, by estimation, is 50 to 55%. The left ventricle has low normal function. The left  ventricle has no regional wall motion abnormalities. The left ventricular internal cavity size was normal in size. There is no left ventricular hypertrophy. Left ventricular diastolic parameters are consistent with Grade I diastolic dysfunction (impaired relaxation).  Right Ventricle: The right ventricular size is normal. No increase in right ventricular wall thickness. Right ventricular systolic function is normal. There is normal pulmonary artery systolic pressure. The tricuspid regurgitant velocity is 1.81 m/s, and with an assumed right atrial pressure of 3 mmHg, the estimated right ventricular systolic pressure is [id number] mmHg.  Left Atrium: Left atrial size was mildly dilated.  Right Atrium: Right atrial size was normal in size.  Pericardium: There is no evidence of pericardial effusion.  Mitral Valve: The mitral valve is normal in structure. Moderate mitral valve regurgitation. No evidence of mitral valve stenosis.  Tricuspid Valve: The tricuspid valve is normal in structure. Tricuspid valve regurgitation is not demonstrated. No evidence of tricuspid stenosis.  Aortic Valve: Prosthetic valve. Satisfactory function. The aortic valve was not well visualized. Aortic valve regurgitation is not visualized. No aortic stenosis is present. Aortic valve mean gradient measures 14.5 mmHg. Aortic valve peak gradient measures 23.1 mmHg. Aortic valve area, by VTI measures 0.83 cm. There is a 21 mm Sorin valve present in the aortic position.  Pulmonic  Valve: The pulmonic valve was normal in structure. Pulmonic valve regurgitation is not visualized. No evidence of pulmonic stenosis.  Aorta: The aortic root is normal in size and structure.  Venous: The inferior vena cava is normal in size with greater than 50% respiratory variability, suggesting right atrial pressure of 3 mmHg.  IAS/Shunts: No atrial level shunt detected by color flow Doppler.   LEFT VENTRICLE PLAX 2D LVIDd:         6.10 cm    Diastology LVIDs:         4.60 cm   LV e' medial:    5.98 cm/s LV PW:         0.80 cm   LV E/e' medial:  6.1 LV IVS:        0.70 cm   LV e' lateral:   8.81 cm/s LVOT diam:     2.00 cm   LV E/e' lateral: 4.1 LV SV:         40 LV SV Index:   21 LVOT Area:     3.14 cm   RIGHT VENTRICLE            IVC RV Basal diam:  2.80 cm    IVC diam: 1.50 cm RV S prime:     8.38 cm/s TAPSE (M-mode): 1.9 cm  LEFT ATRIUM           Index        RIGHT ATRIUM           Index LA diam:      3.50 cm 1.84 cm/m   RA Area:     14.60 cm LA Vol (A2C): 39.9 ml 20.98 ml/m  RA Volume:   34.80 ml  18.30 ml/m LA Vol (A4C): 39.8 ml 20.93 ml/m AORTIC VALVE AV Area (Vmax):    0.88 cm AV Area (Vmean):   0.82 cm AV Area (VTI):     0.83 cm AV Vmax:           240.50 cm/s AV Vmean:          181.500 cm/s AV VTI:            0.486 m AV Peak Grad:      23.1 mmHg AV Mean Grad:      14.5 mmHg LVOT Vmax:         67.50 cm/s LVOT Vmean:        47.300 cm/s LVOT VTI:          0.128 m LVOT/AV VTI ratio: 0.26  AORTA Ao Root diam: 3.30 cm Ao Asc diam:  3.10 cm Ao Desc diam: 2.60 cm  MITRAL VALVE               TRICUSPID VALVE MV Area (PHT): 3.27 cm    TR Peak grad:   13.1 mmHg MV Decel Time: 232 msec    TR Vmax:        181.00 cm/s MR Peak grad: 104.7 mmHg MR Mean grad: 68.0 mmHg    SHUNTS MR Vmax:      511.50 cm/s  Systemic VTI:  0.13 m MR Vmean:     393.0 cm/s   Systemic Diam: 2.00 cm MV E velocity: 36.20 cm/s MV A velocity: 48.00 cm/s MV E/A ratio:  0.75  Jyl Heinz MD Electronically signed by Jyl Heinz MD Signature Date/Time: 08/04/2022/4:10:34 PM    Final   TEE  ECHO TEE 07/16/2017  Narrative *[facility]* [address] [place],  [place] [address] [phone]  ------------------------------------------------------------------- Transesophageal Echocardiography  Patient:    Elona, Grzeskowiak MR #:       [id number] Study Date: 07/16/2017 Gender:      F Age:        55 Height: Weight: BSA: Pt. Status: Room:  ATTENDING    Antony Contras MD PERFORMING   Sanda Klein, MD ADMITTING    [place], [place] Ernestene Mention, [place]    Lyda Jester M SONOGRAPHER  Lonny Prude [place]  cc:  ------------------------------------------------------------------- LV EF: 40% -   45%  ------------------------------------------------------------------- History:   PMH:  Stroke.  Risk factors:  Ascending aortic aneurysm.  ------------------------------------------------------------------- Study Conclusions  - Left ventricle: The cavity size was mildly dilated. Systolic function was mildly to moderately reduced. The estimated ejection fraction was in the range of 40% to 45%. Hypokinesis of the inferior myocardium. No evidence of thrombus. - Aortic valve: Valve morphology is consistent with a resuspended native valve or homograft, with evidence of leaflet prolapse and possible perforation of the right coronary cusp.. There was moderate to severe regurgitation directed eccentrically in the LVOT and towards the mitral anterior leaflet. - Aorta: The ascending aorta is replaced by a 26 mm Dacron graft. The graft is surrounded by a lerge mass of heterogenous tissue, consistent with thrombosed old aortic aneurysm lumen. Without older echo images, cannot entirely exclude a periannular abscess. - Mitral valve: No evidence of vegetation. There was mild to moderate regurgitation directed centrally. - Left atrium: No evidence of thrombus in the atrial cavity or appendage. No spontaneous echo contrast was observed. - Right atrium: No evidence of thrombus in the atrial cavity or appendage. - Atrial septum: There was a small (3 mm diameter) high secundum atrial septal defect. Doppler showed a small bidirectional, but predominantly left-to-right, atrial level shunt, in the baseline state. Tiny amount of right to left shunt is  only seen very briefly at end-diastole.  Recommendations:  Comparison with old echo images would be invaluable in understanding the current pathological process.  ------------------------------------------------------------------- Study data:   Study status:  Routine.  Consent:  The risks, benefits, and alternatives to the procedure were explained to the patient and informed consent was obtained.  Procedure:  Initial setup. The patient was brought to the laboratory. Surface ECG leads were monitored. Sedation. Conscious sedation was administered. Transesophageal echocardiography. Topical anesthesia was obtained using viscous lidocaine. An adult multiplane transesophageal probe was inserted by the attending cardiologist. Image quality was adequate.  Study completion:  The patient tolerated the procedure well. There were no complications.  Administered medications: Fentanyl, 33mg, IV.  Midazolam, '5mg'$ , IV.          Diagnostic transesophageal echocardiography.  2D and color Doppler. Birthdate:  Patient birthdate: 0Sep 25, 1962  Age:  Patient is 62yr old.  Sex:  Gender: female.  Blood pressure:     119/72  Patient status:  Inpatient.  Study date:  Study date: 07/16/2017. Study time: 01:01 PM.  Location:  Endoscopy.  -------------------------------------------------------------------  ------------------------------------------------------------------- Left ventricle:  The cavity size was mildly dilated. Systolic function was mildly to moderately reduced. The estimated ejection fraction was in the range of 40% to 45%.  No evidence of thrombus. Regional wall motion abnormalities:   Hypokinesis of the inferior myocardium.  ------------------------------------------------------------------- Aortic valve:  Valve morphology is consistent with a resuspended native valve or homograft. The valve appears well seated . However, the leaflets prolapse in the LV outflow tract, especially the  right coronary cusp. There  are two eccentric jets, directed towards the anterior mitral leaflet. One is due to severe prolapse and possible perforation in the right coronary cusp. The other jet might be perivalvular, in the vicinity of the left coronary cusp insertion. Doppler:  There was moderate to severe regurgitation directed eccentrically in the LVOT and towards the mitral anterior leaflet.  ------------------------------------------------------------------- Aorta:  The ascending aorta is replaced by a 26 mm Dacron graft. The graft is surrounded by a lerge mass of heterogenous tissue, consistent with thrombosed old aortic aneurysm lumen. Without older echo images, cannot entirely exclude a periannular abscess. The aortic arch and descending aorta are normal in caliber, without evidence of dissection or atherosclerotic disease.  ------------------------------------------------------------------- Mitral valve:   Structurally normal valve.   Leaflet separation was normal.  No evidence of vegetation.  Doppler:  There was mild to moderate regurgitation directed centrally.  ------------------------------------------------------------------- Left atrium:  The atrium was normal in size.  No evidence of thrombus in the atrial cavity or appendage. No spontaneous echo contrast was observed.  ------------------------------------------------------------------- Atrial septum:  There was a small (3 mm diameter) high secundum atrial septal defect. Doppler showed a small bidirectional, but predominantly left-to-right, atrial level shunt, in the baseline state. Tiny amount of right to left shunt is only seen very briefly at end-diastole.  ------------------------------------------------------------------- Right ventricle:  The cavity size was normal. Wall thickness was normal. Systolic function was normal.  ------------------------------------------------------------------- Pulmonic valve:    Poorly visualized.  Structurally normal valve. Cusp separation was normal.  No evidence of vegetation.  ------------------------------------------------------------------- Tricuspid valve:   Structurally normal valve.   Leaflet separation was normal.  No evidence of vegetation.  Doppler:  There was no regurgitation.  ------------------------------------------------------------------- Right atrium:  The atrium was normal in size.  No evidence of thrombus in the atrial cavity or appendage.  ------------------------------------------------------------------- Pericardium:  There was no pericardial effusion.  ------------------------------------------------------------------- Post procedure conclusions Ascending Aorta:  - The ascending aorta is replaced by a 26 mm Dacron graft. The graft is surrounded by a lerge mass of heterogenous tissue, consistent with thrombosed old aortic aneurysm lumen. Without older echo images, cannot entirely exclude a periannular abscess. The aortic arch and descending aorta are normal in caliber, without evidence of dissection or atherosclerotic disease.  ------------------------------------------------------------------- Prepared and Electronically Authenticated by  Sanda Klein, MD 2018-07-23T15:31:02             Echo [facility] 02/03/2023 (unable to review images) SUMMARY  LV ejection fraction = 45-50%.  There is mild global hypokinesis of the left ventricle.  Left ventricular filling pattern is indeterminate.  The right ventricle is mildly dilated.  The right ventricular systolic function is normal.  The right atrium is mildly dilated.  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  Dilated IVC with normal collapsibility consistent with a mildly elevated RA  pressure.  There is no pericardial effusion.   Patient Profile     62 y.o. female  with history of  ascending aortic  aneurysm repair with aortic valve resuspension 2017 followed by aortic valve bioprosthetic replacement 2019, previous stroke, hypertension, hyperlipidemia, nonobstructive CAD, PFO, heart failure with mildly reduced ejection fraction, recent severe GI bleed requiring transfusion ([place], endoscopy showed hiatal hernia and severe colonic diverticulosis), new development of moderate to severe MR, now admitted with acute hypoxic respiratory failure due to heart failure exacerbation in the setting of atrial flutter with rapid ventricular response   Assessment & Plan  SCAI C Cardiogenic shock - appreciate PPCM and AHF recs - continue IV lasix for now; she is unable to get ,more aggressive diuresis until she is able to swallow PO; may have to consider 20 meq [place] Line runs - on low dose pressors lactate resolved - heart rates are better pending TTE today - unable to resume GDMT in the setting of current hemodynamics  AFL  Severe MR - tetnatively planned for TEE/DCCV Thursday - IV amiodarone - clinically I suspect she will need mTEER for functional MR - on DOAC  HX of GI bleed IDA - s/p IV iron; presently back on DOAC with stable (~ hgb 8) anemia  Non obstructive CAD - needs statin when swallowing  Hyponatremia - improved with diuresis  Hx of ascending aortic repair for dissection and AVR - no evidence of failure TTE pending  Dysphagia and no BP - pending barium swallow today - would consider suppository if no BP after speech eval if she fails again today   Cardiology Primary NPO due to dysphagia Full Code  Will collaborate with AHF-may have the resume primary   CRITICAL CARE Performed by: Therasa Lorenzi A Gurley Climer  Total critical care time: 51 minutes. Critical care time was exclusive of separately billable procedures and treating other patients. Critical care was necessary to treat or prevent imminent or life-threatening deterioration. Critical care was time spent  personally by me on the following activities: development of treatment plan with patient and/or surrogate as well as nursing, discussions with consultants, evaluation of patient's response to treatment, examination of patient, obtaining history from patient or surrogate, ordering and performing treatments and interventions, ordering and review of laboratory studies, ordering and review of radiographic studies, pulse oximetry and re-evaluation of patient's condition.    Signed, Rudean Haskell, MD [place]  02/27/2023 8:54 AM    For questions or updates, please contact [facility] Please consult [email or web address] for contact info under    Rudean Haskell, MD [place], [address] [place], [place] [address] [phone]  8:03 AM

## 2023-02-27 NOTE — Progress Notes (Signed)
Modified Barium Swallow Study  Patient Details  Name: Keilee Lemmon MRN: [id number] Date of Birth: 1961-07-19  Today's Date: 02/27/2023  Modified Barium Swallow completed.  Full report located under Chart Review in the Imaging Section.  History of Present Illness Calie Siegel is a 62 y.o. 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  She was diagnosed with acute respiratory, acute on chronic systolic heart failure and afib with RVR.  Chest xray was showing moderate pulmonary edema with small right pleural effusion.   Clinical Impression Ms. Crotzer presented with a mild oropharyngeal dysphagia marked by intermittent, trace aspiration of thin liquids, usually occurring during sequential swallows.  Aspiration was accompanied by an initial cough.  A chin tuck did not appear to offer protection.  There was prolonged and disorganized oral preparation, adequate pharyngeal clearance, and incomplete closure of the laryngeal vestibule, allowing thin liquids to slip in and settle on the vocal folds and then transition below.  Recommend thickening liquids to nectar for now; continue regular consistency solids. SLP will follow for timing/closure strategies. Factors that may increase risk of adverse event in presence of aspiration Phineas Douglas & Padilla 2021):    Swallow Evaluation Recommendations Recommendations: PO diet PO Diet Recommendation: Regular;Mildly thick liquids (Level 2, nectar thick) Liquid Administration via: Cup;Straw Medication Administration: Whole meds with liquid Supervision: Patient able to self-feed Swallowing strategies  : Slow rate;Small bites/sips Oral care recommendations: Oral care BID (2x/day)    Adriena Manfre L. Tivis Ringer, MA  CCC/SLP Clinical Specialist - Acute Care SLP Acute Rehabilitation Services Office number [phone]   [name] 02/27/2023,1:21 PM

## 2023-02-27 NOTE — Progress Notes (Signed)
ANTICOAGULATION CONSULT NOTE - Follow Up Consult  Pharmacy Consult for heparin Indication: atrial fibrillation  Labs: Recent Labs    02/25/23 1259 02/26/23 0023 02/26/23 0023 02/26/23 1053 02/26/23 1140 02/27/23 0214  HGB  --  8.4*   < > 10.5*  10.5*  --  8.0*  HCT  --  27.8*  --  31.0*  31.0*  --  26.5*  PLT  --  245  --   --   --  224  APTT  --   --   --   --   --  >200*  LABPROT  --   --   --   --  25.6*  --   INR  --   --   --   --  2.4*  --   HEPARINUNFRC  --   --   --   --   --  >1.10*  CREATININE 1.30* 1.08*  --   --   --  1.18*   < > = values in this interval not displayed.    Assessment: 62yo female supratherapeutic on heparin with initial dosing while apixaban on hold; no infusion issues or signs of bleeding per RN.  Of note Hgb 8.0, previous labs that resulted Hgb ~10 were from [facility], which is less accurate; previous CBCs run by lab had Hgb in the 8 range.  Goal of Therapy:  aPTT 66-102 seconds   Plan:  Will hold heparin x47mn then decrease heparin infusion by 4 units/kg/hr to 800 units/hr and check PTT in 8 hours.    VWynona Neat PharmD, BCPS  02/27/2023,3:33 AM

## 2023-02-27 NOTE — Evaluation (Signed)
Physical Therapy Evaluation Patient Details Name: Stacy Jenkins MRN: [id number] DOB: Nov 20, 1961 Today's Date: 02/27/2023  History of Present Illness  Patient is a 62 y/o female who presents on 2/29 for elective TEE. Pt with SOB and tachycardia. TEE cancelled due to A-fib with RVR and hypoxia. Admitted with acute respiratory failure secondary to volume overload and heart failure exacerbation as well as cardiogenic shock. PMH includes aortic aneurysm repair, CVA, severe aortic regurgitation s/p bioprosthetic AVR in 2019, heart failure with reduced EF (LVEF 35%), A-fib, CHF, HTN, DM.  Clinical Impression  Patient presents with generalized weakness, dyspnea on exertion, decreased activity tolerance, impaired balance and impaired mobility s/p above. Pt lives at home with her spouse and reports being independent for ADLS at baseline as well as ambulation. Today, pt requires Min A for bed mobility, transfers and taking a few steps in room to get to chair. Noted to have 3/4 DOE with minimal activity, requiring 6L/min 02 [place] at this time. BP pre activity 89/68, BP post activity 95/59. Hoping pt's mobility will progress with increased activity. May trial RW next session. Will follow acutely to maximize independence and mobility prior to return home.     Recommendations for follow up therapy are one component of a multi-disciplinary discharge planning process, led by the attending physician.  Recommendations may be updated based on patient status, additional functional criteria and insurance authorization.  Follow Up Recommendations Home health PT      Assistance Recommended at Discharge Frequent or constant Supervision/Assistance  Patient can return home with the following  A little help with walking and/or transfers;A little help with bathing/dressing/bathroom;Help with stairs or ramp for entrance;Assist for transportation;Assistance with cooking/housework    Equipment Recommendations Rolling walker (2  wheels) (pending improvement)  Recommendations for Other Services       Functional Status Assessment Patient has had a recent decline in their functional status and demonstrates the ability to make significant improvements in function in a reasonable and predictable amount of time.     Precautions / Restrictions Precautions Precautions: Fall;Other (comment) Precaution Comments: watch 02/HR Restrictions Weight Bearing Restrictions: No      Mobility  Bed Mobility Overal bed mobility: Needs Assistance Bed Mobility: Supine to Sit     Supine to sit: Min assist, HOB elevated     General bed mobility comments: Assist with trunk to get to EOB, no dizziness.    Transfers Overall transfer level: Needs assistance Equipment used: 1 person hand held assist Transfers: Sit to/from Stand Sit to Stand: Min assist           General transfer comment: Min A to power to standing with cues for hand placement/technique.    Ambulation/Gait Ambulation/Gait assistance: Min assist, +2 physical assistance Gait Distance (Feet): 5 Feet Assistive device: 2 person hand held assist Gait Pattern/deviations: Step-to pattern, Wide base of support, Shuffle Gait velocity: decreased Gait velocity interpretation: <1.31 ft/sec, indicative of household ambulator   General Gait Details: ABle to take a few steps to chair with HHA x2, unsteady with weakness in BLEs. 3/4 DOE. VSS on 6L/min 02 [place].  Stairs            Wheelchair Mobility    Modified Rankin (Stroke Patients Only)       Balance Overall balance assessment: Needs assistance Sitting-balance support: Feet supported, No upper extremity supported Sitting balance-Leahy Scale: Fair     Standing balance support: During functional activity, Bilateral upper extremity supported Standing balance-Leahy Scale: Poor Standing balance comment: Requires external  support for static and dynamic tasks                              Pertinent Vitals/Pain Pain Assessment Pain Assessment: No/denies pain    Home Living Family/patient expects to be discharged to:: Private residence Living Arrangements: Spouse/significant other Available Help at Discharge: Family;Available PRN/intermittently Type of Home: House Home Access: Stairs to enter Entrance Stairs-Rails: Left Entrance Stairs-Number of Steps: 3   Home Layout: One level;Laundry or work area in [employer]: None      Prior Function Prior Level of Function : Independent/Modified Independent             Mobility Comments: Reports independence but limited household ambulator ADLs Comments: independent     Hand Dominance   Dominant Hand: Right    Extremity/Trunk Assessment   Upper Extremity Assessment Upper Extremity Assessment: Defer to OT evaluation    Lower Extremity Assessment Lower Extremity Assessment: Generalized weakness    Cervical / Trunk Assessment Cervical / Trunk Assessment: Normal  Communication   Communication: Expressive difficulties  Cognition Arousal/Alertness: Awake/alert Behavior During Therapy: WFL for tasks assessed/performed Overall Cognitive Status: No family/caregiver present to determine baseline cognitive functioning Area of Impairment: Problem solving                             Problem Solving: Slow processing, Requires verbal cues General Comments: Delayed processing and response time.        General Comments General comments (skin integrity, edema, etc.): VSS on 6L/min 02 [place]. BP pre activity 89/68, BP post activity 95/59    Exercises General Exercises - Lower Extremity Long Arc Quad: AROM, Both, 10 reps, Seated   Assessment/Plan    PT Assessment Patient needs continued PT services  PT Problem List Decreased strength;Decreased mobility;Decreased balance;Cardiopulmonary status limiting activity;Decreased activity tolerance;Decreased cognition       PT Treatment  Interventions Therapeutic activities;DME instruction;Gait training;Therapeutic exercise;Patient/family education;Balance training;Functional mobility training;Stair training    PT Goals (Current goals can be found in the Care Plan section)  Acute Rehab PT Goals Patient Stated Goal: to feel better, go home PT Goal Formulation: With patient Time For Goal Achievement: 03/13/23 Potential to Achieve Goals: Good    Frequency Min 3X/week     Co-evaluation               AM-PAC PT "6 Clicks" Mobility  Outcome Measure Help needed turning from your back to your side while in a flat bed without using bedrails?: A Little Help needed moving from lying on your back to sitting on the side of a flat bed without using bedrails?: A Little Help needed moving to and from a bed to a chair (including a wheelchair)?: A Little Help needed standing up from a chair using your arms (e.g., wheelchair or bedside chair)?: A Little Help needed to walk in hospital room?: A Little Help needed climbing 3-5 steps with a railing? : Total 6 Click Score: 16    End of Session   Activity Tolerance: Patient limited by fatigue;Other (comment) (SOB) Patient left: in chair;with call bell/phone within reach;with nursing/sitter in room;with chair alarm set Nurse Communication: Mobility status PT Visit Diagnosis: Difficulty in walking, not elsewhere classified (R26.2);Muscle weakness (generalized) (M62.81);Unsteadiness on feet (R26.81)    Time: [id number] PT Time Calculation (min) (ACUTE ONLY): 18 min   Charges:   PT Evaluation $PT Eval Moderate Complexity:  1 Mod          Zettie Cooley, DPT Acute Rehabilitation Services Secure chat preferred Office [place] 02/27/2023, 11:32 AM

## 2023-02-28 DIAGNOSIS — Z515 Encounter for palliative care: Secondary | ICD-10-CM

## 2023-02-28 DIAGNOSIS — Z7189 Other specified counseling: Secondary | ICD-10-CM | POA: Diagnosis not present

## 2023-02-28 DIAGNOSIS — I5023 Acute on chronic systolic (congestive) heart failure: Secondary | ICD-10-CM | POA: Diagnosis not present

## 2023-02-28 DIAGNOSIS — R570 Cardiogenic shock: Secondary | ICD-10-CM | POA: Diagnosis not present

## 2023-02-28 LAB — BASIC METABOLIC PANEL
Anion gap: 10 (ref 5–15)
Anion gap: 14 (ref 5–15)
Anion gap: 12 (ref 5–15)
BUN: 18 mg/dL (ref 8–23)
BUN: 24 mg/dL — ABNORMAL HIGH (ref 8–23)
BUN: 28 mg/dL — ABNORMAL HIGH (ref 8–23)
CO2: 26 mmol/L (ref 22–32)
CO2: 37 mmol/L — ABNORMAL HIGH (ref 22–32)
CO2: 33 mmol/L — ABNORMAL HIGH (ref 22–32)
Calcium: 5.6 mg/dL — CL (ref 8.9–10.3)
Calcium: 8.4 mg/dL — ABNORMAL LOW (ref 8.9–10.3)
Calcium: 8.7 mg/dL — ABNORMAL LOW (ref 8.9–10.3)
Chloride: 98 mmol/L (ref 98–111)
Chloride: 77 mmol/L — ABNORMAL LOW (ref 98–111)
Chloride: 82 mmol/L — ABNORMAL LOW (ref 98–111)
Creatinine, Ser: 0.68 mg/dL (ref 0.44–1.00)
Creatinine, Ser: 1.07 mg/dL — ABNORMAL HIGH (ref 0.44–1.00)
Creatinine, Ser: 1.18 mg/dL — ABNORMAL HIGH (ref 0.44–1.00)
GFR, Estimated: >60 mL/min (ref >60)
GFR, Estimated: 58 mL/min — ABNORMAL LOW (ref >60)
GFR, Estimated: 52 mL/min — ABNORMAL LOW (ref >60)
Glucose, Bld: 96 mg/dL (ref 70–99)
Glucose, Bld: 115 mg/dL — ABNORMAL HIGH (ref 70–99)
Glucose, Bld: 139 mg/dL — ABNORMAL HIGH (ref 70–99)
Potassium: <2 mmol/L — CL (ref 3.5–5.1)
Potassium: 2.4 mmol/L — CL (ref 3.5–5.1)
Potassium: 4.2 mmol/L (ref 3.5–5.1)
Sodium: 134 mmol/L — ABNORMAL LOW (ref 135–145)
Sodium: 128 mmol/L — ABNORMAL LOW (ref 135–145)
Sodium: 127 mmol/L — ABNORMAL LOW (ref 135–145)

## 2023-02-28 LAB — CBC
HCT: 27.1 % — ABNORMAL LOW (ref 36.0–46.0)
Hemoglobin: 8.3 g/dL — ABNORMAL LOW (ref 12.0–15.0)
MCH: 22.6 pg — ABNORMAL LOW (ref 26.0–34.0)
MCHC: 30.6 g/dL (ref 30.0–36.0)
MCV: 73.6 fL — ABNORMAL LOW (ref 80.0–100.0)
Platelets: 230 10*3/uL (ref 150–400)
RBC: 3.68 MIL/uL — ABNORMAL LOW (ref 3.87–5.11)
RDW: 21.7 % — ABNORMAL HIGH (ref 11.5–15.5)
WBC: 7.7 10*3/uL (ref 4.0–10.5)
nRBC: 1.4 % — ABNORMAL HIGH (ref 0.0–0.2)

## 2023-02-28 LAB — GLUCOSE, CAPILLARY
Glucose-Capillary: 118 mg/dL — ABNORMAL HIGH (ref 70–99)
Glucose-Capillary: 136 mg/dL — ABNORMAL HIGH (ref 70–99)
Glucose-Capillary: 117 mg/dL — ABNORMAL HIGH (ref 70–99)
Glucose-Capillary: 179 mg/dL — ABNORMAL HIGH (ref 70–99)
Glucose-Capillary: 175 mg/dL — ABNORMAL HIGH (ref 70–99)
Glucose-Capillary: 147 mg/dL — ABNORMAL HIGH (ref 70–99)

## 2023-02-28 LAB — MAGNESIUM
Magnesium: 1.3 mg/dL — ABNORMAL LOW (ref 1.7–2.4)
Magnesium: 1.9 mg/dL (ref 1.7–2.4)

## 2023-02-28 LAB — POCT I-STAT, CHEM 8
BUN: 23 mg/dL (ref 8–23)
Calcium, Ion: 1.05 mmol/L — ABNORMAL LOW (ref 1.15–1.40)
Chloride: 76 mmol/L — ABNORMAL LOW (ref 98–111)
Creatinine, Ser: 1.2 mg/dL — ABNORMAL HIGH (ref 0.44–1.00)
Glucose, Bld: 119 mg/dL — ABNORMAL HIGH (ref 70–99)
HCT: 29 % — ABNORMAL LOW (ref 36.0–46.0)
Hemoglobin: 9.9 g/dL — ABNORMAL LOW (ref 12.0–15.0)
Potassium: 2.5 mmol/L — CL (ref 3.5–5.1)
Sodium: 127 mmol/L — ABNORMAL LOW (ref 135–145)
TCO2: 39 mmol/L — ABNORMAL HIGH (ref 22–32)

## 2023-02-28 LAB — COOXEMETRY PANEL
Carboxyhemoglobin: 1.4 % (ref 0.5–1.5)
Methemoglobin: 0.7 % (ref 0.0–1.5)
O2 Saturation: 59.9 %
Total hemoglobin: 8.6 g/dL — ABNORMAL LOW (ref 12.0–16.0)

## 2023-02-28 LAB — HEPARIN LEVEL (UNFRACTIONATED): Heparin Unfractionated: >1.1 IU/mL — ABNORMAL HIGH (ref 0.30–0.70)

## 2023-02-28 LAB — APTT: aPTT: 60 seconds — ABNORMAL HIGH (ref 24–36)

## 2023-02-28 MED ORDER — POTASSIUM CHLORIDE 10 MEQ/50ML IV SOLN
10.0000 meq | INTRAVENOUS | Status: AC
  Administered 2023-02-28 (×8): 10 meq via INTRAVENOUS
  Filled 2023-02-28 (×8): qty 50

## 2023-02-28 MED ORDER — POTASSIUM CHLORIDE 20 MEQ PO PACK
80.0000 meq | PACK | Freq: Once | ORAL | Status: AC
  Administered 2023-02-28: 80 meq via ORAL
  Filled 2023-02-28: qty 4

## 2023-02-28 MED ORDER — MAGNESIUM SULFATE 4 GM/100ML IV SOLN
4.0000 g | Freq: Once | INTRAVENOUS | Status: AC
  Administered 2023-02-28: 4 g via INTRAVENOUS
  Filled 2023-02-28: qty 100

## 2023-02-28 MED ORDER — POLYETHYLENE GLYCOL 3350 17 G PO PACK
17.0000 g | PACK | Freq: Every day | ORAL | Status: DC
  Administered 2023-02-28 – 2023-03-08 (×8): 17 g via ORAL
  Filled 2023-02-28 (×10): qty 1

## 2023-02-28 MED ORDER — FUROSEMIDE 10 MG/ML IJ SOLN
80.0000 mg | Freq: Two times a day (BID) | INTRAMUSCULAR | Status: AC
  Administered 2023-02-28 – 2023-03-01 (×2): 80 mg via INTRAVENOUS
  Filled 2023-02-28 (×2): qty 8

## 2023-02-28 MED ORDER — SPIRONOLACTONE 25 MG PO TABS
50.0000 mg | ORAL_TABLET | Freq: Every day | ORAL | Status: DC
  Administered 2023-03-01 – 2023-03-08 (×8): 50 mg via ORAL
  Filled 2023-02-28 (×8): qty 2

## 2023-02-28 MED ORDER — APIXABAN 5 MG PO TABS
5.0000 mg | ORAL_TABLET | Freq: Two times a day (BID) | ORAL | Status: DC
  Administered 2023-02-28 – 2023-03-08 (×17): 5 mg via ORAL
  Filled 2023-02-28 (×17): qty 1

## 2023-02-28 MED ORDER — SORBITOL 70 % SOLN
30.0000 mL | Freq: Once | Status: AC
  Administered 2023-02-28: 30 mL via ORAL
  Filled 2023-02-28: qty 30

## 2023-02-28 MED ORDER — POTASSIUM CHLORIDE 20 MEQ PO PACK
20.0000 meq | PACK | Freq: Once | ORAL | Status: AC
  Administered 2023-02-28: 20 meq via ORAL
  Filled 2023-02-28: qty 1

## 2023-02-28 MED ORDER — CALCIUM GLUCONATE-NACL 1-0.675 GM/50ML-% IV SOLN
1.0000 g | Freq: Once | INTRAVENOUS | Status: AC
  Administered 2023-02-28: 1000 mg via INTRAVENOUS
  Filled 2023-02-28: qty 50

## 2023-02-28 NOTE — H&P (View-Only) (Signed)
Advanced Heart Failure Rounding Note  PCP-Cardiologist: [name], MD   Subjective:    3/4 S/P RHC RA 15, PA 60/31 (40)PCWP 30>50, Fick CO/CI 5.3 CI 2.8 . Started on Norepi 5 mcg and diuresed with IV lasix.  3/5 Diuresed with IV lasix + Diuril. Swallow study with recommendations for mildly thick liquids.  Back in SR.   Lactic acid 3.4>1.7>1.4   Currently on Norepi 5 mcg+ amio 30 mg  per hour.   Brisk diuresis -3.8  liters. K 2    "I want to die". Feels terrible.     Objective:   Weight Range: 71.5 kg Body mass index is 24.69 kg/m.   Vital Signs:   Temp:  [97.6 F (36.4 C)-99 F (37.2 C)] 98.4 F (36.9 C) (03/06 0400) Pulse Rate:  [31-94] 69 (03/06 0700) Resp:  [12-20] 15 (03/06 0700) BP: (75-108)/(51-77) 89/67 (03/06 0700) SpO2:  [90 %-100 %] 97 % (03/06 0700) FiO2 (%):  [30 %] 30 % (03/06 0310) Weight:  [71.5 kg] 71.5 kg (03/06 0416) Last BM Date : 02/23/23  Weight change: Filed Weights   02/26/23 0500 02/27/23 0400 02/28/23 0416  Weight: 76.4 kg 73.5 kg 71.5 kg    Intake/Output:   Intake/Output Summary (Last 24 hours) at 02/28/2023 0722 Last data filed at 02/28/2023 0700 Gross per 24 hour  Intake 1975.23 ml  Output 5870 ml  Net -3894.77 ml    CVP 13-14   Physical Exam    General:   No resp difficulty. In bed.  HEENT: normal Neck: supple. JVP 11-12  . Carotids 2+ bilat; no bruits. No lymphadenopathy or thryomegaly appreciated. Cor: PMI nondisplaced. Regular rate & rhythm. No rubs, gallops or murmurs. Zolll Pads in place.  Lungs: Decreased on 6 HFNC.  Abdomen: soft, nontender, nondistended. No hepatosplenomegaly. No bruits or masses. Good bowel sounds. Extremities: no cyanosis, clubbing, rash, edema. RUE PICC  Neuro: alert & orientedx3, cranial nerves grossly intact. moves all 4 extremities w/o difficulty. Affect flat.     Telemetry    SR 80s Multiple runs of NSVT.  EKG    N/A  Labs    CBC Recent Labs    02/27/23 1301  02/28/23 0245 02/28/23 0412  WBC 8.3 7.7  --   HGB 8.2* 8.3* 9.9*  HCT 28.0* 27.1* 29.0*  MCV 76.3* 73.6*  --   PLT 237 230  --    Basic Metabolic Panel Recent Labs    02/28/23 0224 02/28/23 0412 02/28/23 0423  NA 134* 127* 128*  K <2.0* 2.5* 2.4*  CL 98 76* 77*  CO2 26  --  37*  GLUCOSE 96 119* 115*  BUN 18 23 24*  CREATININE 0.68 1.20* 1.07*  CALCIUM 5.6*  --  8.4*  MG 1.3*  --  1.9   Liver Function Tests Recent Labs    02/26/23 1140  AST 223*  ALT 200*  ALKPHOS 193*  BILITOT 1.9*  PROT 6.3*  ALBUMIN 3.5   No results for input(s): "LIPASE", "AMYLASE" in the last 72 hours. Cardiac Enzymes No results for input(s): "CKTOTAL", "CKMB", "CKMBINDEX", "TROPONINI" in the last 72 hours.  BNP: BNP (last 3 results) Recent Labs    02/22/23 1534  BNP 876.3*    ProBNP (last 3 results) Recent Labs    02/21/23 1203  PROBNP 4,604*     D-Dimer No results for input(s): "DDIMER" in the last 72 hours. Hemoglobin A1C No results for input(s): "HGBA1C" in the last 72 hours. Fasting Lipid  Panel No results for input(s): "CHOL", "HDL", "LDLCALC", "TRIG", "CHOLHDL", "LDLDIRECT" in the last 72 hours. Thyroid Function Tests No results for input(s): "TSH", "T4TOTAL", "T3FREE", "THYROIDAB" in the last 72 hours.  Invalid input(s): "FREET3"  Other results:   Imaging    ECHOCARDIOGRAM COMPLETE  Result Date: 02/27/2023    ECHOCARDIOGRAM REPORT   Patient Name:   Stacy Jenkins Date of Exam: 02/27/2023 Medical Rec #:  [id number]       Height:       67.0 in Accession #:    [id number]      Weight:       162.0 lb Date of Birth:  12/09/1961       BSA:          1.849 m Patient Age:    62 years        BP:           94/60 mmHg Patient Gender: F               HR:           87 bpm. Exam Location:  Inpatient Procedure: 2D Echo, Cardiac Doppler, Color Doppler and 3D Echo Indications:    Mitral Valve insufficiency I34.0  History:        Patient has prior history of Echocardiogram examinations,  most                 recent 08/04/2022. CHF, Stroke, Aortic Valve Disease and Mitral                 Valve Disease; Risk Factors:Non-Smoker.                 Aortic Valve: unknown bioprosthetic valve is present in the                 aortic position.  Sonographer:    Wilkie Aye RVT RCS Referring Phys: [id number] RAVI Hines  1. Severe, torrential MR. Central jet. Appears functional. TEE recommended to clarify pathology. The mitral valve is abnormal. Severe mitral valve regurgitation. No evidence of mitral stenosis.  2. The tricuspid valve is abnormal. Tricuspid valve regurgitation is severe.  3. Left ventricular ejection fraction, by estimation, is 25 to 30%. The left ventricle has severely decreased function. The left ventricle demonstrates global hypokinesis. The left ventricular internal cavity size was severely dilated. Left ventricular diastolic function could not be evaluated.  4. Right ventricular systolic function is moderately reduced. The right ventricular size is moderately enlarged. There is mildly elevated pulmonary artery systolic pressure. The estimated right ventricular systolic pressure is 99991111 mmHg.  5. Left atrial size was severely dilated.  6. Right atrial size was severely dilated.  7. The aortic valve has been repaired/replaced. Aortic valve regurgitation is not visualized. There is a unknown bioprosthetic valve present in the aortic position. Echo findings are consistent with normal structure and function of the aortic valve prosthesis. Aortic valve area, by VTI measures 2.74 cm. Aortic valve mean gradient measures 10.7 mmHg. Aortic valve Vmax measures 2.20 m/s.  8. The inferior vena cava is dilated in size with <50% respiratory variability, suggesting right atrial pressure of 15 mmHg. Comparison(s): Changes from prior study are noted. The left ventricular function is worsened. Severe MR. Severe TR. LVEF now severely reduced. FINDINGS  Left Ventricle: Left ventricular ejection  fraction, by estimation, is 25 to 30%. The left ventricle has severely decreased function. The left ventricle demonstrates global hypokinesis. The left ventricular internal cavity size was severely  dilated. There is no left ventricular hypertrophy. Left ventricular diastolic function could not be evaluated due to mitral regurgitation (moderate or greater). Left ventricular diastolic function could not be evaluated. Right Ventricle: The right ventricular size is moderately enlarged. No increase in right ventricular wall thickness. Right ventricular systolic function is moderately reduced. There is mildly elevated pulmonary artery systolic pressure. The tricuspid regurgitant velocity is 2.65 m/s, and with an assumed right atrial pressure of 15 mmHg, the estimated right ventricular systolic pressure is 99991111 mmHg. Left Atrium: Left atrial size was severely dilated. Right Atrium: Right atrial size was severely dilated. Pericardium: There is no evidence of pericardial effusion. Mitral Valve: Severe, torrential MR. Central jet. Appears functional. TEE recommended to clarify pathology. The mitral valve is abnormal. Severe mitral valve regurgitation. No evidence of mitral valve stenosis. Tricuspid Valve: The tricuspid valve is abnormal. Tricuspid valve regurgitation is severe. No evidence of tricuspid stenosis. The flow in the hepatic veins is reversed during ventricular systole. Aortic Valve: The aortic valve has been repaired/replaced. Aortic valve regurgitation is not visualized. Aortic valve mean gradient measures 10.7 mmHg. Aortic valve peak gradient measures 19.4 mmHg. Aortic valve area, by VTI measures 2.74 cm. There is a  unknown bioprosthetic valve present in the aortic position. Echo findings are consistent with normal structure and function of the aortic valve prosthesis. Pulmonic Valve: The pulmonic valve was grossly normal. Pulmonic valve regurgitation is trivial. No evidence of pulmonic stenosis. Aorta: The  aortic root and ascending aorta are structurally normal, with no evidence of dilitation. Venous: The inferior vena cava is dilated in size with less than 50% respiratory variability, suggesting right atrial pressure of 15 mmHg. IAS/Shunts: The atrial septum is grossly normal.  LEFT VENTRICLE PLAX 2D LVIDd:         7.30 cm   Diastology LVIDs:         5.20 cm   LV e' medial:    5.63 cm/s LV PW:         1.00 cm   LV E/e' medial:  14.0 LV IVS:        0.80 cm   LV e' lateral:   14.10 cm/s LVOT diam:     2.17 cm   LV E/e' lateral: 5.6 LV SV:         89 LV SV Index:   48 LVOT Area:     3.70 cm                           3D Volume EF:                          3D EF:        66 %                          LV EDV:       261 ml                          LV ESV:       89 ml                          LV SV:        173 ml RIGHT VENTRICLE             IVC RV Basal diam:  4.80 cm     IVC diam: 3.80 cm RV Mid diam:    2.90 cm RV S prime:     11.90 cm/s TAPSE (M-mode): 1.6 cm LEFT ATRIUM              Index        RIGHT ATRIUM           Index LA diam:        5.60 cm  3.03 cm/m   RA Area:     19.30 cm LA Vol (A2C):   114.0 ml 61.65 ml/m  RA Volume:   49.80 ml  26.93 ml/m LA Vol (A4C):   117.0 ml 63.27 ml/m LA Biplane Vol: 123.0 ml 66.52 ml/m  AORTIC VALVE                     PULMONIC VALVE AV Area (Vmax):    2.81 cm      PV Vmax:       0.70 m/s AV Area (Vmean):   2.73 cm      PV Peak grad:  2.0 mmHg AV Area (VTI):     2.74 cm AV Vmax:           220.00 cm/s AV Vmean:          147.333 cm/s AV VTI:            0.326 m AV Peak Grad:      19.4 mmHg AV Mean Grad:      10.7 mmHg LVOT Vmax:         167.00 cm/s LVOT Vmean:        108.633 cm/s LVOT VTI:          0.242 m LVOT/AV VTI ratio: 0.74  AORTA Ao Root diam: 2.30 cm Ao Asc diam:  3.20 cm Ao Arch diam: 2.7 cm MITRAL VALVE               TRICUSPID VALVE MV Area (PHT): 4.08 cm    TR Peak grad:   28.1 mmHg MV Decel Time: 186 msec    TR Vmax:        265.00 cm/s MR Peak grad: 62.7 mmHg MR  Mean grad: 38.0 mmHg    SHUNTS MR Vmax:      396.00 cm/s  Systemic VTI:  0.24 m MR Vmean:     282.0 cm/s   Systemic Diam: 2.17 cm MV E velocity: 78.60 cm/s MV A velocity: 39.30 cm/s MV E/A ratio:  2.00 Eleonore Chiquito MD Electronically signed by Eleonore Chiquito MD Signature Date/Time: 02/27/2023/7:16:37 PM    Final    DG Swallowing Func-Speech Pathology  Result Date: 02/27/2023 Table formatting from the original result was not included. Objective Swallowing Evaluation: Type of Study: Bedside Swallow Evaluation  Patient Details Name: Stacy Jenkins MRN: [id number] Date of Birth: [birth year] Today's Date: 02/27/2023 Time: SLP Start Time (ACUTE ONLY): 1210 -SLP Stop Time (ACUTE ONLY): [id number] SLP Time Calculation (min) (ACUTE ONLY): 24 min Past Medical History: Past Medical History: Diagnosis Date  Abnormal echocardiogram 08/20/2018  Added automatically from request for surgery [id number]  Acute blood loss anemia 11/19/2018  Acute hyperglycemia 03/10/2016  Acute post-operative pain 11/19/2018  Aortic valve regurgitation 08/20/2018  Formatting of this note might be different from the original. Added automatically from request for surgery [id number]  Aphasia   Ascending aortic aneurysm ([place])   ASD (atrial septal defect) 02/03/2023  Benign essential hypertension 08/09/2017  at goal on current meds  Formatting  of this note might be different from the original. at goal on current meds  Cardiomyopathy ([place]) 01/29/2018  Dilated aortic root ([place]) 09/11/2012  Formatting of this note might be different from the original. 4.6 cm  Elevated blood-pressure reading without diagnosis of hypertension 01/29/2018  will monitor  Formatting of this note might be different from the original. will monitor  Expressive aphasia 11/20/2018  Hiatal hernia 01/29/2018  History of atrial fibrillation 11/20/2018  History of CVA (cerebrovascular accident) 08/09/2017  History of migraine 01/29/2018  Hyperlipidemia 01/08/2019  Hypertension 01/08/2019  Hypoglycemia  01/29/2018  dietary info  Formatting of this note might be different from the original. dietary info  Medicare annual wellness visit, subsequent 02/17/2019  Mixed dyslipidemia 08/09/2017  Nonrheumatic aortic valve insufficiency   Other abnormal glucose 01/29/2018  PFO (patent foramen ovale) 09/11/2012  Postoperative atrial fibrillation ([place]) 11/20/2018  S/P ascending aortic aneurysm repair 11/20/2018  S/P AVR 11/19/2018  Stroke (cerebrum) ([place]) - Left MCA branchStroke in a patient with history of prior strokes and mild residual deficits, s/p tPA 07/13/2017  Stroke ([place])   Vitamin D deficiency 01/29/2018 Past Surgical History: Past Surgical History: Procedure Laterality Date  aortic valve surgery  02/2016  RIGHT HEART CATH N/A 02/26/2023  Procedure: RIGHT HEART CATH;  Surgeon: Hebert Soho, DO;  Location: [facility];  Service: Cardiovascular;  Laterality: N/A;  TEE WITHOUT CARDIOVERSION N/A 07/16/2017  Procedure: TRANSESOPHAGEAL ECHOCARDIOGRAM (TEE);  Surgeon: Sanda Klein, MD;  Location: [facility];  Service: Cardiovascular;  Laterality: N/A; HPI: Maris Medearis is a 62 y.o. 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  She was diagnosed with acute respiratory, acute on chronic systolic heart failure and afib with RVR.  Chest xray was showing moderate pulmonary edema with small right pleural effusion.  Subjective: Patient was seen sitting in her bedside chair.  Recommendations for follow up therapy are one component of a multi-disciplinary discharge planning process, led by the attending physician.  Recommendations may be updated based on patient status, additional functional criteria and insurance authorization. Assessment / Plan / Recommendation History  of Present Illness Boston Schied is a 62 y.o. 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  She was diagnosed with acute respiratory, acute on chronic systolic heart failure and afib with RVR.  Chest xray was showing moderate pulmonary edema with small right pleural effusion.   Clinical Impression Ms. Kovacevich presented with a mild oropharyngeal dysphagia marked by intermittent, trace aspiration of thin liquids, usually occurring during sequential swallows.  Aspiration was accompanied by an initial cough.  A chin tuck did not appear to offer protection.  There was prolonged and disorganized oral preparation, adequate pharyngeal clearance, and incomplete closure of the laryngeal vestibule, allowing thin liquids to slip in and settle on the vocal folds and then transition below.  Recommend thickening liquids to nectar for now; continue regular consistency solids. SLP will follow for timing/closure strategies. Factors that may increase risk of adverse event in presence of aspiration Phineas Douglas & Padilla 2021):   Swallow Evaluation Recommendations Recommendations: PO diet PO Diet Recommendation: Regular;Mildly thick liquids (Level 2, nectar thick) Liquid Administration via: Cup;Straw Medication  Administration: Whole meds with liquid Supervision: Patient able to self-feed Swallowing strategies  : Slow rate;Small bites/sips Oral care recommendations: Oral care BID (2x/day)  Amanda L. Couture, MA CCC/SLP Clinical Specialist - Acute Care SLP Acute Rehabilitation Services Office number [phone]                    Medications:     Scheduled Medications:  Chlorhexidine Gluconate Cloth  6 each Topical Daily   furosemide  80 mg Intravenous BID   insulin aspart  0-15 Units  Subcutaneous TID WC   insulin aspart  0-5 Units Subcutaneous QHS   mouth rinse  15 mL Mouth Rinse 4 times per day   potassium chloride  40 mEq Oral BID   rosuvastatin  5 mg Oral Daily   sodium chloride flush  10-40 mL Intracatheter Q12H   sodium chloride flush  3 mL Intravenous Q12H   sodium chloride flush  3 mL Intravenous Q12H   sodium chloride flush  3 mL Intravenous Q12H   spironolactone  25 mg Oral Daily    Infusions:  sodium chloride 250 mL (02/28/23 0500)   sodium chloride Stopped (02/28/23 0603)   sodium chloride     amiodarone 30 mg/hr (02/28/23 0700)   heparin 800 Units/hr (02/28/23 0700)   norepinephrine (LEVOPHED) Adult infusion 5 mcg/min (02/28/23 0700)   potassium chloride 10 mEq (02/28/23 0709)    PRN Medications: sodium chloride, sodium chloride, acetaminophen, LORazepam, ondansetron (ZOFRAN) IV, mouth rinse, sodium chloride flush, sodium chloride flush, sodium chloride flush, traZODone    Patient Profile   Mrs Brendel is a 62 year old with a history of CVA, hypertension, hyperlipidemia, minimal CAD, patent foramen ovale, chronic systolic heart failure, 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    Admitted 02/22/2023 for the evaluation of dyspnea.   Assessment/Plan     1.  Shock --> Suspect Cardiogenic -Most recent ECHO EF 45-50% at [facility]. Will need to repeat. Concerned EF will be worse due to ongoing A fib RVR.  Persistent hypotension with elevated lactic acid. CO2 27 SCAI C. Advanced Heart Failure Team Consulted  Lactic acid clearing 3.4>1.4  RHC elevated filling pressures CO 5.3 CI 2.8 t - Remains on Norepi 5 mcg. CO-OX 60%  - CVP 13-14. K 2. Hold diuretics and allow K to come up.    2. A fib RVR  - Back in SR.  - Continue amio 60 mg per hour. .  - Continue heparin drip -Supp K    3. Acute Hypoxic Respiratory Failure ,  OSA- on Bipap  - Sats stable HFNC 6 liters.   -  CCM following.    4. MR  Will need TEE to further assess once diuresed. Will need GOC first.    5.  Anemia  -Recent GI bleed 01/2023  -2024 EGD/Colonoscopy with diverticulosis.  -Hgb trending down 10.5>8>8.3 -Check FOBT.  - Follow daily CBCs.    6. CAD 2019 Non obstructive CAD-->10% left main, <25% mid LAD, <25% RCA lesion.  - No chest pain.    7. H/O Bioprosthetic AVR 2019    8. DMII  -Hgb A1C 8 -Continue SSI    9. H/O CVA   10. Hypokalemia K 2. Supp K. Check BMET after K runs.   11. NSVT Multiple runs of NSVT over night.  - Increase amio 60 mg. Aggressive replace K.  - Mag 1.9. Give 2 grams Mag.   12. GOC She  told staff and me that she wants to die.  Consult Palliative Care for [place]. Need clear understanding of what her goals are before pursuing further procedures. Will need to involve her son/husband.    Will ask psychiatry to see    Length of Stay: [place], NP  02/28/2023, 7:22 AM  Advanced Heart Failure Team Pager [phone] (M-F; [place])  Please contact [facility] for night-coverage after hours (5p -7a ) and weekends on [email or web address]

## 2023-02-28 NOTE — Consult Note (Signed)
  62 y.o. 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  Patient with shock, cardiogenic suspected.  Remains on norepinephrine drip.  Diuretics held due to severe hypokalemia.  Also struggling with some dysphagia.  Requiring high flow nasal cannula, BiPAP at night.  Per chart review patient expressing to different staff members she does not want to continue with aggressive medical care.   Psych consult placed for depression.  Patient is seen and attempted to assess. She is observed to be sitting upright in a bedside chair with eyes closed, with a relative at her bedside. Psych consult will be deferred to tomorrow as patient recently completed therapies and was resting. Patient does acknowledge being open and willing to speak with someone from psychiatry, just not at this time. She also acknowledges making parasuicidal statements and continues to feel that way at this time.   Psych consult service will attempt to reassess tomorrow. Will attempt to round prior to therapies, as patient appears physically exhausted.  Patient may benefit from chaplain consult.

## 2023-02-28 NOTE — Evaluation (Signed)
Occupational Therapy Evaluation Patient Details Name: Stacy Jenkins MRN: [id number] DOB: 06-16-1961 Today's Date: 02/28/2023   History of Present Illness Patient is a 62 y/o female who presents on 2/29 for elective TEE. Pt with SOB and tachycardia. TEE cancelled due to A-fib with RVR and hypoxia. Admitted with acute respiratory failure secondary to volume overload and heart failure exacerbation as well as cardiogenic shock. PMH includes aortic aneurysm repair, CVA, severe aortic regurgitation s/p bioprosthetic AVR in 2019, heart failure with reduced EF (LVEF 35%), A-fib, CHF, HTN, DM.   Clinical Impression   Stacy Jenkins was evaluated s/p the above admission list. She is generally indep at baseline. Upon evaluation she was limited by increased O2 needs, impaired cognition, generalized weakness, decreased activity tolerance and unsteady balance. Overall she required min G for transfers and mobility with RW, and tolerated ~132f. Due to the deficits listed below, she also requires up to mod A for LB ADLs and min A for UB ADLs. Cues for PLB throughout, SpO2 >90% on 6L. Pt will benefit from continued acute OT services. [facility]       Recommendations for follow up therapy are one component of a multi-disciplinary discharge planning process, led by the attending physician.  Recommendations may be updated based on patient status, additional functional criteria and insurance authorization.   Follow Up Recommendations  Home health OT     Assistance Recommended at Discharge Frequent or constant Supervision/Assistance  Patient can return home with the following A little help with walking and/or transfers;A little help with bathing/dressing/bathroom;Assistance with cooking/housework;Assist for transportation;Help with stairs or ramp for entrance    Functional Status Assessment  Patient has had a recent decline in their functional status and demonstrates the  ability to make significant improvements in function in a reasonable and predictable amount of time.  Equipment Recommendations  Other (comment);BSC/3in1 (RW)       Precautions / Restrictions Precautions Precautions: Fall;Other (comment) Precaution Comments: watch O2/HR Restrictions Weight Bearing Restrictions: No      Mobility Bed Mobility               General bed mobility comments: OOB in the chair upon arrival    Transfers Overall transfer level: Needs assistance Equipment used: Rolling walker (2 wheels) Transfers: Sit to/from Stand Sit to Stand: Min assist                  Balance Overall balance assessment: Needs assistance Sitting-balance support: Feet supported, No upper extremity supported Sitting balance-Leahy Scale: Fair     Standing balance support: During functional activity, Bilateral upper extremity supported Standing balance-Leahy Scale: Poor                             ADL either performed or assessed with clinical judgement   ADL Overall ADL's : Needs assistance/impaired Eating/Feeding: Set up;Sitting   Grooming: Minimal assistance;Sitting   Upper Body Bathing: Minimal assistance;Sitting   Lower Body Bathing: Moderate assistance;Sit to/from stand   Upper Body Dressing : Minimal assistance;Sitting   Lower Body Dressing: Maximal assistance;Sit to/from stand   Toilet Transfer: Minimal assistance;Ambulation;Rolling walker (2 wheels)   Toileting- Clothing Manipulation and Hygiene: Min guard;Sitting/lateral lean       Functional mobility during ADLs: Minimal assistance;Rolling walker (2 wheels) General ADL Comments: slow and deliberate, cues and significantly increased time required for all tasks     Vision Baseline Vision/History: 1 Wears glasses  Vision Assessment?: No apparent visual deficits     Perception Perception Perception Tested?: No   Praxis Praxis Praxis tested?: Not tested    Pertinent Vitals/Pain  Pain Assessment Pain Assessment: No/denies pain     Hand Dominance Right   Extremity/Trunk Assessment Upper Extremity Assessment Upper Extremity Assessment: Generalized weakness   Lower Extremity Assessment Lower Extremity Assessment: Overall WFL for tasks assessed;Defer to PT evaluation   Cervical / Trunk Assessment Cervical / Trunk Assessment: Normal   Communication Communication Communication: No difficulties   Cognition Arousal/Alertness: Awake/alert Behavior During Therapy: Flat affect Overall Cognitive Status: Impaired/Different from baseline Area of Impairment: Memory, Following commands, Safety/judgement, Awareness, Problem solving                     Memory: Decreased short-term memory Following Commands: Follows one step commands with increased time Safety/Judgement: Decreased awareness of safety, Decreased awareness of deficits Awareness: Emergent Problem Solving: Slow processing, Decreased initiation, Requires verbal cues General Comments: requires increased time, limited insight and problem solving.     General Comments  VSS on 6L [place]     Home Living Family/patient expects to be discharged to:: Private residence Living Arrangements: Spouse/significant other Available Help at Discharge: Family;Available PRN/intermittently Type of Home: House Home Access: Stairs to enter [employer] of Steps: 3 Entrance Stairs-Rails: Left Home Layout: One level;Laundry or work area in basement     [employer]: Occupational psychologist: Radio producer: None          Prior Functioning/Environment Prior Level of Function : Independent/Modified Independent             Mobility Comments: Reports independence but limited household ambulator ADLs Comments: indep, husband does IALDs        OT Problem List: Decreased strength;Decreased range of motion;Decreased activity tolerance;Impaired balance (sitting and/or  standing);Decreased safety awareness;Decreased knowledge of use of DME or AE;Decreased knowledge of precautions;Pain      OT Treatment/Interventions: Therapeutic exercise;Self-care/ADL training;DME and/or AE instruction;Energy conservation;Therapeutic activities;Patient/family education;Balance training    OT Goals(Current goals can be found in the care plan section) Acute Rehab OT Goals Patient Stated Goal: to get up and move OT Goal Formulation: With patient Time For Goal Achievement: 03/14/23 Potential to Achieve Goals: Good ADL Goals Pt Will Perform Grooming: with supervision;standing Pt Will Perform Lower Body Dressing: with supervision;sit to/from stand Pt Will Transfer to Toilet: with supervision;ambulating Additional ADL Goal #1: Pt will tolerate at least 8x minutes of OOB acitivty to demonstrate increased activity tolerance Additional ADL Goal #2: Pt will indep recall at least 3 energy conservation techniques  OT Frequency: Min 2X/week       AM-PAC OT "6 Clicks" Daily Activity     Outcome Measure Help from another person eating meals?: None Help from another person taking care of personal grooming?: A Little Help from another person toileting, which includes using toliet, bedpan, or urinal?: A Little Help from another person bathing (including washing, rinsing, drying)?: A Lot Help from another person to put on and taking off regular upper body clothing?: A Little Help from another person to put on and taking off regular lower body clothing?: A Lot 6 Click Score: 17   End of Session Equipment Utilized During Treatment: Gait belt;Rolling walker (2 wheels);Oxygen Nurse Communication: Mobility status  Activity Tolerance: Patient tolerated treatment well Patient left: in chair;with call bell/phone within reach;with chair alarm set  OT Visit Diagnosis: Unsteadiness on feet (R26.81);Other abnormalities of  gait and mobility (R26.89);Muscle weakness (generalized) (M62.81);Pain                 Time: [id number] OT Time Calculation (min): 36 min Charges:  OT General Charges $OT Visit: 1 Visit OT Evaluation $OT Eval Moderate Complexity: 1 Mod OT Treatments $Therapeutic Activity: 8-22 mins  Shade Flood, OTR/L Acute Rehabilitation Services Office [phone] Secure Chat Communication Preferred   Elliot Cousin 02/28/2023, 1:22 PM

## 2023-02-28 NOTE — Consult Note (Signed)
Consultation Note Date: 02/28/2023   Patient Name: Stacy Jenkins  DOB: August 21, 1961  MRN: [id number]  Age / Sex: 62 y.o., female  PCP: [name], MD Referring Physician: Werner Lean,*  Reason for Consultation: Establishing goals of care  HPI/Patient Profile: 62 y.o. 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  Patient with shock, cardiogenic suspected.  Remains on norepinephrine drip.  Diuretics held due to severe hypokalemia.  Also struggling with some dysphagia.  Requiring high flow nasal cannula, BiPAP at night.  Per chart review patient expressing to different staff members she does not want to continue with aggressive medical care.  PMT consulted to discuss goals of care with patient.  Clinical Assessment and Goals of Care: I have reviewed medical records including EPIC notes, labs and imaging, received report from RN, assessed the patient and then met with patient and her spouse Stacy Jenkins to discuss diagnosis prognosis, Stacy Jenkins, EOL wishes, disposition and options.  I introduced Palliative Medicine as specialized medical care for people living with serious illness. It focuses on providing relief from the symptoms and stress of a serious illness. The goal is to improve quality of life for both the patient and the family.  We discussed a brief life review of the patient.  Stacy Jenkins shares he and Stacy Jenkins to have been married for 40 years.  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  He tells me about their son who is the Stacy Jenkins.  They have 1 grandchild.  As far as functional and nutritional status Stacy Jenkins shares Stacy Jenkins has still been able to complete ADLs independently at home.   He does share she has been getting weaker.  He tells me she has a good appetite at baseline.  He tells me of some cognitive impairment since her CVAs, specific difficulty with word finding.   We discussed patient's current illness and what it means in the larger context of patient's on-going co-morbidities.  Natural disease trajectory and expectations at EOL were discussed.  We discussed Stacy Jenkins's expression slightly indicating she may not want to continue with aggressive medical care.  Stacy Jenkins shares statements like this are quite common for Stacy Jenkins and she does this at home as well.  He tells me this is her baseline and he is not sure how much credit to get these as they developed after her stroke.  He tells me he feels she has great quality of life referencing that she recently attended her grandchild's first birthday party and had a great time.  Stacy Jenkins is able to agree to this during conversation.  I attempted to elicit values and goals of care important to the patient.    The difference between aggressive medical intervention and comfort care was considered in light of the patient's goals of care.  Stacy Jenkins expresses he would want patient to continue with aggressive medical care.  At this point in conversation Stacy Jenkins gets his son on speaker phone to join.  We reviewed the above and son also agrees to continue aggressive medical care.  We also addressed CODE STATUS.  Lengthy discussion but ultimately decided to remain full code/full scope.  Stacy Jenkins is clear that he would not want patient on ventilator long-term, just a few days to allow time for outcomes.  Patient agrees with this as well.  Discussed  with family the importance of continued conversation with family and the medical providers regarding overall plan of care and treatment options, ensuring decisions are within the context of the patients values and GOCs.    Questions and concerns were addressed. The family was encouraged to call with questions  or concerns.  Primary Decision Maker NEXT OF KIN Patient oriented but unclear to me if she is able to independently make decisions, husband served as Marine scientist during our conversation and patient agreed with husband's decisions  SUMMARY OF RECOMMENDATIONS   -To remain full code/full scope - would only want short -term intubation -Pending psych eval -Family expresses that patient often makes statements such as "I want to die" even at baseline when she is doing well and they do not think this accurate review reflects the sort of medical care she wants -during conversation patient does agree to continue with full scope/full code medical care  Code Status/Advance Care Planning: Full code     Primary Diagnoses: Present on Admission:  Acute on chronic systolic heart failure ([place])   I have reviewed the medical record, interviewed the patient and family, and examined the patient. The following aspects are pertinent.  Past Medical History:  Diagnosis Date   Abnormal echocardiogram 08/20/2018   Added automatically from request for surgery [id number]   Acute blood loss anemia 11/19/2018   Acute hyperglycemia 03/10/2016   Acute post-operative pain 11/19/2018   Aortic valve regurgitation 08/20/2018   Formatting of this note might be different from the original. Added automatically from request for surgery [id number]   Aphasia    Ascending aortic aneurysm ([place])    ASD (atrial septal defect) 02/03/2023   Benign essential hypertension 08/09/2017   at goal on current meds  Formatting of this note might be different from the original. at goal on current meds   Cardiomyopathy ([place]) 01/29/2018   Dilated aortic root ([place]) 09/11/2012   Formatting of this note might be different from the original. 4.6 cm   Elevated blood-pressure reading without diagnosis of hypertension 01/29/2018   will monitor  Formatting of this note might be different from the original. will monitor   Expressive aphasia  11/20/2018   Hiatal hernia 01/29/2018   History of atrial fibrillation 11/20/2018   History of CVA (cerebrovascular accident) 08/09/2017   History of migraine 01/29/2018   Hyperlipidemia 01/08/2019   Hypertension 01/08/2019   Hypoglycemia 01/29/2018   dietary info  Formatting of this note might be different from the original. dietary info   Medicare annual wellness visit, subsequent 02/17/2019   Mixed dyslipidemia 08/09/2017   Nonrheumatic aortic valve insufficiency    Other abnormal glucose 01/29/2018   PFO (patent foramen ovale) 09/11/2012   Postoperative atrial fibrillation ([place]) 11/20/2018   S/P ascending aortic aneurysm repair 11/20/2018   S/P AVR 11/19/2018   Stroke (cerebrum) ([place]) - Left MCA branchStroke in a patient with history of prior strokes and mild residual deficits, s/p tPA 07/13/2017   Stroke [facility])    Vitamin D deficiency 01/29/2018   Social History   Socioeconomic History   Marital status: Married    Spouse name: Not on file   Number of children: Not on file   Years of education: Not on file   Highest education level: Not on file  Occupational History   Not on file  Tobacco Use   Smoking status: Never   Smokeless tobacco: Never  Vaping Use   Vaping Use: Never used  Substance and Sexual Activity   Alcohol use:  No   Drug use: No   Sexual activity: Not on file  Other Topics Concern   Not on file  Social History Narrative   Not on file   Social Determinants of Health   Financial Resource Strain: Not on file  Food Insecurity: No Food Insecurity (10/05/2022)   Hunger Vital Sign    Worried About Running Out of Food in the Last Year: Never true    Ran Out of Food in the Last Year: Never true  Transportation Needs: No Transportation Needs (10/05/2022)   PRAPARE - Hydrologist (Medical): No    Lack of Transportation (Non-Medical): No  Physical Activity: Not on file  Stress: Not on file  Social Connections: Not on file    Family History  Problem Relation Age of Onset   Hypertension Mother    Heart disease Father    Heart attack Father    Heart disease Brother    Scheduled Meds:  Chlorhexidine Gluconate Cloth  6 each Topical Daily   insulin aspart  0-15 Units Subcutaneous TID WC   insulin aspart  0-5 Units Subcutaneous QHS   mouth rinse  15 mL Mouth Rinse 4 times per day   polyethylene glycol  17 g Oral Daily   potassium chloride  40 mEq Oral BID   rosuvastatin  5 mg Oral Daily   sodium chloride flush  10-40 mL Intracatheter Q12H   sodium chloride flush  3 mL Intravenous Q12H   sodium chloride flush  3 mL Intravenous Q12H   sodium chloride flush  3 mL Intravenous Q12H   sorbitol  30 mL Oral Once   [START ON 03/01/2023] spironolactone  50 mg Oral Daily   Continuous Infusions:  sodium chloride 250 mL (02/28/23 0500)   sodium chloride Stopped (02/28/23 0709)   sodium chloride     amiodarone 60 mg/hr (02/28/23 1015)   heparin 800 Units/hr (02/28/23 0800)   norepinephrine (LEVOPHED) Adult infusion 5 mcg/min (02/28/23 0805)   potassium chloride 10 mEq (02/28/23 0934)   PRN Meds:.sodium chloride, sodium chloride, acetaminophen, LORazepam, ondansetron (ZOFRAN) IV, mouth rinse, sodium chloride flush, sodium chloride flush, sodium chloride flush, traZODone Allergies  Allergen Reactions   Penicillin G Rash    Pt states she is not allergic   Review of Systems  Constitutional:  Positive for fatigue.    Physical Exam Constitutional:      General: She is not in acute distress.    Appearance: She is ill-appearing.  Pulmonary:     Effort: Pulmonary effort is normal.  Skin:    General: Skin is warm and dry.  Neurological:     Mental Status: She is alert and oriented to person, place, and time.  Psychiatric:        Attention and Perception: She is inattentive.        Speech: Speech is delayed.        Behavior: Behavior is cooperative.     Vital Signs: BP 93/67 (BP Location: Left Arm)   Pulse  72   Temp 98.4 F (36.9 C) (Axillary)   Resp 15   Ht '5\' 7"'$  (1.702 m)   Wt 71.5 kg   SpO2 100%   BMI 24.69 kg/m  Pain Scale: 0-10 POSS *See Group Information*: 1-Acceptable,Awake and alert Pain Score: 0-No pain   SpO2: SpO2: 100 % O2 Device:SpO2: 100 % O2 Flow Rate: .O2 Flow Rate (L/min): 6 L/min  IO: Intake/output summary:  Intake/Output Summary (Last 24 hours) at  02/28/2023 1100 Last data filed at 02/28/2023 0800 Gross per 24 hour  Intake 1844.16 ml  Output 4620 ml  Net -2775.84 ml    LBM: Last BM Date : 02/23/23 Baseline Weight: Weight: 79.3 kg Most recent weight: Weight: 71.5 kg     Palliative Assessment/Data:PPS 50%     *Please note that this is a verbal dictation therefore any spelling or grammatical errors are due to the "[place] [employer]" system interpretation.  Juel Burrow, DNP, AGNP-C Palliative Medicine Team [phone] Pager: [phone]

## 2023-02-28 NOTE — Progress Notes (Addendum)
Advanced Heart Failure Rounding Note  PCP-Cardiologist: [name], MD   Subjective:    3/4 S/P RHC RA 15, PA 60/31 (40)PCWP 30>50, Fick CO/CI 5.3 CI 2.8 . Started on Norepi 5 mcg and diuresed with IV lasix.  3/5 Diuresed with IV lasix + Diuril. Swallow study with recommendations for mildly thick liquids.  Back in SR.   Lactic acid 3.4>1.7>1.4   Currently on Norepi 5 mcg+ amio 30 mg  per hour.   Brisk diuresis -3.8  liters. K 2    "I want to die". Feels terrible.     Objective:   Weight Range: 71.5 kg Body mass index is 24.69 kg/m.   Vital Signs:   Temp:  [97.6 F (36.4 C)-99 F (37.2 C)] 98.4 F (36.9 C) (03/06 0400) Pulse Rate:  [31-94] 69 (03/06 0700) Resp:  [12-20] 15 (03/06 0700) BP: (75-108)/(51-77) 89/67 (03/06 0700) SpO2:  [90 %-100 %] 97 % (03/06 0700) FiO2 (%):  [30 %] 30 % (03/06 0310) Weight:  [71.5 kg] 71.5 kg (03/06 0416) Last BM Date : 02/23/23  Weight change: Filed Weights   02/26/23 0500 02/27/23 0400 02/28/23 0416  Weight: 76.4 kg 73.5 kg 71.5 kg    Intake/Output:   Intake/Output Summary (Last 24 hours) at 02/28/2023 0722 Last data filed at 02/28/2023 0700 Gross per 24 hour  Intake 1975.23 ml  Output 5870 ml  Net -3894.77 ml    CVP 13-14   Physical Exam    General:   No resp difficulty. In bed.  HEENT: normal Neck: supple. JVP 11-12  . Carotids 2+ bilat; no bruits. No lymphadenopathy or thryomegaly appreciated. Cor: PMI nondisplaced. Regular rate & rhythm. No rubs, gallops or murmurs. Zolll Pads in place.  Lungs: Decreased on 6 HFNC.  Abdomen: soft, nontender, nondistended. No hepatosplenomegaly. No bruits or masses. Good bowel sounds. Extremities: no cyanosis, clubbing, rash, edema. RUE PICC  Neuro: alert & orientedx3, cranial nerves grossly intact. moves all 4 extremities w/o difficulty. Affect flat.     Telemetry    SR 80s Multiple runs of NSVT.  EKG    N/A  Labs    CBC Recent Labs    02/27/23 1301  02/28/23 0245 02/28/23 0412  WBC 8.3 7.7  --   HGB 8.2* 8.3* 9.9*  HCT 28.0* 27.1* 29.0*  MCV 76.3* 73.6*  --   PLT 237 230  --    Basic Metabolic Panel Recent Labs    02/28/23 0224 02/28/23 0412 02/28/23 0423  NA 134* 127* 128*  K <2.0* 2.5* 2.4*  CL 98 76* 77*  CO2 26  --  37*  GLUCOSE 96 119* 115*  BUN 18 23 24*  CREATININE 0.68 1.20* 1.07*  CALCIUM 5.6*  --  8.4*  MG 1.3*  --  1.9   Liver Function Tests Recent Labs    02/26/23 1140  AST 223*  ALT 200*  ALKPHOS 193*  BILITOT 1.9*  PROT 6.3*  ALBUMIN 3.5   No results for input(s): "LIPASE", "AMYLASE" in the last 72 hours. Cardiac Enzymes No results for input(s): "CKTOTAL", "CKMB", "CKMBINDEX", "TROPONINI" in the last 72 hours.  BNP: BNP (last 3 results) Recent Labs    02/22/23 1534  BNP 876.3*    ProBNP (last 3 results) Recent Labs    02/21/23 1203  PROBNP 4,604*     D-Dimer No results for input(s): "DDIMER" in the last 72 hours. Hemoglobin A1C No results for input(s): "HGBA1C" in the last 72 hours. Fasting Lipid  Panel No results for input(s): "CHOL", "HDL", "LDLCALC", "TRIG", "CHOLHDL", "LDLDIRECT" in the last 72 hours. Thyroid Function Tests No results for input(s): "TSH", "T4TOTAL", "T3FREE", "THYROIDAB" in the last 72 hours.  Invalid input(s): "FREET3"  Other results:   Imaging    ECHOCARDIOGRAM COMPLETE  Result Date: 02/27/2023    ECHOCARDIOGRAM REPORT   Patient Name:   Stacy Jenkins Date of Exam: 02/27/2023 Medical Rec #:  [id number]       Height:       67.0 in Accession #:    [id number]      Weight:       162.0 lb Date of Birth:  1961-10-07       BSA:          1.849 m Patient Age:    62 years        BP:           94/60 mmHg Patient Gender: F               HR:           87 bpm. Exam Location:  Inpatient Procedure: 2D Echo, Cardiac Doppler, Color Doppler and 3D Echo Indications:    Mitral Valve insufficiency I34.0  History:        Patient has prior history of Echocardiogram examinations,  most                 recent 08/04/2022. CHF, Stroke, Aortic Valve Disease and Mitral                 Valve Disease; Risk Factors:Non-Smoker.                 Aortic Valve: unknown bioprosthetic valve is present in the                 aortic position.  Sonographer:    Wilkie Aye RVT RCS Referring Phys: [id number] RAVI [place]  1. Severe, torrential MR. Central jet. Appears functional. TEE recommended to clarify pathology. The mitral valve is abnormal. Severe mitral valve regurgitation. No evidence of mitral stenosis.  2. The tricuspid valve is abnormal. Tricuspid valve regurgitation is severe.  3. Left ventricular ejection fraction, by estimation, is 25 to 30%. The left ventricle has severely decreased function. The left ventricle demonstrates global hypokinesis. The left ventricular internal cavity size was severely dilated. Left ventricular diastolic function could not be evaluated.  4. Right ventricular systolic function is moderately reduced. The right ventricular size is moderately enlarged. There is mildly elevated pulmonary artery systolic pressure. The estimated right ventricular systolic pressure is 99991111 mmHg.  5. Left atrial size was severely dilated.  6. Right atrial size was severely dilated.  7. The aortic valve has been repaired/replaced. Aortic valve regurgitation is not visualized. There is a unknown bioprosthetic valve present in the aortic position. Echo findings are consistent with normal structure and function of the aortic valve prosthesis. Aortic valve area, by VTI measures 2.74 cm. Aortic valve mean gradient measures 10.7 mmHg. Aortic valve Vmax measures 2.20 m/s.  8. The inferior vena cava is dilated in size with <50% respiratory variability, suggesting right atrial pressure of 15 mmHg. Comparison(s): Changes from prior study are noted. The left ventricular function is worsened. Severe MR. Severe TR. LVEF now severely reduced. FINDINGS  Left Ventricle: Left ventricular ejection  fraction, by estimation, is 25 to 30%. The left ventricle has severely decreased function. The left ventricle demonstrates global hypokinesis. The left ventricular internal cavity size was severely  dilated. There is no left ventricular hypertrophy. Left ventricular diastolic function could not be evaluated due to mitral regurgitation (moderate or greater). Left ventricular diastolic function could not be evaluated. Right Ventricle: The right ventricular size is moderately enlarged. No increase in right ventricular wall thickness. Right ventricular systolic function is moderately reduced. There is mildly elevated pulmonary artery systolic pressure. The tricuspid regurgitant velocity is 2.65 m/s, and with an assumed right atrial pressure of 15 mmHg, the estimated right ventricular systolic pressure is 99991111 mmHg. Left Atrium: Left atrial size was severely dilated. Right Atrium: Right atrial size was severely dilated. Pericardium: There is no evidence of pericardial effusion. Mitral Valve: Severe, torrential MR. Central jet. Appears functional. TEE recommended to clarify pathology. The mitral valve is abnormal. Severe mitral valve regurgitation. No evidence of mitral valve stenosis. Tricuspid Valve: The tricuspid valve is abnormal. Tricuspid valve regurgitation is severe. No evidence of tricuspid stenosis. The flow in the hepatic veins is reversed during ventricular systole. Aortic Valve: The aortic valve has been repaired/replaced. Aortic valve regurgitation is not visualized. Aortic valve mean gradient measures 10.7 mmHg. Aortic valve peak gradient measures 19.4 mmHg. Aortic valve area, by VTI measures 2.74 cm. There is a  unknown bioprosthetic valve present in the aortic position. Echo findings are consistent with normal structure and function of the aortic valve prosthesis. Pulmonic Valve: The pulmonic valve was grossly normal. Pulmonic valve regurgitation is trivial. No evidence of pulmonic stenosis. Aorta: The  aortic root and ascending aorta are structurally normal, with no evidence of dilitation. Venous: The inferior vena cava is dilated in size with less than 50% respiratory variability, suggesting right atrial pressure of 15 mmHg. IAS/Shunts: The atrial septum is grossly normal.  LEFT VENTRICLE PLAX 2D LVIDd:         7.30 cm   Diastology LVIDs:         5.20 cm   LV e' medial:    5.63 cm/s LV PW:         1.00 cm   LV E/e' medial:  14.0 LV IVS:        0.80 cm   LV e' lateral:   14.10 cm/s LVOT diam:     2.17 cm   LV E/e' lateral: 5.6 LV SV:         89 LV SV Index:   48 LVOT Area:     3.70 cm                           3D Volume EF:                          3D EF:        66 %                          LV EDV:       261 ml                          LV ESV:       89 ml                          LV SV:        173 ml RIGHT VENTRICLE             IVC RV Basal diam:  4.80 cm     IVC diam: 3.80 cm RV Mid diam:    2.90 cm RV S prime:     11.90 cm/s TAPSE (M-mode): 1.6 cm LEFT ATRIUM              Index        RIGHT ATRIUM           Index LA diam:        5.60 cm  3.03 cm/m   RA Area:     19.30 cm LA Vol (A2C):   114.0 ml 61.65 ml/m  RA Volume:   49.80 ml  26.93 ml/m LA Vol (A4C):   117.0 ml 63.27 ml/m LA Biplane Vol: 123.0 ml 66.52 ml/m  AORTIC VALVE                     PULMONIC VALVE AV Area (Vmax):    2.81 cm      PV Vmax:       0.70 m/s AV Area (Vmean):   2.73 cm      PV Peak grad:  2.0 mmHg AV Area (VTI):     2.74 cm AV Vmax:           220.00 cm/s AV Vmean:          147.333 cm/s AV VTI:            0.326 m AV Peak Grad:      19.4 mmHg AV Mean Grad:      10.7 mmHg LVOT Vmax:         167.00 cm/s LVOT Vmean:        108.633 cm/s LVOT VTI:          0.242 m LVOT/AV VTI ratio: 0.74  AORTA Ao Root diam: 2.30 cm Ao Asc diam:  3.20 cm Ao Arch diam: 2.7 cm MITRAL VALVE               TRICUSPID VALVE MV Area (PHT): 4.08 cm    TR Peak grad:   28.1 mmHg MV Decel Time: 186 msec    TR Vmax:        265.00 cm/s MR Peak grad: 62.7 mmHg MR  Mean grad: 38.0 mmHg    SHUNTS MR Vmax:      396.00 cm/s  Systemic VTI:  0.24 m MR Vmean:     282.0 cm/s   Systemic Diam: 2.17 cm MV E velocity: 78.60 cm/s MV A velocity: 39.30 cm/s MV E/A ratio:  2.00 Eleonore Chiquito MD Electronically signed by Eleonore Chiquito MD Signature Date/Time: 02/27/2023/7:16:37 PM    Final    DG Swallowing Func-Speech Pathology  Result Date: 02/27/2023 Table formatting from the original result was not included. Objective Swallowing Evaluation: Type of Study: Bedside Swallow Evaluation  Patient Details Name: Stacy Jenkins MRN: [id number] Date of Birth: 1961/04/11 Today's Date: 02/27/2023 Time: SLP Start Time (ACUTE ONLY): 1210 -SLP Stop Time (ACUTE ONLY): [id number] SLP Time Calculation (min) (ACUTE ONLY): 24 min Past Medical History: Past Medical History: Diagnosis Date  Abnormal echocardiogram 08/20/2018  Added automatically from request for surgery [id number]  Acute blood loss anemia 11/19/2018  Acute hyperglycemia 03/10/2016  Acute post-operative pain 11/19/2018  Aortic valve regurgitation 08/20/2018  Formatting of this note might be different from the original. Added automatically from request for surgery [id number]  Aphasia   Ascending aortic aneurysm ([place])   ASD (atrial septal defect) 02/03/2023  Benign essential hypertension 08/09/2017  at goal on current meds  Formatting  of this note might be different from the original. at goal on current meds  Cardiomyopathy ([place]) 01/29/2018  Dilated aortic root ([place]) 09/11/2012  Formatting of this note might be different from the original. 4.6 cm  Elevated blood-pressure reading without diagnosis of hypertension 01/29/2018  will monitor  Formatting of this note might be different from the original. will monitor  Expressive aphasia 11/20/2018  Hiatal hernia 01/29/2018  History of atrial fibrillation 11/20/2018  History of CVA (cerebrovascular accident) 08/09/2017  History of migraine 01/29/2018  Hyperlipidemia 01/08/2019  Hypertension 01/08/2019  Hypoglycemia  01/29/2018  dietary info  Formatting of this note might be different from the original. dietary info  Medicare annual wellness visit, subsequent 02/17/2019  Mixed dyslipidemia 08/09/2017  Nonrheumatic aortic valve insufficiency   Other abnormal glucose 01/29/2018  PFO (patent foramen ovale) 09/11/2012  Postoperative atrial fibrillation ([place]) 11/20/2018  S/P ascending aortic aneurysm repair 11/20/2018  S/P AVR 11/19/2018  Stroke (cerebrum) ([place]) - Left MCA branchStroke in a patient with history of prior strokes and mild residual deficits, s/p tPA 07/13/2017  Stroke ([place])   Vitamin D deficiency 01/29/2018 Past Surgical History: Past Surgical History: Procedure Laterality Date  aortic valve surgery  02/2016  RIGHT HEART CATH N/A 02/26/2023  Procedure: RIGHT HEART CATH;  Surgeon: Hebert Soho, DO;  Location: [facility];  Service: Cardiovascular;  Laterality: N/A;  TEE WITHOUT CARDIOVERSION N/A 07/16/2017  Procedure: TRANSESOPHAGEAL ECHOCARDIOGRAM (TEE);  Surgeon: Sanda Klein, MD;  Location: [facility];  Service: Cardiovascular;  Laterality: N/A; HPI: Stacy Jenkins is a 62 y.o. 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  She was diagnosed with acute respiratory, acute on chronic systolic heart failure and afib with RVR.  Chest xray was showing moderate pulmonary edema with small right pleural effusion.  Subjective: Patient was seen sitting in her bedside chair.  Recommendations for follow up therapy are one component of a multi-disciplinary discharge planning process, led by the attending physician.  Recommendations may be updated based on patient status, additional functional criteria and insurance authorization. Assessment / Plan / Recommendation History  of Present Illness Stacy Jenkins is a 62 y.o. 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  She was diagnosed with acute respiratory, acute on chronic systolic heart failure and afib with RVR.  Chest xray was showing moderate pulmonary edema with small right pleural effusion.   Clinical Impression Stacy Jenkins presented with a mild oropharyngeal dysphagia marked by intermittent, trace aspiration of thin liquids, usually occurring during sequential swallows.  Aspiration was accompanied by an initial cough.  A chin tuck did not appear to offer protection.  There was prolonged and disorganized oral preparation, adequate pharyngeal clearance, and incomplete closure of the laryngeal vestibule, allowing thin liquids to slip in and settle on the vocal folds and then transition below.  Recommend thickening liquids to nectar for now; continue regular consistency solids. SLP will follow for timing/closure strategies. Factors that may increase risk of adverse event in presence of aspiration Phineas Douglas & Padilla 2021):   Swallow Evaluation Recommendations Recommendations: PO diet PO Diet Recommendation: Regular;Mildly thick liquids (Level 2, nectar thick) Liquid Administration via: Cup;Straw Medication  Administration: Whole meds with liquid Supervision: Patient able to self-feed Swallowing strategies  : Slow rate;Small bites/sips Oral care recommendations: Oral care BID (2x/day)  Amanda L. Couture, MA CCC/SLP Clinical Specialist - Acute Care SLP Acute Rehabilitation Services Office number [phone]                    Medications:     Scheduled Medications:  Chlorhexidine Gluconate Cloth  6 each Topical Daily   furosemide  80 mg Intravenous BID   insulin aspart  0-15 Units  Subcutaneous TID WC   insulin aspart  0-5 Units Subcutaneous QHS   mouth rinse  15 mL Mouth Rinse 4 times per day   potassium chloride  40 mEq Oral BID   rosuvastatin  5 mg Oral Daily   sodium chloride flush  10-40 mL Intracatheter Q12H   sodium chloride flush  3 mL Intravenous Q12H   sodium chloride flush  3 mL Intravenous Q12H   sodium chloride flush  3 mL Intravenous Q12H   spironolactone  25 mg Oral Daily    Infusions:  sodium chloride 250 mL (02/28/23 0500)   sodium chloride Stopped (02/28/23 0603)   sodium chloride     amiodarone 30 mg/hr (02/28/23 0700)   heparin 800 Units/hr (02/28/23 0700)   norepinephrine (LEVOPHED) Adult infusion 5 mcg/min (02/28/23 0700)   potassium chloride 10 mEq (02/28/23 0709)    PRN Medications: sodium chloride, sodium chloride, acetaminophen, LORazepam, ondansetron (ZOFRAN) IV, mouth rinse, sodium chloride flush, sodium chloride flush, sodium chloride flush, traZODone    Patient Profile   Stacy Jenkins is a 62 year old with a history of CVA, hypertension, hyperlipidemia, minimal CAD, patent foramen ovale, chronic systolic heart failure, 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    Admitted 02/22/2023 for the evaluation of dyspnea.   Assessment/Plan     1.  Shock --> Suspect Cardiogenic -Most recent ECHO EF 45-50% at [facility]. Will need to repeat. Concerned EF will be worse due to ongoing A fib RVR.  Persistent hypotension with elevated lactic acid. CO2 27 SCAI C. Advanced Heart Failure Team Consulted  Lactic acid clearing 3.4>1.4  RHC elevated filling pressures CO 5.3 CI 2.8 t - Remains on Norepi 5 mcg. CO-OX 60%  - CVP 13-14. K 2. Hold diuretics and allow K to come up.    2. A fib RVR  - Back in SR.  - Continue amio 60 mg per hour. .  - Continue heparin drip -Supp K    3. Acute Hypoxic Respiratory Failure ,  OSA- on Bipap  - Sats stable HFNC 6 liters.   -  CCM following.    4. MR  Will need TEE to further assess once diuresed. Will need GOC first.    5.  Anemia  -Recent GI bleed 01/2023  -2024 EGD/Colonoscopy with diverticulosis.  -Hgb trending down 10.5>8>8.3 -Check FOBT.  - Follow daily CBCs.    6. CAD 2019 Non obstructive CAD-->10% left main, <25% mid LAD, <25% RCA lesion.  - No chest pain.    7. H/O Bioprosthetic AVR 2019    8. DMII  -Hgb A1C 8 -Continue SSI    9. H/O CVA   10. Hypokalemia K 2. Supp K. Check BMET after K runs.   11. NSVT Multiple runs of NSVT over night.  - Increase amio 60 mg. Aggressive replace K.  - Mag 1.9. Give 2 grams Mag.   12. GOC She  told staff and me that she wants to die.  Consult Palliative Care for [place]. Need clear understanding of what her goals are before pursuing further procedures. Will need to involve her son/husband.    Will ask psychiatry to see    Length of Stay: [place], NP  02/28/2023, 7:22 AM  Advanced Heart Failure Team Pager [phone] (M-F; [place])  Please contact [facility] for night-coverage after hours (5p -7a ) and weekends on [email or web address]

## 2023-02-28 NOTE — Progress Notes (Signed)
Initial Nutrition Assessment  DOCUMENTATION CODES:   Non-severe (moderate) malnutrition in context of chronic illness  INTERVENTION:   If po intake remains inadequate on follow-up, recommend liberalizing diet to Regular (liquid consistency per SLP recommendations)  Magic cup at [employer], each supplement provides 290 kcal and 9 grams of protein  Mighty Shake (nectar) at Breakfast, each 6 ounce supplement provides 330 kcal and 9 g of protein.    NUTRITION DIAGNOSIS:   Moderate Malnutrition related to chronic illness as evidenced by edema, mild muscle depletion, mild fat depletion.   GOAL:   Patient will meet greater than or equal to 90% of their needs   MONITOR:   PO intake, Supplement acceptance, Labs, Weight trends  REASON FOR ASSESSMENT:   Consult Assessment of nutrition requirement/status  ASSESSMENT:    63 yo female admitted with acute on chronic heart failure, cardiogenic shock. PMH includes stroke, HTM  Noted Psych and Palliative Care have been consulted today. Pt feels terrible, telling MD she "wants to die."  Noted pt with some degree of cognitive impairment at baseline post CVAs, +aphasia.   Noted diet downgraded to Nectar Thick Liquids, remains on 2g sodium   Pt sitting up in chair, husband at bedside on visit today. Pt eating lunch and ate 100% of spaghetti with meat sauce, eating some pudding and sipping on nectar thick tea. Pt reports she ate bread for breakfast. Husband then asks patient if she ate her eggs and sausage to which pt replies "yes I ate eggs, sausage and 3 pieces of bread." Discussed with RN who indicates pt did not eat her eggs and sausage this AM, there were crumbs on the meal tray but pt only ordered eggs and sausage so unsure bread she ate. Regardless, hard to get clear indication of pt's intake at this time other than what is recorded and what RD observed at lunch tray. Husband reports pt was eating well at home, not taking any oral  nutrition supplements but taking MVI, B complex, fish pil and CoQ10  Recorded po intake 25-60% (<50% on average)  UOP 5.8 L in 24 hours. Weight down to 71.5 kg. Admit wt 79.3 kg. Net negative 10 L since admission. Pt reports UBW around 150-155 pounds, husband reports her UBW is closer to 175 pounds. Unsure if pt's UBW was a dry weight, admit weight 174.6, down to 168 2 days in and now 158 pounds. Per weight encounters, it does appear that pt may have lost weight but unclear if weights are dry weights.  Wt Readings from Last 10 Encounters:  02/28/23 71.5 kg  02/22/23 77.1 kg  02/21/23 79.8 kg  08/01/22 76.6 kg  01/19/22 79.2 kg  07/14/21 78.6 kg  01/26/21 80.6 kg  10/25/20 79.8 kg  05/17/20 78 kg  03/05/20 79.8 kg     +constipation; no BM since 3/01. Pt denies any abdominal discomfort or pain, denies N/V.   Per husband, pt able to ambulate and perform ADLs on her own at baseline, currently very weak but able to walk with PT per husband.   Labs: sodium 128 (L), potasium 2.4 (L) Meds: ss novolog, KCl   NUTRITION - FOCUSED PHYSICAL EXAM:  Flowsheet Row Most Recent Value  Orbital Region Mild depletion  Upper Arm Region No depletion  Thoracic and Lumbar Region Mild depletion  Buccal Region Mild depletion  Temple Region Mild depletion  Clavicle Bone Region Mild depletion  Clavicle and Acromion Bone Region Mild depletion  Scapular Bone Region Mild depletion  Dorsal Hand Mild depletion  Patellar Region Moderate depletion  Anterior Thigh Region Moderate depletion  Posterior Calf Region Unable to assess  [compression bandage wraps in place]  Edema (RD Assessment) Mild  [unable to assess calf and feet due to wraps]       Diet Order:   Diet Order             Diet 2 gram sodium Room service appropriate? Yes with Assist; Fluid consistency: Nectar Thick  Diet effective now                   EDUCATION NEEDS:   Not appropriate for education at this time  Skin:  Skin  Assessment: Reviewed RN Assessment  Last BM:  3/01  Height:   Ht Readings from Last 1 Encounters:  02/26/23 '5\' 7"'$  (1.702 m)    Weight:   Wt Readings from Last 1 Encounters:  02/28/23 71.5 kg    BMI:  Body mass index is 24.69 kg/m.  Estimated Nutritional Needs:   Kcal:  1600-1800  Protein:  75-85 g  Fluid:  1.6 L   Kerman Passey MS, RDN, LDN, CNSC Registered Dietitian 3 Clinical Nutrition RD Pager and On-Call Pager Number Located in [place]

## 2023-02-28 NOTE — Progress Notes (Addendum)
ANTICOAGULATION CONSULT NOTE - Follow Up Consult  Pharmacy Consult for heparin > apixaban Indication: atrial fibrillation  Labs: Recent Labs    02/26/23 1140 02/27/23 0214 02/27/23 1301 02/27/23 1751 02/28/23 0224 02/28/23 0245 02/28/23 0412 02/28/23 0423  HGB  --  8.0* 8.2*  --   --  8.3* 9.9*  --   HCT  --  26.5* 28.0*  --   --  27.1* 29.0*  --   PLT  --  224 237  --   --  230  --   --   APTT  --  >200* 71*  --   --  60*  --   --   LABPROT 25.6*  --   --   --   --   --   --   --   INR 2.4*  --   --   --   --   --   --   --   HEPARINUNFRC  --  >1.10* >1.10*  --   --  >1.10*  --   --   CREATININE  --  1.18*  --    < > 0.68  --  1.20* 1.07*   < > = values in this interval not displayed.     Assessment: 62yo female with Hx Afib on apixaban PTA and admitted with HF exacerbation. Hold apixaban for now for planned procedures  Initial aptt was supratherapeutic on heparin  Heparin drip rate 800 uts/hr  aptt 60 sec slightly less than goal - no change for now  - hgb low stable 8-9s Heparin leve elevated in setting of apixaban use causes false elevation of heparin level  - use aptt for heparin dosing   no infusion issues or signs of bleeding per RN.  Marland Kitchen   PM discussion > potential DCCV later thi s week will resume apixaban   Goal of Therapy:  aPTT 66-102 seconds   Plan:  Continue heparin drip  800 units/hr > stop and restart apixaban '5mg'$  BID Daily aptt and cbc  Monitor s/s bleeding     Bonnita Nasuti Pharm.D. CPP, BCPS Clinical Pharmacist [phone] 02/28/2023 7:31 AM

## 2023-03-01 ENCOUNTER — Inpatient Hospital Stay (HOSPITAL_COMMUNITY): Payer: PPO | Admitting: Anesthesiology

## 2023-03-01 ENCOUNTER — Encounter (HOSPITAL_COMMUNITY): Payer: Self-pay | Admitting: Cardiology

## 2023-03-01 ENCOUNTER — Encounter (HOSPITAL_COMMUNITY)
Admission: EM | Disposition: A | Discharge status: Home / Self Care | Payer: Self-pay | Source: Home / Self Care | Attending: Internal Medicine

## 2023-03-01 ENCOUNTER — Inpatient Hospital Stay (HOSPITAL_COMMUNITY): Payer: PPO

## 2023-03-01 DIAGNOSIS — I081 Rheumatic disorders of both mitral and tricuspid valves: Secondary | ICD-10-CM

## 2023-03-01 DIAGNOSIS — I10 Essential (primary) hypertension: Secondary | ICD-10-CM | POA: Diagnosis not present

## 2023-03-01 DIAGNOSIS — D649 Anemia, unspecified: Secondary | ICD-10-CM

## 2023-03-01 DIAGNOSIS — E440 Moderate protein-calorie malnutrition: Secondary | ICD-10-CM | POA: Insufficient documentation

## 2023-03-01 DIAGNOSIS — I340 Nonrheumatic mitral (valve) insufficiency: Secondary | ICD-10-CM

## 2023-03-01 DIAGNOSIS — K449 Diaphragmatic hernia without obstruction or gangrene: Secondary | ICD-10-CM | POA: Diagnosis not present

## 2023-03-01 DIAGNOSIS — I5023 Acute on chronic systolic (congestive) heart failure: Secondary | ICD-10-CM | POA: Diagnosis not present

## 2023-03-01 DIAGNOSIS — R570 Cardiogenic shock: Secondary | ICD-10-CM | POA: Diagnosis not present

## 2023-03-01 DIAGNOSIS — F3289 Other specified depressive episodes: Secondary | ICD-10-CM

## 2023-03-01 HISTORY — PX: TEE WITHOUT CARDIOVERSION: SHX5443

## 2023-03-01 HISTORY — DX: Moderate protein-calorie malnutrition: E44.0

## 2023-03-01 HISTORY — DX: Nonrheumatic mitral (valve) insufficiency: I34.0

## 2023-03-01 LAB — CBC
HCT: 27.1 % — ABNORMAL LOW (ref 36.0–46.0)
Hemoglobin: 8 g/dL — ABNORMAL LOW (ref 12.0–15.0)
MCH: 22.4 pg — ABNORMAL LOW (ref 26.0–34.0)
MCHC: 29.5 g/dL — ABNORMAL LOW (ref 30.0–36.0)
MCV: 75.9 fL — ABNORMAL LOW (ref 80.0–100.0)
Platelets: 247 10*3/uL (ref 150–400)
RBC: 3.57 MIL/uL — ABNORMAL LOW (ref 3.87–5.11)
RDW: 22.5 % — ABNORMAL HIGH (ref 11.5–15.5)
WBC: 9 10*3/uL (ref 4.0–10.5)
nRBC: 1.7 % — ABNORMAL HIGH (ref 0.0–0.2)

## 2023-03-01 LAB — COOXEMETRY PANEL
Carboxyhemoglobin: 1.8 % — ABNORMAL HIGH (ref 0.5–1.5)
Methemoglobin: <0.7 % (ref 0.0–1.5)
O2 Saturation: 67 %
Total hemoglobin: 8.2 g/dL — ABNORMAL LOW (ref 12.0–16.0)

## 2023-03-01 LAB — GLUCOSE, CAPILLARY
Glucose-Capillary: 152 mg/dL — ABNORMAL HIGH (ref 70–99)
Glucose-Capillary: 137 mg/dL — ABNORMAL HIGH (ref 70–99)
Glucose-Capillary: 164 mg/dL — ABNORMAL HIGH (ref 70–99)

## 2023-03-01 LAB — BASIC METABOLIC PANEL
Anion gap: 9 (ref 5–15)
Anion gap: 10 (ref 5–15)
BUN: 23 mg/dL (ref 8–23)
BUN: 19 mg/dL (ref 8–23)
CO2: 36 mmol/L — ABNORMAL HIGH (ref 22–32)
CO2: 38 mmol/L — ABNORMAL HIGH (ref 22–32)
Calcium: 8.3 mg/dL — ABNORMAL LOW (ref 8.9–10.3)
Calcium: 8.4 mg/dL — ABNORMAL LOW (ref 8.9–10.3)
Chloride: 82 mmol/L — ABNORMAL LOW (ref 98–111)
Chloride: 80 mmol/L — ABNORMAL LOW (ref 98–111)
Creatinine, Ser: 1.01 mg/dL — ABNORMAL HIGH (ref 0.44–1.00)
Creatinine, Ser: 1 mg/dL (ref 0.44–1.00)
GFR, Estimated: >60 mL/min (ref >60)
GFR, Estimated: >60 mL/min (ref >60)
Glucose, Bld: 171 mg/dL — ABNORMAL HIGH (ref 70–99)
Glucose, Bld: 146 mg/dL — ABNORMAL HIGH (ref 70–99)
Potassium: 3.9 mmol/L (ref 3.5–5.1)
Potassium: 3.8 mmol/L (ref 3.5–5.1)
Sodium: 127 mmol/L — ABNORMAL LOW (ref 135–145)
Sodium: 128 mmol/L — ABNORMAL LOW (ref 135–145)

## 2023-03-01 LAB — MAGNESIUM
Magnesium: 2.1 mg/dL (ref 1.7–2.4)
Magnesium: 1.9 mg/dL (ref 1.7–2.4)

## 2023-03-01 LAB — ECHO TEE

## 2023-03-01 SURGERY — ECHOCARDIOGRAM, TRANSESOPHAGEAL
Anesthesia: Monitor Anesthesia Care

## 2023-03-01 MED ORDER — LACTATED RINGERS IV SOLN: INTRAVENOUS | Status: DC

## 2023-03-01 MED ORDER — POTASSIUM CHLORIDE CRYS ER 20 MEQ PO TBCR: 20.0000 meq | EXTENDED_RELEASE_TABLET | Freq: Once | ORAL | Status: DC

## 2023-03-01 MED ORDER — PROPOFOL 500 MG/50ML IV EMUL
INTRAVENOUS | Status: DC | PRN
  Administered 2023-03-01: 50 ug/kg/min via INTRAVENOUS

## 2023-03-01 MED ORDER — ETOMIDATE 2 MG/ML IV SOLN
INTRAVENOUS | Status: DC | PRN
  Administered 2023-03-01: 2 mg via INTRAVENOUS
  Administered 2023-03-01: 6 mg via INTRAVENOUS

## 2023-03-01 MED ORDER — POTASSIUM CHLORIDE 20 MEQ PO PACK
20.0000 meq | PACK | Freq: Once | ORAL | Status: AC
  Administered 2023-03-01: 20 meq via ORAL
  Filled 2023-03-01: qty 1

## 2023-03-01 MED ORDER — PHENYLEPHRINE 80 MCG/ML (10ML) SYRINGE FOR IV PUSH (FOR BLOOD PRESSURE SUPPORT)
PREFILLED_SYRINGE | INTRAVENOUS | Status: DC | PRN
  Administered 2023-03-01 (×2): 160 ug via INTRAVENOUS

## 2023-03-01 MED ORDER — MIDODRINE HCL 5 MG PO TABS
5.0000 mg | ORAL_TABLET | Freq: Three times a day (TID) | ORAL | Status: DC
  Administered 2023-03-01 – 2023-03-02 (×5): 5 mg via ORAL
  Filled 2023-03-01 (×5): qty 1

## 2023-03-01 MED ORDER — MAGNESIUM SULFATE 2 GM/50ML IV SOLN
2.0000 g | Freq: Once | INTRAVENOUS | Status: AC
  Administered 2023-03-01: 2 g via INTRAVENOUS
  Filled 2023-03-01: qty 50

## 2023-03-01 MED ORDER — PROPOFOL 10 MG/ML IV BOLUS
INTRAVENOUS | Status: DC | PRN
  Administered 2023-03-01 (×3): 20 mg via INTRAVENOUS

## 2023-03-01 NOTE — Consult Note (Signed)
[facility] Consult   Reason for Consult:  depression Referring Physician:  Dr. Daniel Nones Patient Identification: Stacy Jenkins MRN:  [id number] Principal Diagnosis: Acute on chronic systolic heart failure ([place]) Diagnosis:  Principal Problem:   Acute on chronic systolic heart failure ([place]) Active Problems:   Malnutrition of moderate degree   Nonrheumatic mitral valve regurgitation   Total Time spent with patient: 1 hour  Subjective:   Stacy Jenkins is a 62 y.o. 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  Patient with shock, cardiogenic suspected.  Remains on norepinephrine drip.  Diuretics held due to severe hypokalemia.  Also struggling with some dysphagia.  Requiring high flow nasal cannula, BiPAP at night.  Per chart review patient expressing to different staff members she does not want to continue with aggressive medical care.  Marland Kitchen  HPI:  Pt interviewed alone in room. Pt reports "I want to be home". Pt reports feeling mildly depressed about being in the hospital for the past 2 wks. Pt reports her mood was good at home, and her mood will be better after she goes home. Pt reports "I don't give a rip, I would rather die than live in the hospital". Pt states she would not hurt herself in the hospital, and contracts for safety here. Pt wants to live for her husband and son. Pt slept better last night, and appetite is okay (but would rather eat hamburgers and hot dogs). Pt denies crying spells. Pt denies HI/AVH/paranoia. Pt knows she had a procedure this morning. Pt is well-oriented to her age, location, city, and date.  Past Psychiatric History: Pt denies any history of psychiatrist/therapist/psych meds/suicide  attempts.   Risk to Self:   Risk to Others:   Prior Inpatient Therapy:   Prior Outpatient Therapy:    Past Medical History:  Past Medical History:  Diagnosis Date   Abnormal echocardiogram 08/20/2018   Added automatically from request for surgery [id number]   Acute blood loss anemia 11/19/2018   Acute hyperglycemia 03/10/2016   Acute post-operative pain 11/19/2018   Aortic valve regurgitation 08/20/2018   Formatting of this note might be different from the original. Added automatically from request for surgery [id number]   Aphasia    Ascending aortic aneurysm ([place])    ASD (atrial septal defect) 02/03/2023   Benign essential hypertension 08/09/2017   at goal on current meds  Formatting of this note might be different from the original. at goal on current meds   Cardiomyopathy ([place]) 01/29/2018   Dilated aortic root ([place]) 09/11/2012   Formatting of this note might be different from the original. 4.6 cm   Elevated blood-pressure reading without diagnosis of hypertension 01/29/2018   will monitor  Formatting of this note might be different from the original. will monitor   Expressive aphasia 11/20/2018   Hiatal hernia 01/29/2018   History of atrial fibrillation 11/20/2018   History of CVA (cerebrovascular accident) 08/09/2017   History of migraine 01/29/2018   Hyperlipidemia 01/08/2019   Hypertension 01/08/2019   Hypoglycemia 01/29/2018   dietary info  Formatting of this note might be different from the original. dietary info   Medicare annual wellness visit, subsequent 02/17/2019   Mixed dyslipidemia 08/09/2017   Nonrheumatic aortic valve insufficiency    Other abnormal glucose 01/29/2018   PFO (patent foramen ovale) 09/11/2012  Postoperative atrial fibrillation ([place]) 11/20/2018   S/P ascending aortic aneurysm repair 11/20/2018   S/P AVR 11/19/2018   Stroke (cerebrum) ([place]) - Left MCA branchStroke in a patient with history of prior strokes and mild residual deficits, s/p tPA  07/13/2017   Stroke ([place])    Vitamin D deficiency 01/29/2018    Past Surgical History:  Procedure Laterality Date   aortic valve surgery  02/2016   RIGHT HEART CATH N/A 02/26/2023   Procedure: RIGHT HEART CATH;  Surgeon: Hebert Soho, DO;  Location: [facility];  Service: Cardiovascular;  Laterality: N/A;   TEE WITHOUT CARDIOVERSION N/A 07/16/2017   Procedure: TRANSESOPHAGEAL ECHOCARDIOGRAM (TEE);  Surgeon: Sanda Klein, MD;  Location: [facility];  Service: Cardiovascular;  Laterality: N/A;   Family History:  Family History  Problem Relation Age of Onset   Hypertension Mother    Heart disease Father    Heart attack Father    Heart disease Brother    Family Psychiatric  History: unremarkable. Social History:  Social History   Substance and Sexual Activity  Alcohol Use No     Social History   Substance and Sexual Activity  Drug Use No    Social History   Socioeconomic History   Marital status: Married    Spouse name: Not on file   Number of children: Not on file   Years of education: Not on file   Highest education level: Not on file  Occupational History   Not on file  Tobacco Use   Smoking status: Never   Smokeless tobacco: Never  Vaping Use   Vaping Use: Never used  Substance and Sexual Activity   Alcohol use: No   Drug use: No   Sexual activity: Not on file  Other Topics Concern   Not on file  Social History Narrative   Not on file   Social Determinants of Health   Financial Resource Strain: Not on file  Food Insecurity: No Food Insecurity (10/05/2022)   Hunger Vital Sign    Worried About Running Out of Food in the Last Year: Never true    Ran Out of Food in the Last Year: Never true  Transportation Needs: No Transportation Needs (10/05/2022)   PRAPARE - Hydrologist (Medical): No    Lack of Transportation (Non-Medical): No  Physical Activity: Not on file  Stress: Not on file  Social Connections: Not on  file   Additional Social History:    Allergies:   Allergies  Allergen Reactions   Penicillin G Rash    Pt states she is not allergic    Labs:  Results for orders placed or performed during the hospital encounter of 02/22/23 (from the past 48 hour(s))  Glucose, capillary     Status: Abnormal   Collection Time: 02/27/23  4:21 PM  Result Value Ref Range   Glucose-Capillary 115 (H) 70 - 99 mg/dL    Comment: Glucose reference range applies only to samples taken after fasting for at least 8 hours.  Basic metabolic panel     Status: Abnormal   Collection Time: 02/27/23  5:51 PM  Result Value Ref Range   Sodium 126 (L) 135 - 145 mmol/L   Potassium 3.7 3.5 - 5.1 mmol/L   Chloride 81 (L) 98 - 111 mmol/L   CO2 36 (H) 22 - 32 mmol/L   Glucose, Bld 199 (H) 70 - 99 mg/dL    Comment: Glucose reference range applies only to samples taken  after fasting for at least 8 hours.   BUN 25 (H) 8 - 23 mg/dL   Creatinine, Ser 1.27 (H) 0.44 - 1.00 mg/dL   Calcium 8.4 (L) 8.9 - 10.3 mg/dL   GFR, Estimated 48 (L) >60 mL/min    Comment: (NOTE) Calculated using the CKD-EPI Creatinine Equation (2021)    Anion gap 9 5 - 15    Comment: Performed at [facility] [address], [place], [place] [address]  Glucose, capillary     Status: Abnormal   Collection Time: 02/27/23  9:26 PM  Result Value Ref Range   Glucose-Capillary 68 (L) 70 - 99 mg/dL    Comment: Glucose reference range applies only to samples taken after fasting for at least 8 hours.  Glucose, capillary     Status: Abnormal   Collection Time: 02/27/23 10:06 PM  Result Value Ref Range   Glucose-Capillary 201 (H) 70 - 99 mg/dL    Comment: Glucose reference range applies only to samples taken after fasting for at least 8 hours.  Glucose, capillary     Status: Abnormal   Collection Time: 02/28/23  2:20 AM  Result Value Ref Range   Glucose-Capillary 118 (H) 70 - 99 mg/dL    Comment: Glucose reference range applies only to samples taken  after fasting for at least 8 hours.  Magnesium     Status: Abnormal   Collection Time: 02/28/23  2:24 AM  Result Value Ref Range   Magnesium 1.3 (L) 1.7 - 2.4 mg/dL    Comment: Performed at [facility] [address], [place], [place] [id number]  Basic metabolic panel     Status: Abnormal   Collection Time: 02/28/23  2:24 AM  Result Value Ref Range   Sodium 134 (L) 135 - 145 mmol/L    Comment: DELTA CHECK NOTED   Potassium <2.0 (LL) 3.5 - 5.1 mmol/L    Comment: CRITICAL RESULT CALLED TO, READ BACK BY AND VERIFIED WITH  Esaw Dace, RN, [phone], 02/28/23, [place]   Chloride 98 98 - 111 mmol/L   CO2 26 22 - 32 mmol/L   Glucose, Bld 96 70 - 99 mg/dL    Comment: Glucose reference range applies only to samples taken after fasting for at least 8 hours.   BUN 18 8 - 23 mg/dL   Creatinine, Ser 0.68 0.44 - 1.00 mg/dL   Calcium 5.6 (LL) 8.9 - 10.3 mg/dL    Comment: DELTA CHECK NOTED CRITICAL RESULT CALLED TO, READ BACK BY AND VERIFIED WITH N. PRICHAID, RN, [phone], 02/28/23, E[place]    GFR, Estimated >60 >60 mL/min    Comment: (NOTE) Calculated using the CKD-EPI Creatinine Equation (2021)    Anion gap 10 5 - 15    Comment: ELECTROLYTES REPEATED TO VERIFY Performed at [facility], [place] [address], [place], [place] [address]   Glucose, capillary     Status: Abnormal   Collection Time: 02/28/23  2:24 AM  Result Value Ref Range   Glucose-Capillary 136 (H) 70 - 99 mg/dL    Comment: Glucose reference range applies only to samples taken after fasting for at least 8 hours.  Heparin level (unfractionated)     Status: Abnormal   Collection Time: 02/28/23  2:45 AM  Result Value Ref Range   Heparin Unfractionated >1.10 (H) 0.30 - 0.70 IU/mL    Comment: (NOTE) The clinical reportable range upper limit is being lowered to >1.10 to align with the FDA approved guidance for the current  laboratory assay.  If heparin results are below expected values, and patient dosage has  been confirmed,  suggest follow up testing of antithrombin III levels. Performed at [facility], [place] [address], [place], [place] [address]   Cooxemetry Panel (carboxy, met, total hgb, O2 sat)     Status: Abnormal   Collection Time: 02/28/23  2:45 AM  Result Value Ref Range   Total hemoglobin 8.6 (L) 12.0 - 16.0 g/dL   O2 Saturation 59.9 %   Carboxyhemoglobin 1.4 0.5 - 1.5 %   Methemoglobin 0.7 0.0 - 1.5 %    Comment: Performed at [facility], [place] [address] [place], [place] [address]  CBC     Status: Abnormal   Collection Time: 02/28/23  2:45 AM  Result Value Ref Range   WBC 7.7 4.0 - 10.5 K/uL   RBC 3.68 (L) 3.87 - 5.11 MIL/uL   Hemoglobin 8.3 (L) 12.0 - 15.0 g/dL    Comment: Reticulocyte Hemoglobin testing may be clinically indicated, consider ordering this additional test [id number]    HCT 27.1 (L) 36.0 - 46.0 %   MCV 73.6 (L) 80.0 - 100.0 fL   MCH 22.6 (L) 26.0 - 34.0 pg   MCHC 30.6 30.0 - 36.0 g/dL   RDW 21.7 (H) 11.5 - 15.5 %   Platelets 230 150 - 400 K/uL    Comment: REPEATED TO VERIFY   nRBC 1.4 (H) 0.0 - 0.2 %    Comment: Performed at [facility], [place] [address] [place], [place] [address]  APTT     Status: Abnormal   Collection Time: 02/28/23  2:45 AM  Result Value Ref Range   aPTT 60 (H) 24 - 36 seconds    Comment:        IF BASELINE aPTT IS ELEVATED, SUGGEST PATIENT RISK ASSESSMENT BE USED TO DETERMINE APPROPRIATE ANTICOAGULANT THERAPY. Performed at [facility], [place] [address] [place], [place] [address]   I-STAT, [place] 8     Status: Abnormal   Collection Time: 02/28/23  4:12 AM  Result Value Ref Range   Sodium 127 (L) 135 - 145 mmol/L   Potassium 2.5 (LL) 3.5 - 5.1 mmol/L   Chloride 76 (L) 98 - 111 mmol/L   BUN 23 8 - 23 mg/dL   Creatinine, Ser 1.20 (H) 0.44 - 1.00 mg/dL   Glucose, Bld 119 (H) 70 - 99 mg/dL    Comment: Glucose reference range applies only to samples taken after fasting for at least 8 hours.   Calcium, Ion 1.05 (L) 1.15 -  1.40 mmol/L   TCO2 39 (H) 22 - 32 mmol/L   Hemoglobin 9.9 (L) 12.0 - 15.0 g/dL   HCT 29.0 (L) 36.0 - 46.0 %   Comment VALUES EXPECTED, NO REPEAT   Basic metabolic panel     Status: Abnormal   Collection Time: 02/28/23  4:23 AM  Result Value Ref Range   Sodium 128 (L) 135 - 145 mmol/L   Potassium 2.4 (LL) 3.5 - 5.1 mmol/L    Comment: CRITICAL RESULT CALLED TO, READ BACK BY AND VERIFIED WITH Martyn Malay, RN, [phone], 02/28/23, E[place]UN   Chloride 77 (L) 98 - 111 mmol/L   CO2 37 (H) 22 - 32 mmol/L   Glucose, Bld 115 (H) 70 - 99 mg/dL    Comment: Glucose reference range applies only to samples taken after fasting for at least 8 hours.   BUN 24 (H) 8 - 23 mg/dL  Creatinine, Ser 1.07 (H) 0.44 - 1.00 mg/dL   Calcium 8.4 (L) 8.9 - 10.3 mg/dL    Comment: DELTA CHECK NOTED   GFR, Estimated 58 (L) >60 mL/min    Comment: (NOTE) Calculated using the CKD-EPI Creatinine Equation (2021)    Anion gap 14 5 - 15    Comment: Performed at [facility] [address], [place], [place] [address]  Magnesium     Status: None   Collection Time: 02/28/23  4:23 AM  Result Value Ref Range   Magnesium 1.9 1.7 - 2.4 mg/dL    Comment: Performed at [facility] [address] [place], [place] [address]  Glucose, capillary     Status: Abnormal   Collection Time: 02/28/23  6:07 AM  Result Value Ref Range   Glucose-Capillary 117 (H) 70 - 99 mg/dL    Comment: Glucose reference range applies only to samples taken after fasting for at least 8 hours.  Glucose, capillary     Status: Abnormal   Collection Time: 02/28/23 11:55 AM  Result Value Ref Range   Glucose-Capillary 179 (H) 70 - 99 mg/dL    Comment: Glucose reference range applies only to samples taken after fasting for at least 8 hours.  Glucose, capillary     Status: Abnormal   Collection Time: 02/28/23  4:32 PM  Result Value Ref Range   Glucose-Capillary 175 (H) 70 - 99 mg/dL    Comment: Glucose reference range applies only to samples taken  after fasting for at least 8 hours.  Basic metabolic panel     Status: Abnormal   Collection Time: 02/28/23  9:05 PM  Result Value Ref Range   Sodium 127 (L) 135 - 145 mmol/L   Potassium 4.2 3.5 - 5.1 mmol/L   Chloride 82 (L) 98 - 111 mmol/L   CO2 33 (H) 22 - 32 mmol/L   Glucose, Bld 139 (H) 70 - 99 mg/dL    Comment: Glucose reference range applies only to samples taken after fasting for at least 8 hours.   BUN 28 (H) 8 - 23 mg/dL   Creatinine, Ser 1.18 (H) 0.44 - 1.00 mg/dL   Calcium 8.7 (L) 8.9 - 10.3 mg/dL   GFR, Estimated 52 (L) >60 mL/min    Comment: (NOTE) Calculated using the CKD-EPI Creatinine Equation (2021)    Anion gap 12 5 - 15    Comment: Performed at [facility] [address], [place], [place] [address]  Glucose, capillary     Status: Abnormal   Collection Time: 02/28/23  9:31 PM  Result Value Ref Range   Glucose-Capillary 147 (H) 70 - 99 mg/dL    Comment: Glucose reference range applies only to samples taken after fasting for at least 8 hours.  Magnesium     Status: None   Collection Time: 03/01/23  4:30 AM  Result Value Ref Range   Magnesium 2.1 1.7 - 2.4 mg/dL    Comment: Performed at [facility] [address] [place], [place] [id number]  Basic metabolic panel     Status: Abnormal   Collection Time: 03/01/23  4:30 AM  Result Value Ref Range   Sodium 127 (L) 135 - 145 mmol/L   Potassium 3.9 3.5 - 5.1 mmol/L   Chloride 82 (L) 98 - 111 mmol/L   CO2 36 (H) 22 - 32 mmol/L   Glucose, Bld 171 (H) 70 - 99 mg/dL    Comment: Glucose reference range applies only  to samples taken after fasting for at least 8 hours.   BUN 23 8 - 23 mg/dL   Creatinine, Ser 1.01 (H) 0.44 - 1.00 mg/dL   Calcium 8.3 (L) 8.9 - 10.3 mg/dL   GFR, Estimated >60 >60 mL/min    Comment: (NOTE) Calculated using the CKD-EPI Creatinine Equation (2021)    Anion gap 9 5 - 15    Comment: Performed at [facility] [address] [place], [place] [address]  CBC     Status:  Abnormal   Collection Time: 03/01/23  4:30 AM  Result Value Ref Range   WBC 9.0 4.0 - 10.5 K/uL   RBC 3.57 (L) 3.87 - 5.11 MIL/uL   Hemoglobin 8.0 (L) 12.0 - 15.0 g/dL    Comment: Reticulocyte Hemoglobin testing may be clinically indicated, consider ordering this additional test [id number]    HCT 27.1 (L) 36.0 - 46.0 %   MCV 75.9 (L) 80.0 - 100.0 fL   MCH 22.4 (L) 26.0 - 34.0 pg   MCHC 29.5 (L) 30.0 - 36.0 g/dL   RDW 22.5 (H) 11.5 - 15.5 %   Platelets 247 150 - 400 K/uL   nRBC 1.7 (H) 0.0 - 0.2 %    Comment: Performed at [facility], [place] [address] [place], [place] [address]  Cooxemetry Panel (carboxy, met, total hgb, O2 sat)     Status: Abnormal   Collection Time: 03/01/23  4:33 AM  Result Value Ref Range   Total hemoglobin 8.2 (L) 12.0 - 16.0 g/dL   O2 Saturation 67 %   Carboxyhemoglobin 1.8 (H) 0.5 - 1.5 %   Methemoglobin <0.7 0.0 - 1.5 %    Comment: Performed at [facility] [address] [place], [place] [address]  Glucose, capillary     Status: Abnormal   Collection Time: 03/01/23  7:57 AM  Result Value Ref Range   Glucose-Capillary 152 (H) 70 - 99 mg/dL    Comment: Glucose reference range applies only to samples taken after fasting for at least 8 hours.  Basic metabolic panel     Status: Abnormal   Collection Time: 03/01/23  2:32 PM  Result Value Ref Range   Sodium 128 (L) 135 - 145 mmol/L   Potassium 3.8 3.5 - 5.1 mmol/L   Chloride 80 (L) 98 - 111 mmol/L   CO2 38 (H) 22 - 32 mmol/L   Glucose, Bld 146 (H) 70 - 99 mg/dL    Comment: Glucose reference range applies only to samples taken after fasting for at least 8 hours.   BUN 19 8 - 23 mg/dL   Creatinine, Ser 1.00 0.44 - 1.00 mg/dL   Calcium 8.4 (L) 8.9 - 10.3 mg/dL   GFR, Estimated >60 >60 mL/min    Comment: (NOTE) Calculated using the CKD-EPI Creatinine Equation (2021)    Anion gap 10 5 - 15    Comment: Performed at [facility] [address] [place], [place] [address]  Magnesium      Status: None   Collection Time: 03/01/23  2:32 PM  Result Value Ref Range   Magnesium 1.9 1.7 - 2.4 mg/dL    Comment: Performed at [facility], [place] [address] [place], [place] [address]  Glucose, capillary     Status: Abnormal   Collection Time: 03/01/23  3:52 PM  Result Value Ref Range   Glucose-Capillary 137 (H) 70 - 99 mg/dL    Comment: Glucose reference range applies only  to samples taken after fasting for at least 8 hours.    Current Facility-Administered Medications  Medication Dose Route Frequency Provider Last Rate Last Admin   0.9 %  sodium chloride infusion  250 mL Intravenous PRN Buford Dresser, MD 10 mL/hr at 02/28/23 0500 250 mL at 02/28/23 0500   0.9 %  sodium chloride infusion  250 mL Intravenous Continuous Buford Dresser, MD 10 mL/hr at 03/01/23 1000 Infusion Verify at 03/01/23 1000   0.9 %  sodium chloride infusion  250 mL Intravenous PRN Buford Dresser, MD       acetaminophen (TYLENOL) tablet 650 mg  650 mg Oral Q4H PRN Buford Dresser, MD       amiodarone (NEXTERONE PREMIX) 360-4.14 MG/200ML-% (1.8 mg/mL) IV infusion  60 mg/hr Intravenous Continuous Clegg, Amy D, NP 33.3 mL/hr at 03/01/23 1600 60 mg/hr at 03/01/23 1600   apixaban (ELIQUIS) tablet 5 mg  5 mg Oral BID Buford Dresser, MD   5 mg at 03/01/23 1255   Chlorhexidine Gluconate Cloth 2 % PADS 6 each  6 each Topical Daily Buford Dresser, MD   6 each at 03/01/23 0900   insulin aspart (novoLOG) injection 0-15 Units  0-15 Units Subcutaneous TID WC Buford Dresser, MD   2 Units at 03/01/23 1600   insulin aspart (novoLOG) injection 0-5 Units  0-5 Units Subcutaneous QHS Buford Dresser, MD       LORazepam (ATIVAN) injection 0.5 mg  0.5 mg Intravenous Q12H PRN Buford Dresser, MD   0.5 mg at 02/26/23 [id number]   magnesium sulfate IVPB 2 g 50 mL  2 g Intravenous Once Clegg, Amy D, NP 50 mL/hr at 03/01/23 1603 2 g at 03/01/23 1603   midodrine (PROAMATINE)  tablet 5 mg  5 mg Oral TID WC Buford Dresser, MD   5 mg at 03/01/23 1602   norepinephrine (LEVOPHED) '4mg'$  in 263m (0.016 mg/mL) premix infusion  0-10 mcg/min Intravenous Titrated CBuford Dresser MD 11.25 mL/hr at 03/01/23 1600 3 mcg/min at 03/01/23 1600   ondansetron (ZOFRAN) injection 4 mg  4 mg Intravenous Q6H PRN CBuford Dresser MD       Oral care mouth rinse  15 mL Mouth Rinse 4 times per day CBuford Dresser MD   15 mL at 03/01/23 1605   Oral care mouth rinse  15 mL Mouth Rinse PRN CBuford Dresser MD       polyethylene glycol (MIRALAX / GLYCOLAX) packet 17 g  17 g Oral Daily CBuford Dresser MD   17 g at 03/01/23 1256   rosuvastatin (CRESTOR) tablet 5 mg  5 mg Oral Daily CBuford Dresser MD   5 mg at 03/01/23 1254   sodium chloride flush (NS) 0.9 % injection 10-40 mL  10-40 mL Intracatheter Q12H CBuford Dresser MD   10 mL at 03/01/23 0903   sodium chloride flush (NS) 0.9 % injection 10-40 mL  10-40 mL Intracatheter PRN CBuford Dresser MD       sodium chloride flush (NS) 0.9 % injection 3 mL  3 mL Intravenous Q12H CBuford Dresser MD   3 mL at 02/28/23 2112   sodium chloride flush (NS) 0.9 % injection 3 mL  3 mL Intravenous PRN CBuford Dresser MD       sodium chloride flush (NS) 0.9 % injection 3 mL  3 mL Intravenous Q12H CBuford Dresser MD   3 mL at 02/28/23 2112   sodium chloride flush (NS) 0.9 % injection 3 mL  3 mL Intravenous Q12H CBuford Dresser MD   3  mL at 02/28/23 2112   sodium chloride flush (NS) 0.9 % injection 3 mL  3 mL Intravenous PRN Buford Dresser, MD       spironolactone (ALDACTONE) tablet 50 mg  50 mg Oral Daily Buford Dresser, MD   50 mg at 03/01/23 1254   traZODone (DESYREL) tablet 50 mg  50 mg Oral QHS PRN Buford Dresser, MD   50 mg at 02/26/23 0013    Musculoskeletal: Strength & Muscle Tone: decreased Gait & Station: unable to stand Patient leans:  N/A            Psychiatric Specialty Exam:  Presentation  General Appearance:  Appropriate for Environment; Fairly Groomed  Eye Contact: Fair  Speech: Clear and Coherent; Normal Rate  Speech Volume: Normal  Handedness: Right   Mood and Affect  Mood: Depressed; Irritable  Affect: Congruent; Constricted   Thought Process  Thought Processes: Coherent; Goal Directed; Linear  Descriptions of Associations:Intact  Orientation:Full (Time, Place and Person)  Thought Content:Logical  History of Schizophrenia/Schizoaffective disorder:No data recorded Duration of Psychotic Symptoms:No data recorded Hallucinations:Hallucinations: None  Ideas of Reference:None  Suicidal Thoughts:Suicidal Thoughts: Yes, Passive SI Passive Intent and/or Plan: Without Intent; Without Plan; Without Means to Carry Out; Without Access to Means  Homicidal Thoughts:Homicidal Thoughts: No   Sensorium  Memory: Immediate Good; Recent Good; Remote Good  Judgment: Fair  Insight: Fair   Materials engineer: Fair  Attention Span: Fair  Recall: [employer] of Knowledge: Fair  Language: Fair   Psychomotor Activity  Psychomotor Activity: Psychomotor Activity: Decreased   Assets  Assets: Armed forces logistics/support/administrative officer; Social Support   Sleep  Sleep: Sleep: Fair   Physical Exam: Physical Exam ROS Blood pressure (!) 87/68, pulse 63, temperature 98.1 F (36.7 C), temperature source Oral, resp. rate 17, height '5\' 7"'$  (1.702 m), weight 71.3 kg, SpO2 100 %. Body mass index is 24.62 kg/m.  Treatment Plan Summary: Pt's depression seems to be situational due to being in the hospital. Pt denies any psych history.  Pt does not feel she needs an antidepressant or psychotherapy at this time. Pt contracts for safety in the hospital. Pt wants to live for her family.  Disposition: No evidence of imminent risk to self or others at present.   Patient does not meet  criteria for psychiatric inpatient admission. Supportive therapy provided about ongoing stressors.  Dereck Leep, MD 03/01/2023 4:12 PM

## 2023-03-01 NOTE — CV Procedure (Signed)
    TRANSESOPHAGEAL ECHOCARDIOGRAM   NAME:  Stacy Jenkins   MRN: [id number] DOB:  [birth year]   ADMIT DATE: 02/22/2023  INDICATIONS: Mitral regurgitation  PROCEDURE:   Informed consent was obtained prior to the procedure. The risks, benefits and alternatives for the procedure were discussed and the patient comprehended these risks.  Risks include, but are not limited to, cough, sore throat, vomiting, nausea, somnolence, esophageal and stomach trauma or perforation, bleeding, low blood pressure, aspiration, pneumonia, infection, trauma to the teeth and death.    Procedural time out performed.   Patient received monitored anesthesia care under the supervision of Dr. Ambrose Pancoast. Patient received a total of 146 mg propofol during the procedure. She also received 8 mg etomidate, 390 mcg norepinephrine, 320 mcg phenylephrine  The transesophageal probe was inserted in the esophagus and stomach without difficulty and multiple views were obtained.    COMPLICATIONS:    There were no immediate complications.  FINDINGS:  LEFT VENTRICLE: EF = 30-35% with global hypokinesis  RIGHT VENTRICLE: Moderately enlarged, moderately reduced function  LEFT ATRIUM: No thrombus/mass. Severely dilated  LEFT ATRIAL APPENDAGE: No thrombus/mass.   RIGHT ATRIUM: No thrombus/mass. Severely dilated  AORTIC VALVE:  S/P biosprosthetic valve replacement. No regurgitation. No vegetation.  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  TRICUSPID VALVE: Normal structure. Severe regurgitation. No vegetation.  PULMONIC VALVE: Grossly normal structure. Trivial regurgitation. No apparent vegetation.  INTERATRIAL SEPTUM: No PFO or ASD seen by color Doppler.  PERICARDIUM: No effusion noted.  DESCENDING AORTA: No significant plaque seen   CONCLUSION: Severe functional MR with noncoaptation of the mitral valve leaflets.  No clear flail or prolapse. Severe TR, +PFO.  Moderately reduced biventricular function   Buford Dresser, MD, PhD, [facility] at [facility] at [facility] [address], [place], [place] [address] [phone]   12:08 PM

## 2023-03-01 NOTE — Progress Notes (Signed)
Advanced Heart Failure Rounding Note  PCP-Cardiologist: [name], MD   Subjective:    3/4 S/P RHC RA 15, PA 60/31 (40)PCWP 30>50, Fick CO/CI 5.3 CI 2.8 . Started on Norepi 5 mcg and diuresed with IV lasix.  3/5 Diuresed with IV lasix + Diuril. Swallow study with recommendations for mildly thick liquids.  Back in SR.  3/6 K 2. Diuretics held. Palliative Care    Currently on Norepi 10 mcg+ amio 60 mg  per hour.    Feels ok. Denies SOB    Objective:   Weight Range: 71.3 kg Body mass index is 24.62 kg/m.   Vital Signs:   Temp:  [97.9 F (36.6 C)-98.5 F (36.9 C)] 98.5 F (36.9 C) (03/07 0358) Pulse Rate:  [64-84] 66 (03/07 0700) Resp:  [12-29] 16 (03/07 0700) BP: (85-106)/(45-75) 96/64 (03/07 0700) SpO2:  [89 %-100 %] 98 % (03/07 0700) FiO2 (%):  [30 %] 30 % (03/07 0400) Weight:  [71.3 kg] 71.3 kg (03/07 0500) Last BM Date : 02/23/23  Weight change: Filed Weights   02/27/23 0400 02/28/23 0416 03/01/23 0500  Weight: 73.5 kg 71.5 kg 71.3 kg    Intake/Output:   Intake/Output Summary (Last 24 hours) at 03/01/2023 0722 Last data filed at 03/01/2023 0618 Gross per 24 hour  Intake 2108.77 ml  Output 2125 ml  Net -16.23 ml    CVP 9-10   Physical Exam   General:   No resp difficulty HEENT: normal Neck: supple. JVP 9-10 . Carotids 2+ bilat; no bruits. No lymphadenopathy or thryomegaly appreciated. Cor: PMI nondisplaced. Regular rate & rhythm. No rubs, gallops. LLSB  or murmurs. Lungs: clear Abdomen: soft, nontender, nondistended. No hepatosplenomegaly. No bruits or masses. Good bowel sounds. Extremities: no cyanosis, clubbing, rash, edema. RUE PICC  Neuro: alert & orientedx3, cranial nerves grossly intact. moves all 4 extremities w/o difficulty. Affect pleasant  Telemetry    SR 70-80s   EKG    N/A  Labs    CBC Recent Labs    02/28/23 0245 02/28/23 0412 03/01/23 0430  WBC 7.7  --  9.0  HGB 8.3* 9.9* 8.0*  HCT 27.1* 29.0* 27.1*  MCV 73.6*   --  75.9*  PLT 230  --  [id number]   Basic Metabolic Panel Recent Labs    02/28/23 0423 02/28/23 2105 03/01/23 0430  NA 128* 127* 127*  K 2.4* 4.2 3.9  CL 77* 82* 82*  CO2 37* 33* 36*  GLUCOSE 115* 139* 171*  BUN 24* 28* 23  CREATININE 1.07* 1.18* 1.01*  CALCIUM 8.4* 8.7* 8.3*  MG 1.9  --  2.1   Liver Function Tests Recent Labs    02/26/23 1140  AST 223*  ALT 200*  ALKPHOS 193*  BILITOT 1.9*  PROT 6.3*  ALBUMIN 3.5   No results for input(s): "LIPASE", "AMYLASE" in the last 72 hours. Cardiac Enzymes No results for input(s): "CKTOTAL", "CKMB", "CKMBINDEX", "TROPONINI" in the last 72 hours.  BNP: BNP (last 3 results) Recent Labs    02/22/23 1534  BNP 876.3*    ProBNP (last 3 results) Recent Labs    02/21/23 1203  PROBNP 4,604*     D-Dimer No results for input(s): "DDIMER" in the last 72 hours. Hemoglobin A1C No results for input(s): "HGBA1C" in the last 72 hours. Fasting Lipid Panel No results for input(s): "CHOL", "HDL", "LDLCALC", "TRIG", "CHOLHDL", "LDLDIRECT" in the last 72 hours. Thyroid Function Tests No results for input(s): "TSH", "T4TOTAL", "T3FREE", "THYROIDAB" in the last  72 hours.  Invalid input(s): "FREET3"  Other results:   Imaging    No results found.   Medications:     Scheduled Medications:  apixaban  5 mg Oral BID   Chlorhexidine Gluconate Cloth  6 each Topical Daily   furosemide  80 mg Intravenous BID   insulin aspart  0-15 Units Subcutaneous TID WC   insulin aspart  0-5 Units Subcutaneous QHS   mouth rinse  15 mL Mouth Rinse 4 times per day   polyethylene glycol  17 g Oral Daily   potassium chloride  40 mEq Oral BID   rosuvastatin  5 mg Oral Daily   sodium chloride flush  10-40 mL Intracatheter Q12H   sodium chloride flush  3 mL Intravenous Q12H   sodium chloride flush  3 mL Intravenous Q12H   sodium chloride flush  3 mL Intravenous Q12H   spironolactone  50 mg Oral Daily    Infusions:  sodium chloride 250 mL  (02/28/23 0500)   sodium chloride 10 mL/hr at 03/01/23 0600   sodium chloride     amiodarone 60 mg/hr (03/01/23 0600)   norepinephrine (LEVOPHED) Adult infusion 10 mcg/min (03/01/23 0617)    PRN Medications: sodium chloride, sodium chloride, acetaminophen, LORazepam, ondansetron (ZOFRAN) IV, mouth rinse, sodium chloride flush, sodium chloride flush, sodium chloride flush, traZODone    Patient Profile   Stacy Jenkins is a 62 year old with a history of CVA, hypertension, hyperlipidemia, minimal CAD, patent foramen ovale, chronic systolic heart failure, 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    Admitted 02/22/2023 for the evaluation of dyspnea.   Assessment/Plan     1.  Shock --> Suspect Cardiogenic -Most recent ECHO EF 45-50% at [facility]. Will need to repeat. Concerned EF will be worse due to ongoing A fib RVR.  Persistent hypotension with elevated lactic acid. CO2 27 SCAI C. Advanced Heart Failure Team Consulted  Lactic acid clearing 3.4>1.4  RHC elevated filling pressures CO 5.3 CI 2.8 t - Remains on Norepi 10-  mcg. CO-OX 67%  - CVP 9-10. Give dose of IV lasix.    2. A fib RVR  - Back in SR.  Cut back amio to 30 mg per hour  - ON eliquis  5 mg twice a day.    3. Acute Hypoxic Respiratory Failure ,  OSA- on Bipap  - Sats stable HFNC 6 liters.  Wean as tolerate.  - CCM following.    4. MR  -TEE today - NPO.     5.  Anemia  -Recent GI bleed 01/2023  -2024 EGD/Colonoscopy with diverticulosis.  -Hgb trending down ? 9.9>8  Possible lab error - No obvious source.  -Check FOBT.  - Follow daily CBCs.    6. CAD 2019 Non obstructive CAD-->10% left main, <25% mid LAD, <25% RCA lesion.  - No chest pain.    7. H/O Bioprosthetic AVR 2019    8. DMII  -Hgb A1C 8 -Continue SSI    9. H/O CVA   10. Hypokalemia K repleted.   11. NSVT Multiple runs of NSVT over night.  -Cut back amio to 30 mg per  hour.  K /Mag stable. .   12. [place] She told staff and me that she wants to die.  Palliative Care appreciated. Continue full code. .   13. Depression Psychiatry consulted.   TEE later today. NPO.    Length of Stay: [place], NP  03/01/2023,  7:22 AM  Advanced Heart Failure Team Pager [phone] (M-F; 7a - 5p)  Please contact [facility] for night-coverage after hours (5p -7a ) and weekends on [email or web address]

## 2023-03-01 NOTE — Anesthesia Postprocedure Evaluation (Signed)
Anesthesia Post Note  Patient: Stacy Jenkins  Procedure(s) Performed: TRANSESOPHAGEAL ECHOCARDIOGRAM (TEE)     Patient location during evaluation: PACU Anesthesia Type: MAC Level of consciousness: awake and alert Pain management: pain level controlled Vital Signs Assessment: post-procedure vital signs reviewed and stable Respiratory status: spontaneous breathing, nonlabored ventilation, respiratory function stable and patient connected to nasal cannula oxygen Cardiovascular status: stable and blood pressure returned to baseline Postop Assessment: no apparent nausea or vomiting Anesthetic complications: no   No notable events documented.  Last Vitals:  Vitals:   03/01/23 1225 03/01/23 1227  BP: 104/65 104/65  Pulse: 70 69  Resp: (!) 22 18  Temp:  (!) 36 C  SpO2: 100% 100%    Last Pain:  Vitals:   03/01/23 1227  TempSrc: Temporal  PainSc: 0-No pain                 Katanya Schlie

## 2023-03-01 NOTE — Transfer of Care (Signed)
Immediate Anesthesia Transfer of Care Note  Patient: Stacy Jenkins  Procedure(s) Performed: TRANSESOPHAGEAL ECHOCARDIOGRAM (TEE)  Patient Location: Endoscopy Unit  Anesthesia Type:MAC  Level of Consciousness: drowsy and patient cooperative  Airway & Oxygen Therapy: Patient Spontanous Breathing and Patient connected to nasal cannula oxygen  Post-op Assessment: Report given to RN, Post -op Vital signs reviewed and stable, and Patient moving all extremities X 4  Post vital signs: Reviewed and stable  Last Vitals:  Vitals Value Taken Time  BP 101/65 03/01/23 1212  Temp    Pulse 69 03/01/23 1213  Resp 15 03/01/23 1213  SpO2 98 % 03/01/23 1213  Vitals shown include unvalidated device data.  Last Pain:  Vitals:   03/01/23 1033  TempSrc:   PainSc: 0-No pain      Patients Stated Pain Goal: 0 ([id number])  Complications: No notable events documented.

## 2023-03-01 NOTE — Progress Notes (Signed)
   Currently on Norepi 10 mcg + Amio 30 mg  per hour.   1415 Long run of VT. Spontaneously broke. SR with frequent PVCs.   BP 113/ 78 Alert and oriented. CVP 8-9.   Increase amio to 60 mg per hour. Check BMET and Mag.   Discussed  with Dr Aundra Dubin.   Trevar Boehringer NP-C  2:42 PM

## 2023-03-01 NOTE — Anesthesia Preprocedure Evaluation (Addendum)
Anesthesia Evaluation  Patient identified by MRN, date of birth, ID band Patient awake    Reviewed: Allergy & Precautions, H&P , NPO status , Patient's Chart, lab work & pertinent test results  Airway Mallampati: II  TM Distance: >3 FB Neck ROM: Full    Dental no notable dental hx. (+) Teeth Intact, Dental Advisory Given   Pulmonary neg pulmonary ROS   Pulmonary exam normal breath sounds clear to auscultation       Cardiovascular Exercise Tolerance: Good hypertension, Pt. on medications negative cardio ROS Normal cardiovascular exam+ Valvular Problems/Murmurs AI and AS  Rhythm:Regular Rate:Normal  R HEART CATH #?@$  IMPRESSION: 1. Severely elevated pre and post capillary filling pressures.  2. Mildly reduced cardiac output by thermodilution; preserved by Fick.  3. Mildly elevated PVR.  4. Large V waves in both R-PA & L-PA consistent with underlying severe mitral regurgitation      Neuro/Psych  Neuromuscular disease CVA, Residual Symptoms negative neurological ROS  negative psych ROS   GI/Hepatic negative GI ROS, Neg liver ROS, hiatal hernia,,,  Endo/Other  negative endocrine ROS    Renal/GU negative Renal ROS  negative genitourinary   Musculoskeletal negative musculoskeletal ROS (+)    Abdominal   Peds negative pediatric ROS (+)  Hematology negative hematology ROS (+) Blood dyscrasia, anemia   Anesthesia Other Findings   Reproductive/Obstetrics negative OB ROS                             Anesthesia Physical Anesthesia Plan  ASA: 4  Anesthesia Plan: MAC   Post-op Pain Management: Minimal or no pain anticipated   Induction: Intravenous  PONV Risk Score and Plan: 2 and Propofol infusion  Airway Management Planned: Mask, Natural Airway and Simple Face Mask  Additional Equipment: None  Intra-op Plan:   Post-operative Plan:   Informed Consent: I have reviewed the patients  History and Physical, chart, labs and discussed the procedure including the risks, benefits and alternatives for the proposed anesthesia with the patient or authorized representative who has indicated his/her understanding and acceptance.       Plan Discussed with: Anesthesiologist and CRNA  Anesthesia Plan Comments:         Anesthesia Quick Evaluation

## 2023-03-01 NOTE — Progress Notes (Signed)
Physical Therapy Treatment Patient Details Name: Stacy Jenkins MRN: [id number] DOB: 07-25-1961 Today's Date: 03/01/2023   History of Present Illness 62 y/o female presented 2/29 for elective TEE. Pt with SOB and tachycardia, TEE cancelled due to A-fib with RVR and hypoxia. Admitted with acute respiratory failure secondary to volume overload, heart failure exacerbation and cardiogenic shock. PMHx: aortic aneurysm repair, CVA, AVR, HFrEF, A-fib, CHF, HTN, DM.    PT Comments    Pt with flat affect but willing to mobilize. Pt with improving strength for power up but remains limited by cognition and pulmonary demand for supplemental oxygen. Pt educated for transfers, gait and safety. Pt required total assist for pericare and linen change.  Supine 99/65 (76) HR 76 Sitting 91/65 (74) HR 77 Standing 89/66 (74) SpO2 90-92% on 6L with gait 98% at rest    Recommendations for follow up therapy are one component of a multi-disciplinary discharge planning process, led by the attending physician.  Recommendations may be updated based on patient status, additional functional criteria and insurance authorization.  Follow Up Recommendations  Home health PT     Assistance Recommended at Discharge Frequent or constant Supervision/Assistance  Patient can return home with the following A little help with walking and/or transfers;A little help with bathing/dressing/bathroom;Help with stairs or ramp for entrance;Assist for transportation;Assistance with cooking/housework   Equipment Recommendations  Rolling walker (2 wheels);BSC/3in1    Recommendations for Other Services       Precautions / Restrictions Precautions Precautions: Fall;Other (comment) Precaution Comments: watch O2, incontinent     Mobility  Bed Mobility Overal bed mobility: Needs Assistance Bed Mobility: Supine to Sit, Sit to Supine     Supine to sit: Min guard Sit to supine: Min guard   General bed mobility comments: HOb 20  degrees with increased time and cues. Return to bed for TEE    Transfers Overall transfer level: Needs assistance   Transfers: Sit to/from Stand Sit to Stand: Min guard           General transfer comment: cues for hand placement with assist for lines and tactile cues to initiate from bed and toilet    Ambulation/Gait Ambulation/Gait assistance: Min assist Gait Distance (Feet): 150 Feet Assistive device: Rolling walker (2 wheels) Gait Pattern/deviations: Step-through pattern, Decreased stride length, Trunk flexed   Gait velocity interpretation: 1.31 - 2.62 ft/sec, indicative of limited community ambulator   General Gait Details: cues for posture, proximity to RW, breathing technique and direction. Pt on 6L with SPO2 90-92% with gait. additional 70' after toileting   Stairs             Wheelchair Mobility    Modified Rankin (Stroke Patients Only)       Balance Overall balance assessment: Needs assistance Sitting-balance support: No upper extremity supported, Feet supported Sitting balance-Leahy Scale: Fair     Standing balance support: During functional activity, Bilateral upper extremity supported, Reliant on assistive device for balance Standing balance-Leahy Scale: Poor Standing balance comment: bil UE on RW in standing                            Cognition Arousal/Alertness: Awake/alert Behavior During Therapy: Flat affect Overall Cognitive Status: Impaired/Different from baseline Area of Impairment: Memory, Following commands, Safety/judgement, Awareness, Problem solving                     Memory: Decreased short-term memory Following Commands: Follows one step commands with  increased time, Follows one step commands inconsistently Safety/Judgement: Decreased awareness of safety, Decreased awareness of deficits   Problem Solving: Slow processing, Decreased initiation, Requires verbal cues General Comments: flat affect, increased  time, repeatedly asking how long it will be that she has a purewick        Exercises      General Comments        Pertinent Vitals/Pain Pain Assessment Pain Assessment: No/denies pain    Home Living                          Prior Function            PT Goals (current goals can now be found in the care plan section) Progress towards PT goals: Progressing toward goals    Frequency    Min 3X/week      PT Plan Current plan remains appropriate    Co-evaluation              AM-PAC PT "6 Clicks" Mobility   Outcome Measure  Help needed turning from your back to your side while in a flat bed without using bedrails?: A Little Help needed moving from lying on your back to sitting on the side of a flat bed without using bedrails?: A Little Help needed moving to and from a bed to a chair (including a wheelchair)?: A Little Help needed standing up from a chair using your arms (e.g., wheelchair or bedside chair)?: A Little Help needed to walk in hospital room?: A Little Help needed climbing 3-5 steps with a railing? : Total 6 Click Score: 16    End of Session Equipment Utilized During Treatment: Gait belt;Oxygen Activity Tolerance: Patient tolerated treatment well Patient left: in bed;with call bell/phone within reach;with nursing/sitter in room Nurse Communication: Mobility status PT Visit Diagnosis: Difficulty in walking, not elsewhere classified (R26.2);Muscle weakness (generalized) (M62.81);Unsteadiness on feet (R26.81)     Time: [id number] PT Time Calculation (min) (ACUTE ONLY): 28 min  Charges:  $Gait Training: 8-22 mins $Therapeutic Activity: 8-22 mins                     Bayard Males, PT Acute Rehabilitation Services Office: [place] 03/01/2023, 1:55 PM

## 2023-03-01 NOTE — Progress Notes (Deleted)
Orthopedic Tech Progress Note Patient Details:  Stacy Jenkins 07/21/1961 [id number]  Ortho Devices Type of Ortho Device: Ulna gutter splint Ortho Device/Splint Location: BLE Ortho Device/Splint Interventions: Ordered, Application   Post Interventions Patient Tolerated: Well Instructions Provided: Care of device  Newt Levingston A [name] 03/01/2023, 4:14 PM

## 2023-03-01 NOTE — Interval H&P Note (Signed)
History and Physical Interval Note:  03/01/2023 10:42 AM  Stacy Jenkins  has presented today for surgery, with the diagnosis of AORTIC STENOSIS.  The various methods of treatment have been discussed with the patient and family. After consideration of risks, benefits and other options for treatment, the patient has consented to  Procedure(s): TRANSESOPHAGEAL ECHOCARDIOGRAM (TEE) (N/A) as a surgical intervention.  The patient's history has been reviewed, patient examined, no change in status, stable for surgery.  I have reviewed the patient's chart and labs.  Questions were answered to the patient's satisfaction.     Nimah Uphoff Harrell Gave

## 2023-03-02 DIAGNOSIS — I5023 Acute on chronic systolic (congestive) heart failure: Secondary | ICD-10-CM | POA: Diagnosis not present

## 2023-03-02 DIAGNOSIS — R570 Cardiogenic shock: Secondary | ICD-10-CM | POA: Diagnosis not present

## 2023-03-02 LAB — COOXEMETRY PANEL
Carboxyhemoglobin: 1.8 % — ABNORMAL HIGH (ref 0.5–1.5)
Methemoglobin: 0.7 % (ref 0.0–1.5)
O2 Saturation: 62.6 %
Total hemoglobin: 8.7 g/dL — ABNORMAL LOW (ref 12.0–16.0)

## 2023-03-02 LAB — BASIC METABOLIC PANEL
Anion gap: 7 (ref 5–15)
BUN: 23 mg/dL (ref 8–23)
CO2: 37 mmol/L — ABNORMAL HIGH (ref 22–32)
Calcium: 8.3 mg/dL — ABNORMAL LOW (ref 8.9–10.3)
Chloride: 83 mmol/L — ABNORMAL LOW (ref 98–111)
Creatinine, Ser: 1.04 mg/dL — ABNORMAL HIGH (ref 0.44–1.00)
GFR, Estimated: >60 mL/min (ref >60)
Glucose, Bld: 153 mg/dL — ABNORMAL HIGH (ref 70–99)
Potassium: 3.6 mmol/L (ref 3.5–5.1)
Sodium: 127 mmol/L — ABNORMAL LOW (ref 135–145)

## 2023-03-02 LAB — GLUCOSE, CAPILLARY
Glucose-Capillary: 78 mg/dL (ref 70–99)
Glucose-Capillary: 131 mg/dL — ABNORMAL HIGH (ref 70–99)
Glucose-Capillary: 125 mg/dL — ABNORMAL HIGH (ref 70–99)
Glucose-Capillary: 110 mg/dL — ABNORMAL HIGH (ref 70–99)

## 2023-03-02 LAB — CBC
HCT: 29.3 % — ABNORMAL LOW (ref 36.0–46.0)
Hemoglobin: 8.4 g/dL — ABNORMAL LOW (ref 12.0–15.0)
MCH: 22.5 pg — ABNORMAL LOW (ref 26.0–34.0)
MCHC: 28.7 g/dL — ABNORMAL LOW (ref 30.0–36.0)
MCV: 78.3 fL — ABNORMAL LOW (ref 80.0–100.0)
Platelets: 236 10*3/uL (ref 150–400)
RBC: 3.74 MIL/uL — ABNORMAL LOW (ref 3.87–5.11)
RDW: 23.7 % — ABNORMAL HIGH (ref 11.5–15.5)
WBC: 9.3 10*3/uL (ref 4.0–10.5)
nRBC: 0.4 % — ABNORMAL HIGH (ref 0.0–0.2)

## 2023-03-02 LAB — MAGNESIUM: Magnesium: 2.3 mg/dL (ref 1.7–2.4)

## 2023-03-02 MED ORDER — ALTEPLASE 2 MG IJ SOLR
2.0000 mg | Freq: Once | INTRAMUSCULAR | Status: AC
  Administered 2023-03-02: 2 mg
  Filled 2023-03-02: qty 2

## 2023-03-02 MED ORDER — POTASSIUM CHLORIDE CRYS ER 20 MEQ PO TBCR
40.0000 meq | EXTENDED_RELEASE_TABLET | Freq: Once | ORAL | Status: AC
  Administered 2023-03-02: 40 meq via ORAL
  Filled 2023-03-02: qty 2

## 2023-03-02 MED ORDER — MIDODRINE HCL 5 MG PO TABS
10.0000 mg | ORAL_TABLET | Freq: Three times a day (TID) | ORAL | Status: DC
  Administered 2023-03-03 – 2023-03-08 (×16): 10 mg via ORAL
  Filled 2023-03-02 (×16): qty 2

## 2023-03-02 MED ORDER — ACETAZOLAMIDE 250 MG PO TABS
250.0000 mg | ORAL_TABLET | Freq: Once | ORAL | Status: AC
  Administered 2023-03-02: 250 mg via ORAL
  Filled 2023-03-02: qty 1

## 2023-03-02 MED ORDER — FUROSEMIDE 10 MG/ML IJ SOLN
60.0000 mg | Freq: Once | INTRAMUSCULAR | Status: AC
  Administered 2023-03-02: 60 mg via INTRAVENOUS
  Filled 2023-03-02: qty 6

## 2023-03-02 NOTE — Progress Notes (Signed)
Occupational Therapy Treatment Patient Details Name: Stacy Jenkins MRN: [id number] DOB: 1961-01-05 Today's Date: 03/02/2023   History of present illness 62 y/o female presented 2/29 for elective TEE. Pt with SOB and tachycardia, TEE cancelled due to A-fib with RVR and hypoxia. Admitted with acute respiratory failure secondary to volume overload, heart failure exacerbation and cardiogenic shock. PMHx: aortic aneurysm repair, CVA, AVR, HFrEF, A-fib, CHF, HTN, DM.   OT comments  This 62  yo female seen today to focus on ADLs, pt able to doff and don socks with Mod A sitting in recliner, Min guard A sit<.>stand, min guard to ambulate with RW to sink, and minguard to stand and brush teeth. She will continue to benefit from acute OT with follow up HHOT and 24 hour S/A.   Recommendations for follow up therapy are one component of a multi-disciplinary discharge planning process, led by the attending physician.  Recommendations may be updated based on patient status, additional functional criteria and insurance authorization.    Follow Up Recommendations  Home health OT     Assistance Recommended at Discharge Frequent or constant Supervision/Assistance  Patient can return home with the following  A little help with walking and/or transfers;A little help with bathing/dressing/bathroom;Help with stairs or ramp for entrance;Assistance with cooking/housework;Assist for transportation;Direct supervision/assist for financial management;Direct supervision/assist for medications management   Equipment Recommendations  Other (comment) (RW)       Precautions / Restrictions Precautions Precautions: Fall Precaution Comments: watch O2 Restrictions Weight Bearing Restrictions: No       Mobility Bed Mobility               General bed mobility comments: up in recliner upon arrival    Transfers Overall transfer level: Needs assistance Equipment used: Rolling walker (2 wheels) Transfers: Sit  to/from Stand Sit to Stand: Min guard           General transfer comment: VCs for safe hand placement     Balance Overall balance assessment: Needs assistance Sitting-balance support: No upper extremity supported, Feet supported Sitting balance-Leahy Scale: Fair     Standing balance support: No upper extremity supported, During functional activity Standing balance-Leahy Scale: Fair Standing balance comment: standing at sink to brush teeth                           ADL either performed or assessed with clinical judgement   ADL Overall ADL's : Needs assistance/impaired     Grooming: Oral care;Min guard;Standing Grooming Details (indicate cue type and reason): at sink             Lower Body Dressing: Moderate assistance Lower Body Dressing Details (indicate cue type and reason): min guard A sit<>stand Toilet Transfer: Minimal assistance;Ambulation;Rolling walker (2 wheels) Toilet Transfer Details (indicate cue type and reason): simulated recliner>sink to brush teeth>recliner                Extremity/Trunk Assessment Upper Extremity Assessment Upper Extremity Assessment: Overall WFL for tasks assessed            Vision Baseline Vision/History: 1 Wears glasses Patient Visual Report: No change from baseline            Cognition Arousal/Alertness: Awake/alert Behavior During Therapy:  (snarly with husband today) Overall Cognitive Status: Impaired/Different from baseline Area of Impairment: Memory, Following commands, Safety/judgement, Awareness, Problem solving  Memory: Decreased short-term memory Following Commands: Follows one step commands inconsistently Safety/Judgement: Decreased awareness of safety, Decreased awareness of deficits Awareness: Intellectual Problem Solving: Slow processing                General Comments VSS, left on RA after speaking with RN with pt sats 93-94%    Pertinent Vitals/  Pain       Pain Assessment Pain Assessment: No/denies pain         Frequency  Min 2X/week        Progress Toward Goals  OT Goals(current goals can now be found in the care plan section)  Progress towards OT goals: Progressing toward goals  Acute Rehab OT Goals Patient Stated Goal: to go home Sunday OT Goal Formulation: With patient Time For Goal Achievement: 03/14/23 Potential to Achieve Goals: Good  Plan Discharge plan remains appropriate       AM-PAC OT "6 Clicks" Daily Activity     Outcome Measure   Help from another person eating meals?: None Help from another person taking care of personal grooming?: A Little Help from another person toileting, which includes using toliet, bedpan, or urinal?: A Little Help from another person bathing (including washing, rinsing, drying)?: A Little Help from another person to put on and taking off regular upper body clothing?: A Little Help from another person to put on and taking off regular lower body clothing?: A Little 6 Click Score: 19    End of Session Equipment Utilized During Treatment: Rolling walker (2 wheels)  OT Visit Diagnosis: Unsteadiness on feet (R26.81);Other abnormalities of gait and mobility (R26.89);Muscle weakness (generalized) (M62.81)   Activity Tolerance Patient tolerated treatment well   Patient Left in chair;with call bell/phone within reach;with chair alarm set;with family/visitor present   Nurse Communication  (made aware of sats on RA 93-94% to ask if could leave off for now and RN said yes)        Time: 1029-1050 OT Time Calculation (min): 21 min  Charges: OT General Charges $OT Visit: 1 Visit OT Treatments $Self Care/Home Management : 8-22 mins  [place] Office [phone]    Almon Register 03/02/2023, 11:39 AM

## 2023-03-02 NOTE — Progress Notes (Signed)
Physical Therapy Treatment Patient Details Name: Stacy Jenkins MRN: [id number] DOB: 19-Mar-1961 Today's Date: 03/02/2023   History of Present Illness 62 y/o female presented 2/29 for elective TEE. Pt with SOB and tachycardia, TEE cancelled due to A-fib with RVR and hypoxia. Admitted with acute respiratory failure secondary to volume overload, heart failure exacerbation and cardiogenic shock. PMHx: aortic aneurysm repair, CVA, AVR, HFrEF, A-fib, CHF, HTN, DM.    PT Comments    Pt with flat affect, decreased awareness and processing with improved gait tolerance. Pt consistently making statements of desire to go home and that "this is wasteful helping people like me". Pt educated for HEP, progressive mobility for home and RW use. Will continue to follow with increased mobility recommended.     Recommendations for follow up therapy are one component of a multi-disciplinary discharge planning process, led by the attending physician.  Recommendations may be updated based on patient status, additional functional criteria and insurance authorization.  Follow Up Recommendations  Home health PT     Assistance Recommended at Discharge Frequent or constant Supervision/Assistance  Patient can return home with the following A little help with walking and/or transfers;A little help with bathing/dressing/bathroom;Help with stairs or ramp for entrance;Assist for transportation;Assistance with cooking/housework   Equipment Recommendations  Rolling walker (2 wheels);BSC/3in1    Recommendations for Other Services       Precautions / Restrictions Precautions Precautions: Fall Precaution Comments: watch O2 Restrictions Weight Bearing Restrictions: No     Mobility  Bed Mobility Overal bed mobility: Needs Assistance Bed Mobility: Supine to Sit     Supine to sit: Min guard     General bed mobility comments: cues for sequence and assist for lines with HOB 10 degrees    Transfers Overall  transfer level: Needs assistance   Transfers: Sit to/from Stand Sit to Stand: Min guard           General transfer comment: cues for hand placement to rise from bed and bSC, decreased control with descent to chair    Ambulation/Gait Ambulation/Gait assistance: Min guard Gait Distance (Feet): 300 Feet Assistive device: Rolling walker (2 wheels) Gait Pattern/deviations: Step-through pattern, Decreased stride length, Trunk flexed   Gait velocity interpretation: 1.31 - 2.62 ft/sec, indicative of limited community ambulator   General Gait Details: cues for posture, proximity to RW, and Pt maintaining SPO2 >92% on 2L throughout   Stairs             Wheelchair Mobility    Modified Rankin (Stroke Patients Only)       Balance Overall balance assessment: Needs assistance   Sitting balance-Leahy Scale: Fair     Standing balance support: Bilateral upper extremity supported, Reliant on assistive device for balance Standing balance-Leahy Scale: Fair Standing balance comment: RW for gait                            Cognition Arousal/Alertness: Awake/alert Behavior During Therapy: Flat affect Overall Cognitive Status: Impaired/Different from baseline Area of Impairment: Orientation, Memory, Safety/judgement, Problem solving                 Orientation Level: Disoriented to, Time   Memory: Decreased short-term memory Following Commands: Follows one step commands inconsistently Safety/Judgement: Decreased awareness of safety, Decreased awareness of deficits   Problem Solving: Slow processing General Comments: flat affect, repeatedly stating "this is a waste of time, you need to help people that need it"  Exercises General Exercises - Lower Extremity Long Arc Quad: AROM, Both, Seated, 20 reps Hip Flexion/Marching: AROM, Both, Seated, 20 reps    General Comments      Pertinent Vitals/Pain Pain Assessment Pain Assessment: No/denies pain     Home Living                          Prior Function            PT Goals (current goals can now be found in the care plan section) Progress towards PT goals: Progressing toward goals    Frequency    Min 3X/week      PT Plan Current plan remains appropriate    Co-evaluation              AM-PAC PT "6 Clicks" Mobility   Outcome Measure  Help needed turning from your back to your side while in a flat bed without using bedrails?: A Little Help needed moving from lying on your back to sitting on the side of a flat bed without using bedrails?: A Little Help needed moving to and from a bed to a chair (including a wheelchair)?: A Little Help needed standing up from a chair using your arms (e.g., wheelchair or bedside chair)?: A Little Help needed to walk in hospital room?: A Little Help needed climbing 3-5 steps with a railing? : A Lot 6 Click Score: 17    End of Session Equipment Utilized During Treatment: Oxygen Activity Tolerance: Patient tolerated treatment well Patient left: in chair;with call bell/phone within reach;with chair alarm set;with nursing/sitter in room Nurse Communication: Mobility status PT Visit Diagnosis: Difficulty in walking, not elsewhere classified (R26.2);Muscle weakness (generalized) (M62.81);Unsteadiness on feet (R26.81)     Time: [id number] PT Time Calculation (min) (ACUTE ONLY): 32 min  Charges:  $Gait Training: 8-22 mins $Therapeutic Activity: 8-22 mins                     Bayard Males, PT Acute Rehabilitation Services Office: [place] 03/02/2023, 12:00 PM

## 2023-03-02 NOTE — TOC Progression Note (Signed)
Transition of Care [facility]) - Progression Note    Patient Details  Name: Stacy Jenkins MRN: [id number] Date of Birth: 1961-12-05  Transition of Care [facility]) CM/SW Contact  Erenest Rasher, RN Phone Number: [phone] 03/02/2023, 2:39 PM  Clinical Narrative:    HH choice offered to pt and husband. Provided medicare.gov list with listing and placed on chart. Agreeable to [facility]. Contacted [facility] rep, Amy with new referral. Sent message to attending for [facility] orders.  Pt has RW, bedside commode and scale at home.   Expected Discharge Plan: [place] Barriers to Discharge: Continued Medical Work up  Expected Discharge Plan and Services   Discharge Planning Services: CM Consult   Living arrangements for the past 2 months: [place]: [place] Date [place]: 03/02/23 Time [place]: [id number] Representative spoke with at [place]: [place] ([place]) Interventions [place]: No Food Insecurity (10/05/2022)  Housing: Low Risk  (10/05/2022)  Transportation Needs: No Transportation Needs (10/05/2022)  Utilities: Not At Risk (10/05/2022)  Tobacco Use: Low Risk  (03/01/2023)    Readmission Risk Interventions     No data to display

## 2023-03-02 NOTE — Progress Notes (Signed)
Advanced Heart Failure Rounding Note  PCP-Cardiologist: [name], MD   Subjective:    3/4 S/P RHC RA 15, PA 60/31 (40)PCWP 30>50, Fick CO/CI 5.3 CI 2.8 . Started on Norepi 5 mcg and diuresed with IV lasix.  3/5 Diuresed with IV lasix + Diuril. Swallow study with recommendations for mildly thick liquids.  Back in SR.  3/6 K 2. Diuretics held. Palliative Care  3/7 Long run of PMVT, spontaneously broke. 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 EF 30-35%, RV mod reduced, severe TR   On NE 4 + amio at 60. Co-ox 62%. Good UOP, 2L out yesterday. CVP 8. Scr stable 1.04.    Resting comfortably in bed.   Objective:   Weight Range: 70.1 kg Body mass index is 24.2 kg/m.   Vital Signs:   Temp:  [96.8 F (36 C)-98.1 F (36.7 C)] 97.8 F (36.6 C) (03/07 2300) Pulse Rate:  [57-80] 60 (03/08 0815) Resp:  [13-26] 14 (03/08 0815) BP: (70-119)/(50-80) 84/55 (03/08 0815) SpO2:  [80 %-100 %] 99 % (03/08 0815) FiO2 (%):  [30 %] 30 % (03/08 0001) Weight:  [70.1 kg] 70.1 kg (03/08 0500) Last BM Date : 02/23/23  Weight change: Filed Weights   02/28/23 0416 03/01/23 0500 03/02/23 0500  Weight: 71.5 kg 71.3 kg 70.1 kg    Intake/Output:   Intake/Output Summary (Last 24 hours) at 03/02/2023 0900 Last data filed at 03/02/2023 0800 Gross per 24 hour  Intake 1374.24 ml  Output 2010 ml  Net -635.76 ml      Physical Exam   CVP 8  General:   fatigued appearing F.  Resting comfortably. No resp difficulty HEENT: normal Neck: supple. JVP 8 cm . Carotids 2+ bilat; no bruits. No lymphadenopathy or thryomegaly appreciated. Cor: PMI nondisplaced. Regular rate & rhythm. 3/6 MR murmur  Lungs: decreased BS at the bases bilaterally  Abdomen: soft, nontender, nondistended. No hepatosplenomegaly. No bruits or masses. Good bowel sounds. Extremities: no cyanosis, clubbing, rash, trace b/l LE edema. RUE PICC   Neuro: alert & orientedx3, cranial nerves grossly intact. moves all 4 extremities w/o difficulty. Affect pleasant  Telemetry   SR 70-80s   EKG    N/A  Labs    CBC Recent Labs    03/01/23 0430 03/02/23 0313  WBC 9.0 9.3  HGB 8.0* 8.4*  HCT 27.1* 29.3*  MCV 75.9* 78.3*  PLT 247 [id number]   Basic Metabolic Panel Recent Labs    03/01/23 1432 03/02/23 0313  NA 128* 127*  K 3.8 3.6  CL 80* 83*  CO2 38* 37*  GLUCOSE 146* 153*  BUN 19 23  CREATININE 1.00 1.04*  CALCIUM 8.4* 8.3*  MG 1.9 2.3   Liver Function Tests No results for input(s): "AST", "ALT", "ALKPHOS", "BILITOT", "PROT", "ALBUMIN" in the last 72 hours.  No results for input(s): "LIPASE", "AMYLASE" in the last 72 hours. Cardiac Enzymes No results for input(s): "CKTOTAL", "CKMB", "CKMBINDEX", "TROPONINI" in the last 72 hours.  BNP: BNP (last 3 results) Recent Labs    02/22/23 1534  BNP 876.3*    ProBNP (last 3 results) Recent Labs    02/21/23 1203  PROBNP 4,604*     D-Dimer No results for input(s): "DDIMER" in the last 72 hours. Hemoglobin A1C No results for input(s): "HGBA1C" in the last 72 hours. Fasting Lipid Panel No results for input(s): "CHOL", "HDL", "LDLCALC", "TRIG", "  CHOLHDL", "LDLDIRECT" in the last 72 hours. Thyroid Function Tests No results for input(s): "TSH", "T4TOTAL", "T3FREE", "THYROIDAB" in the last 72 hours.  Invalid input(s): "FREET3"  Other results:   Imaging    ECHO TEE  Result Date: 03/01/2023    TRANSESOPHOGEAL ECHO REPORT   Patient Name:   Elpidia Zakarian Date of Exam: 03/01/2023 Medical Rec #:  [id number]       Height:       67.0 in Accession #:    [id number]      Weight:       157.2 lb Date of Birth:  07-04-1961       BSA:          1.825 m Patient Age:    2 years        BP:           101/5465 mmHg Patient Gender: F               HR:           51 bpm. Exam Location:  Inpatient Procedure: Transesophageal Echo, Cardiac Doppler and Color Doppler Indications:    I34.0  Nonrheumatic mitral (valve) insufficiency  History:        Patient has prior history of Echocardiogram examinations, most                 recent 02/27/2023.                 Aortic Valve: bioprosthetic valve is present in the aortic                 position.  Sonographer:    Wilkie Aye RVT Referring Phys: [id number] BRIDGETTE CHRISTOPHER PROCEDURE: After discussion of the risks and benefits of a TEE, an informed consent was obtained from the patient. The transesophogeal probe was passed without difficulty through the esophogus of the patient. Imaged were obtained with the patient in a left lateral decubitus position. Sedation performed by different physician. The patient developed no complications during the procedure.  IMPRESSIONS  1. Left ventricular ejection fraction, by estimation, is 30 to 35%. The left ventricle has moderately decreased function. The left ventricle demonstrates global hypokinesis. The left ventricular internal cavity size was severely dilated.  2. Right ventricular systolic function is moderately reduced. The right ventricular size is moderately enlarged.  3. Left atrial size was severely dilated. No left atrial/left atrial appendage thrombus was detected.  4. Right atrial size was severely dilated.  5. No evidence of flail or prolapse. Severe MR with PV flow reversal due to LV dilation/lack of central coaptation (Carpentier class 3b). Central gap between anterior and posterior leaflet measures between 0.5-0.7 cm. The mitral valve is abnormal. Severe mitral valve regurgitation. No evidence of mitral stenosis.  6. Tricuspid valve regurgitation is severe.  7. The aortic valve has been repaired/replaced. Aortic valve regurgitation is not visualized. No aortic stenosis is present. There is a bioprosthetic valve present in the aortic position.  8. Evidence of atrial level shunting detected by color flow Doppler. There is a small patent foramen ovale with predominantly left to right shunting across the  atrial septum. Conclusion(s)/Recommendation(s): Severe MR with noncoaptation of leaflets, severe TR, +PFO, moderate to severe biventricular heart failure. FINDINGS  Left Ventricle: Left ventricular ejection fraction, by estimation, is 30 to 35%. The left ventricle has moderately decreased function. The left ventricle demonstrates global hypokinesis. The left ventricular internal cavity size was severely dilated. Right Ventricle: The right ventricular size is moderately  enlarged. No increase in right ventricular wall thickness. Right ventricular systolic function is moderately reduced. Left Atrium: Left atrial size was severely dilated. No left atrial/left atrial appendage thrombus was detected. Right Atrium: Right atrial size was severely dilated. Pericardium: There is no evidence of pericardial effusion. Mitral Valve: No evidence of flail or prolapse. Severe MR with PV flow reversal due to LV dilation/lack of central coaptation (Carpentier class 3b). Central gap between anterior and posterior leaflet measures between 0.5-0.7 cm. The mitral valve is abnormal. Severe mitral valve regurgitation. No evidence of mitral valve stenosis. Tricuspid Valve: The tricuspid valve is normal in structure. Tricuspid valve regurgitation is severe. No evidence of tricuspid stenosis. Aortic Valve: The aortic valve has been repaired/replaced. Aortic valve regurgitation is not visualized. No aortic stenosis is present. There is a bioprosthetic valve present in the aortic position. Pulmonic Valve: The pulmonic valve was grossly normal. Pulmonic valve regurgitation is trivial. No evidence of pulmonic stenosis. Aorta: The aortic root is normal in size and structure. IAS/Shunts: Evidence of atrial level shunting detected by color flow Doppler. There is no evidence of a patent foramen ovale. A small patent foramen ovale is detected with predominantly left to right shunting across the atrial septum.  TRICUSPID VALVE TR Peak grad:   32.7 mmHg  TR Vmax:        286.00 cm/s Buford Dresser MD Electronically signed by Buford Dresser MD Signature Date/Time: 03/01/2023/4:11:00 PM    Final      Medications:     Scheduled Medications:  apixaban  5 mg Oral BID   Chlorhexidine Gluconate Cloth  6 each Topical Daily   insulin aspart  0-15 Units Subcutaneous TID WC   insulin aspart  0-5 Units Subcutaneous QHS   midodrine  5 mg Oral TID WC   mouth rinse  15 mL Mouth Rinse 4 times per day   polyethylene glycol  17 g Oral Daily   rosuvastatin  5 mg Oral Daily   sodium chloride flush  10-40 mL Intracatheter Q12H   sodium chloride flush  3 mL Intravenous Q12H   sodium chloride flush  3 mL Intravenous Q12H   sodium chloride flush  3 mL Intravenous Q12H   spironolactone  50 mg Oral Daily    Infusions:  sodium chloride 250 mL (02/28/23 0500)   sodium chloride 10 mL/hr at 03/01/23 1000   sodium chloride     amiodarone 60 mg/hr (03/02/23 0800)   norepinephrine (LEVOPHED) Adult infusion 4 mcg/min (03/02/23 0800)    PRN Medications: sodium chloride, sodium chloride, acetaminophen, LORazepam, ondansetron (ZOFRAN) IV, mouth rinse, sodium chloride flush, sodium chloride flush, sodium chloride flush, traZODone    Patient Profile   Mrs Mcnaney is a 62 year old with a history of CVA, hypertension, hyperlipidemia, minimal CAD, patent foramen ovale, chronic systolic heart failure, 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    Admitted 02/22/2023 for the evaluation of dyspnea.   Assessment/Plan     1.  Shock --> Suspect Cardiogenic -Most recent ECHO EF 45-50% at [facility]. Will need to repeat. Concerned EF will be worse due to ongoing A fib RVR.  Persistent hypotension with elevated lactic acid. CO2 27 SCAI C. Advanced Heart Failure Team Consulted  Lactic acid clearing 3.4>1.4  RHC elevated filling pressures CO 5.3 CI 2.8 t - Remains on Norepi 4 mcg. CO-OX  63%  - CVP 8. Give dose of IV lasix today + diamox 250 mg x 1 (CO  37).    2. A fib RVR  - Back in SR. Cont amio  gtt - ON eliquis  5 mg twice a day.    3. Acute Hypoxic Respiratory Failure ,  - improving w/ diuresis  - CCM following.    4. MR  - TEE 3/7 severe functional MR with noncoaptation of the mitral valve leaflets.  No clear flail or prolapse. Severe TR, +PFO. Moderately reduced biventricular function - d/w structural heart team at bedside. Anatomy likely not favorable for clip    5.  Iron Deficiency Anemia  -Recent GI bleed 01/2023  -2024 EGD/Colonoscopy with diverticulosis.  -Hgb 10.5> 9.9>8>8.4   -No obvious source.  -Fe 18, Tsat 4. Treated w/ IV Fe  -Check FOBT.     6. CAD 2019 Non obstructive CAD-->10% left main, <25% mid LAD, <25% RCA lesion.  - No chest pain.    7. H/O Bioprosthetic AVR 2019    8. DMII  -Hgb A1C 8 -Continue SSI    9. H/O CVA   10. Hypokalemia - K repleted.   11. NSVT/ PMVT  - Continue Amio gtt   - Keep K > 4.0 and Mg > 2.0  - EKG today to check QTc    12. [place] She told staff and me that she wants to die.  Palliative Care appreciated. Continue full code.   48. Depression Psychiatry consulted.     Length of Stay: [address] PA-C  03/02/2023, 9:00 AM  Advanced Heart Failure Team Pager [phone] (M-F; 7a - 5p)  Please contact [facility] for night-coverage after hours (5p -7a ) and weekends on [email or web address]

## 2023-03-02 NOTE — Progress Notes (Signed)
Speech Language Pathology Treatment: Dysphagia  Patient Details Name: Stacy Jenkins MRN: [id number] DOB: 01/04/1961 Today's Date: 03/02/2023 Time: [id number] SLP Time Calculation (min) (ACUTE ONLY): 9 min  Assessment / Plan / Recommendation Clinical Impression  Patient seen for f/u diet tolerance assessment. She was alert, self feeding am meal on arrival, independent with use of general safe swallowing precautions. She was unable to recall results of instrumental testing when questioned. SLP provided education on results and recommendations from Stacy Jenkins. She verbalized understanding, affect flat and patient stating "I want to go home." Sublte s/s of aspiration noted with po intake characterized by intermittent wet vocal quality followed by throat clearing. Could be penetration of liquids to the vocal cords however spontaneous throat clearing appearing to clear episodes. Overall, current diet appears to remain appropriate. Note that this SLP did not observe patient with solids today as she had just finished am meal and declined further solid intake. Will continue to f/u. Hopeful for ability to advance over the next several days.    HPI HPI: Stacy Jenkins is a 62 y.o. 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  She was diagnosed with acute respiratory, acute on chronic systolic heart failure and afib with RVR.  Chest xray was showing moderate pulmonary edema with small right pleural effusion.      SLP Plan  Continue with current plan of care;MBS      Recommendations for follow up therapy are one component of a multi-disciplinary discharge planning process, led by the attending physician.  Recommendations may be updated based on patient status, additional  functional criteria and insurance authorization.    Recommendations  Diet recommendations: Regular;Nectar-thick liquid Liquids provided via: Cup;Straw Medication Administration: Whole meds with liquid Supervision: Patient able to self feed Compensations: Slow rate;Small sips/bites Postural Changes and/or Swallow Maneuvers: Seated upright 90 degrees                Oral Care Recommendations: Oral care BID Plan: Continue with current plan of care;MBS          Stacy Rainwater MA, CCC-SLP  Stacy Jenkins Stacy Jenkins  03/02/2023, 9:46 AM

## 2023-03-02 NOTE — Progress Notes (Signed)
Orthopedic Tech Progress Note Patient Details:  Stacy Jenkins [birth year] [id number] Applied Unna boots yesterday (3/7) to patient.  Ortho Devices Type of Ortho Device: Haematologist Ortho Device/Splint Location: BLE Ortho Device/Splint Interventions: Ordered, Application   Post Interventions Patient Tolerated: Well Instructions Provided: Care of device  Stacy Jenkins 03/02/2023, 8:29 AM

## 2023-03-02 NOTE — Plan of Care (Signed)

## 2023-03-03 DIAGNOSIS — R570 Cardiogenic shock: Secondary | ICD-10-CM | POA: Diagnosis not present

## 2023-03-03 DIAGNOSIS — I340 Nonrheumatic mitral (valve) insufficiency: Secondary | ICD-10-CM | POA: Diagnosis not present

## 2023-03-03 DIAGNOSIS — I5023 Acute on chronic systolic (congestive) heart failure: Secondary | ICD-10-CM | POA: Diagnosis not present

## 2023-03-03 LAB — BASIC METABOLIC PANEL
Anion gap: 9 (ref 5–15)
BUN: 20 mg/dL (ref 8–23)
CO2: 29 mmol/L (ref 22–32)
Calcium: 8.6 mg/dL — ABNORMAL LOW (ref 8.9–10.3)
Chloride: 89 mmol/L — ABNORMAL LOW (ref 98–111)
Creatinine, Ser: 1.07 mg/dL — ABNORMAL HIGH (ref 0.44–1.00)
GFR, Estimated: 58 mL/min — ABNORMAL LOW (ref >60)
Glucose, Bld: 136 mg/dL — ABNORMAL HIGH (ref 70–99)
Potassium: 3.5 mmol/L (ref 3.5–5.1)
Sodium: 127 mmol/L — ABNORMAL LOW (ref 135–145)

## 2023-03-03 LAB — CBC
HCT: 30.6 % — ABNORMAL LOW (ref 36.0–46.0)
Hemoglobin: 8.8 g/dL — ABNORMAL LOW (ref 12.0–15.0)
MCH: 22.7 pg — ABNORMAL LOW (ref 26.0–34.0)
MCHC: 28.8 g/dL — ABNORMAL LOW (ref 30.0–36.0)
MCV: 79.1 fL — ABNORMAL LOW (ref 80.0–100.0)
Platelets: 243 10*3/uL (ref 150–400)
RBC: 3.87 MIL/uL (ref 3.87–5.11)
RDW: 24.7 % — ABNORMAL HIGH (ref 11.5–15.5)
WBC: 8.8 10*3/uL (ref 4.0–10.5)
nRBC: 0 % (ref 0.0–0.2)

## 2023-03-03 LAB — COOXEMETRY PANEL
Carboxyhemoglobin: 2.1 % — ABNORMAL HIGH (ref 0.5–1.5)
Methemoglobin: 1 % (ref 0.0–1.5)
O2 Saturation: 67.3 %
Total hemoglobin: 9.3 g/dL — ABNORMAL LOW (ref 12.0–16.0)

## 2023-03-03 LAB — GLUCOSE, CAPILLARY
Glucose-Capillary: 103 mg/dL — ABNORMAL HIGH (ref 70–99)
Glucose-Capillary: 104 mg/dL — ABNORMAL HIGH (ref 70–99)
Glucose-Capillary: 100 mg/dL — ABNORMAL HIGH (ref 70–99)
Glucose-Capillary: 112 mg/dL — ABNORMAL HIGH (ref 70–99)
Glucose-Capillary: 127 mg/dL — ABNORMAL HIGH (ref 70–99)

## 2023-03-03 LAB — MAGNESIUM: Magnesium: 2.3 mg/dL (ref 1.7–2.4)

## 2023-03-03 MED ORDER — AMIODARONE HCL 200 MG PO TABS: 200.0000 mg | ORAL_TABLET | Freq: Every day | ORAL | Status: DC

## 2023-03-03 MED ORDER — ORAL CARE MOUTH RINSE: 15.0000 mL | OROMUCOSAL | Status: DC | PRN

## 2023-03-03 MED ORDER — AMIODARONE HCL 200 MG PO TABS: 400.0000 mg | ORAL_TABLET | Freq: Two times a day (BID) | ORAL | Status: DC

## 2023-03-03 MED ORDER — DIGOXIN 125 MCG PO TABS
0.1250 mg | ORAL_TABLET | Freq: Every day | ORAL | Status: DC
  Administered 2023-03-03 – 2023-03-08 (×6): 0.125 mg via ORAL
  Filled 2023-03-03 (×6): qty 1

## 2023-03-03 MED ORDER — POTASSIUM CHLORIDE CRYS ER 20 MEQ PO TBCR
40.0000 meq | EXTENDED_RELEASE_TABLET | Freq: Once | ORAL | Status: AC
  Administered 2023-03-03: 40 meq via ORAL
  Filled 2023-03-03: qty 2

## 2023-03-03 MED ORDER — FUROSEMIDE 40 MG PO TABS
40.0000 mg | ORAL_TABLET | Freq: Every day | ORAL | Status: DC
  Administered 2023-03-03 – 2023-03-04 (×2): 40 mg via ORAL
  Filled 2023-03-03 (×2): qty 1

## 2023-03-03 MED ORDER — AMIODARONE HCL 200 MG PO TABS
400.0000 mg | ORAL_TABLET | Freq: Two times a day (BID) | ORAL | Status: DC
  Administered 2023-03-03 – 2023-03-08 (×11): 400 mg via ORAL
  Filled 2023-03-03 (×11): qty 2

## 2023-03-03 NOTE — Plan of Care (Signed)

## 2023-03-03 NOTE — Progress Notes (Signed)
RT NOTE: patient resting comfortably on room air this AM with no respiratory distress noted.  Bipap on standby and currently not indicated.  Will continue to monitor.

## 2023-03-03 NOTE — Progress Notes (Signed)
Pt refused BiPAP use for tonight.

## 2023-03-03 NOTE — Progress Notes (Signed)
Cardiology Progress Note  Patient ID: Stacy Jenkins MRN: [id number] DOB: 1961/04/12 Date of Encounter: 03/03/2023  Primary Cardiologist: [name], MD  Subjective   Chief Complaint: SOB  HPI: Good diuresis.  Kidney function stable.  Blood pressure stable.  Weaning off norepinephrine today.  Transition to oral amiodarone. CVP 4.   ROS:  All other ROS reviewed and negative. Pertinent positives noted in the HPI.     Inpatient Medications  Scheduled Meds:  amiodarone  400 mg Oral BID   Followed by   Derrill Memo ON 03/08/2023] amiodarone  200 mg Oral Daily   apixaban  5 mg Oral BID   Chlorhexidine Gluconate Cloth  6 each Topical Daily   digoxin  0.125 mg Oral Daily   insulin aspart  0-15 Units Subcutaneous TID WC   insulin aspart  0-5 Units Subcutaneous QHS   midodrine  10 mg Oral TID WC   mouth rinse  15 mL Mouth Rinse 4 times per day   polyethylene glycol  17 g Oral Daily   potassium chloride  40 mEq Oral Once   rosuvastatin  5 mg Oral Daily   sodium chloride flush  10-40 mL Intracatheter Q12H   sodium chloride flush  3 mL Intravenous Q12H   sodium chloride flush  3 mL Intravenous Q12H   sodium chloride flush  3 mL Intravenous Q12H   spironolactone  50 mg Oral Daily   Continuous Infusions:  sodium chloride 250 mL (02/28/23 0500)   sodium chloride 10 mL/hr at 03/01/23 1000   sodium chloride     norepinephrine (LEVOPHED) Adult infusion 4 mcg/min (03/03/23 0612)   PRN Meds: sodium chloride, sodium chloride, acetaminophen, LORazepam, ondansetron (ZOFRAN) IV, mouth rinse, sodium chloride flush, sodium chloride flush, sodium chloride flush, traZODone   Vital Signs   Vitals:   03/03/23 0400 03/03/23 0500 03/03/23 0600 03/03/23 0700  BP: 108/71 95/60 112/71 113/78  Pulse: (!) 55 (!) 54 (!) 56 63  Resp: 13 20 (!) 21 17  Temp:      TempSrc:      SpO2: 100% 100% 100% 100%  Weight:  70.2 kg    Height:        Intake/Output Summary (Last 24 hours) at 03/03/2023 0738 Last  data filed at 03/03/2023 0612 Gross per 24 hour  Intake 842.14 ml  Output 1675 ml  Net -832.86 ml      03/03/2023    5:00 AM 03/02/2023    5:00 AM 03/01/2023    5:00 AM  Last 3 Weights  Weight (lbs) 154 lb 12.2 oz 154 lb 8.7 oz 157 lb 3 oz  Weight (kg) 70.2 kg 70.1 kg 71.3 kg      Telemetry  Overnight telemetry shows SR 60s with PVCs, which I personally reviewed.   Physical Exam   Vitals:   03/03/23 0400 03/03/23 0500 03/03/23 0600 03/03/23 0700  BP: 108/71 95/60 112/71 113/78  Pulse: (!) 55 (!) 54 (!) 56 63  Resp: 13 20 (!) 21 17  Temp:      TempSrc:      SpO2: 100% 100% 100% 100%  Weight:  70.2 kg    Height:        Intake/Output Summary (Last 24 hours) at 03/03/2023 0738 Last data filed at 03/03/2023 0612 Gross per 24 hour  Intake 842.14 ml  Output 1675 ml  Net -832.86 ml       03/03/2023    5:00 AM 03/02/2023    5:00 AM 03/01/2023  5:00 AM  Last 3 Weights  Weight (lbs) 154 lb 12.2 oz 154 lb 8.7 oz 157 lb 3 oz  Weight (kg) 70.2 kg 70.1 kg 71.3 kg    Body mass index is 24.24 kg/m.   General: Well nourished, well developed, in no acute distress Head: Atraumatic, normal size  Eyes: PEERLA, EOMI  Neck: Supple, JVD 5-7 cm H20 Endocrine: No thryomegaly Cardiac: Normal S1, S2; RRR; 3 out of 6 holosystolic murmur Lungs: Diminished breath sounds bilaterally Abd: Soft, nontender, no hepatomegaly  Ext: Trace edema Musculoskeletal: No deformities, BUE and BLE strength normal and equal Skin: Warm and dry, no rashes   Neuro: Alert and oriented to person, place, time, and situation, CNII-XII grossly intact, no focal deficits  Psych: Normal mood and affect   Labs  High Sensitivity Troponin:   Recent Labs  Lab 02/22/23 1534 02/22/23 1701  TROPONINIHS 45* 44*     Cardiac EnzymesNo results for input(s): "TROPONINI" in the last 168 hours. No results for input(s): "TROPIPOC" in the last 168 hours.  Chemistry Recent Labs  Lab 02/26/23 1140 02/27/23 0214 03/01/23 1432  03/02/23 0313 03/03/23 0427  NA  --    < > 128* 127* 127*  K  --    < > 3.8 3.6 3.5  CL  --    < > 80* 83* 89*  CO2  --    < > 38* 37* 29  GLUCOSE  --    < > 146* 153* 136*  BUN  --    < > '19 23 20  '$ CREATININE  --    < > 1.00 1.04* 1.07*  CALCIUM  --    < > 8.4* 8.3* 8.6*  PROT 6.3*  --   --   --   --   ALBUMIN 3.5  --   --   --   --   AST 223*  --   --   --   --   ALT 200*  --   --   --   --   ALKPHOS 193*  --   --   --   --   BILITOT 1.9*  --   --   --   --   GFRNONAA  --    < > >60 >60 58*  ANIONGAP  --    < > '10 7 9   '$ < > = values in this interval not displayed.    Hematology Recent Labs  Lab 03/01/23 0430 03/02/23 0313 03/03/23 0427  WBC 9.0 9.3 8.8  RBC 3.57* 3.74* 3.87  HGB 8.0* 8.4* 8.8*  HCT 27.1* 29.3* 30.6*  MCV 75.9* 78.3* 79.1*  MCH 22.4* 22.5* 22.7*  MCHC 29.5* 28.7* 28.8*  RDW 22.5* 23.7* 24.7*  PLT 247 236 243   BNPNo results for input(s): "BNP", "PROBNP" in the last 168 hours.  DDimer No results for input(s): "DDIMER" in the last 168 hours.   Radiology  ECHO TEE  Result Date: 03/01/2023    TRANSESOPHOGEAL ECHO REPORT   Patient Name:   Stacy Jenkins Date of Exam: 03/01/2023 Medical Rec #:  [id number]       Height:       67.0 in Accession #:    [id number]      Weight:       157.2 lb Date of Birth:  16-Dec-1961       BSA:          1.825 m Patient Age:    53 years  BP:           101/5465 mmHg Patient Gender: F               HR:           51 bpm. Exam Location:  Inpatient Procedure: Transesophageal Echo, Cardiac Doppler and Color Doppler Indications:    I34.0 Nonrheumatic mitral (valve) insufficiency  History:        Patient has prior history of Echocardiogram examinations, most                 recent 02/27/2023.                 Aortic Valve: bioprosthetic valve is present in the aortic                 position.  Sonographer:    Wilkie Aye RVT Referring Phys: [id number] BRIDGETTE CHRISTOPHER PROCEDURE: After discussion of the risks and benefits of a TEE, an  informed consent was obtained from the patient. The transesophogeal probe was passed without difficulty through the esophogus of the patient. Imaged were obtained with the patient in a left lateral decubitus position. Sedation performed by different physician. The patient developed no complications during the procedure.  IMPRESSIONS  1. Left ventricular ejection fraction, by estimation, is 30 to 35%. The left ventricle has moderately decreased function. The left ventricle demonstrates global hypokinesis. The left ventricular internal cavity size was severely dilated.  2. Right ventricular systolic function is moderately reduced. The right ventricular size is moderately enlarged.  3. Left atrial size was severely dilated. No left atrial/left atrial appendage thrombus was detected.  4. Right atrial size was severely dilated.  5. No evidence of flail or prolapse. Severe MR with PV flow reversal due to LV dilation/lack of central coaptation (Carpentier class 3b). Central gap between anterior and posterior leaflet measures between 0.5-0.7 cm. The mitral valve is abnormal. Severe mitral valve regurgitation. No evidence of mitral stenosis.  6. Tricuspid valve regurgitation is severe.  7. The aortic valve has been repaired/replaced. Aortic valve regurgitation is not visualized. No aortic stenosis is present. There is a bioprosthetic valve present in the aortic position.  8. Evidence of atrial level shunting detected by color flow Doppler. There is a small patent foramen ovale with predominantly left to right shunting across the atrial septum. Conclusion(s)/Recommendation(s): Severe MR with noncoaptation of leaflets, severe TR, +PFO, moderate to severe biventricular heart failure. FINDINGS  Left Ventricle: Left ventricular ejection fraction, by estimation, is 30 to 35%. The left ventricle has moderately decreased function. The left ventricle demonstrates global hypokinesis. The left ventricular internal cavity size was  severely dilated. Right Ventricle: The right ventricular size is moderately enlarged. No increase in right ventricular wall thickness. Right ventricular systolic function is moderately reduced. Left Atrium: Left atrial size was severely dilated. No left atrial/left atrial appendage thrombus was detected. Right Atrium: Right atrial size was severely dilated. Pericardium: There is no evidence of pericardial effusion. Mitral Valve: No evidence of flail or prolapse. Severe MR with PV flow reversal due to LV dilation/lack of central coaptation (Carpentier class 3b). Central gap between anterior and posterior leaflet measures between 0.5-0.7 cm. The mitral valve is abnormal. Severe mitral valve regurgitation. No evidence of mitral valve stenosis. Tricuspid Valve: The tricuspid valve is normal in structure. Tricuspid valve regurgitation is severe. No evidence of tricuspid stenosis. Aortic Valve: The aortic valve has been repaired/replaced. Aortic valve regurgitation is not visualized. No aortic stenosis is present. There is  a bioprosthetic valve present in the aortic position. Pulmonic Valve: The pulmonic valve was grossly normal. Pulmonic valve regurgitation is trivial. No evidence of pulmonic stenosis. Aorta: The aortic root is normal in size and structure. IAS/Shunts: Evidence of atrial level shunting detected by color flow Doppler. There is no evidence of a patent foramen ovale. A small patent foramen ovale is detected with predominantly left to right shunting across the atrial septum.  TRICUSPID VALVE TR Peak grad:   32.7 mmHg TR Vmax:        286.00 cm/s Buford Dresser MD Electronically signed by Buford Dresser MD Signature Date/Time: 03/01/2023/4:11:00 PM    Final     Cardiac Studies  TTE 02/27/2023  1. Severe, torrential MR. Central jet. Appears functional. TEE  recommended to clarify pathology. The mitral valve is abnormal. Severe  mitral valve regurgitation. No evidence of mitral stenosis.   2.  The tricuspid valve is abnormal. Tricuspid valve regurgitation is  severe.   3. Left ventricular ejection fraction, by estimation, is 25 to 30%. The  left ventricle has severely decreased function. The left ventricle  demonstrates global hypokinesis. The left ventricular internal cavity size  was severely dilated. Left ventricular  diastolic function could not be evaluated.   4. Right ventricular systolic function is moderately reduced. The right  ventricular size is moderately enlarged. There is mildly elevated  pulmonary artery systolic pressure. The estimated right ventricular  systolic pressure is 99991111 mmHg.   5. Left atrial size was severely dilated.   6. Right atrial size was severely dilated.   7. The aortic valve has been repaired/replaced. Aortic valve  regurgitation is not visualized. There is a unknown bioprosthetic valve  present in the aortic position. Echo findings are consistent with normal  structure and function of the aortic valve  prosthesis. Aortic valve area, by VTI measures 2.74 cm. Aortic valve mean  gradient measures 10.7 mmHg. Aortic valve Vmax measures 2.20 m/s.   8. The inferior vena cava is dilated in size with <50% respiratory  variability, suggesting right atrial pressure of 15 mmHg.   LHC 2019 @ [facility] Left coronary artery:  Left main has 10% stenosis and bifurcates distally  LAD is a large vessel that wraps the apex with less than 25% stenosis in the midportion of the vessel. Diagonal one is moderate size. Diagonal 2 is small.  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 Less than 25% stenosis noted   Patient Profile  Stacy Jenkins is a 62 y.o. female with aortic valve replacement, stroke, systolic heart failure, hypertension who was admitted on 02/22/2023 for acute on chronic systolic heart failure.  Also with A-fib with RVR.  Found to have severe mitral valve  regurgitation without options for intervention.  Assessment & Plan   # Acute on chronic systolic heart failure # Cardiogenic shock # Severe mitral valve regurgitation -Net -12.1 L since admission.  Volume status is much improved. -Nonischemic cardiomyopathy.  Left heart cath in 2019 with minimal CAD at [facility]. -Currently in the ICU with cardiogenic shock.  On Levophed.  Pressures are stable.  Co. ox is stable. -CVP 4 mmHg.  No further IV diuresis.  Transition to 40 mg of p.o. Lasix. -No beta-blocker in the setting of cardiogenic shock.  Wean Levophed today. -Add digoxin 0.125 mg daily.  Continue Aldactone 50 mg daily.  No beta-blocker.  Would avoid ACE/ARB/Arni at this point given low blood pressure.  Does  not appear to be a candidate for advanced therapies.  Not a candidate for MitraClip per week to 18. -Options are palliative.  Palliative care has seen her.  She has wanted to continue with aggressive goals of care.  For now we will try to wean her off Levophed.  If we are unable to do so we will need to readdress goals of care.  # Polymorphic VT -Noted to have polymorphic VT that self terminated on 03/02/2023.  Currently on amiodarone drip. -Checks he converted back to sinus rhythm.  See discussion below. -Transition to oral amiodarone.  400 mg twice daily for 5 days followed by 200 mg daily.  This is the best strategy given her A-fib.  # Persistent Afib -Maintaining sinus rhythm.  On amiodarone drip.  Transition to oral amiodarone.  Add back IV amiodarone if needed. -No left atrial appendage thrombus on TEE. -Given ejection fraction is low and and severe mitral valve regurgitation would recommend to continue with rhythm control strategy with amiodarone.  Very limited options per reports. -Continue Eliquis 5 mg twice daily.  # History of severe aortic regurgitation status post AVR -Normal bioprosthesis by echo.  # Ascending aortic repair -Stable  # Minimal CAD -Left heart cath in 2019.   Continue medical management.  FEN -No intravenous fluids -Code: Full -Diet: Heart healthy -DVT PPx: Eliquis -Disposition: Currently in cardiogenic shock with limited options.  Palliative care consultation today.  Her issues appear to be mechanical related to mitral valve regurgitation with limited options for repair.  CRITICAL CARE Performed by: Lake Bells T O'Neal  Total critical care time: 45 minutes. Critical care time was exclusive of separately billable procedures and treating other patients. Critical care was necessary to treat or prevent imminent or life-threatening deterioration. Critical care was time spent personally by me on the following activities: development of treatment plan with patient and/or surrogate as well as nursing, discussions with consultants, evaluation of patient's response to treatment, examination of patient, obtaining history from patient or surrogate, ordering and performing treatments and interventions, ordering and review of laboratory studies, ordering and review of radiographic studies, pulse oximetry and re-evaluation of patient's condition.  For questions or updates, please contact Horace Please consult [email or web address] for contact info under   Signed, Lake Bells T. Audie Box, MD, [place]  03/03/2023 7:38 AM

## 2023-03-04 DIAGNOSIS — I4819 Other persistent atrial fibrillation: Secondary | ICD-10-CM | POA: Diagnosis not present

## 2023-03-04 DIAGNOSIS — I340 Nonrheumatic mitral (valve) insufficiency: Secondary | ICD-10-CM | POA: Diagnosis not present

## 2023-03-04 DIAGNOSIS — R570 Cardiogenic shock: Secondary | ICD-10-CM | POA: Diagnosis not present

## 2023-03-04 DIAGNOSIS — I5023 Acute on chronic systolic (congestive) heart failure: Secondary | ICD-10-CM | POA: Diagnosis not present

## 2023-03-04 DIAGNOSIS — I472 Ventricular tachycardia, unspecified: Secondary | ICD-10-CM

## 2023-03-04 LAB — GLUCOSE, CAPILLARY
Glucose-Capillary: 104 mg/dL — ABNORMAL HIGH (ref 70–99)
Glucose-Capillary: 89 mg/dL (ref 70–99)
Glucose-Capillary: 151 mg/dL — ABNORMAL HIGH (ref 70–99)
Glucose-Capillary: 109 mg/dL — ABNORMAL HIGH (ref 70–99)
Glucose-Capillary: 89 mg/dL (ref 70–99)

## 2023-03-04 LAB — CBC
HCT: 32.8 % — ABNORMAL LOW (ref 36.0–46.0)
Hemoglobin: 9.3 g/dL — ABNORMAL LOW (ref 12.0–15.0)
MCH: 22.7 pg — ABNORMAL LOW (ref 26.0–34.0)
MCHC: 28.4 g/dL — ABNORMAL LOW (ref 30.0–36.0)
MCV: 80.2 fL (ref 80.0–100.0)
Platelets: 236 10*3/uL (ref 150–400)
RBC: 4.09 MIL/uL (ref 3.87–5.11)
RDW: 25.5 % — ABNORMAL HIGH (ref 11.5–15.5)
WBC: 9.9 10*3/uL (ref 4.0–10.5)
nRBC: 0 % (ref 0.0–0.2)

## 2023-03-04 LAB — BASIC METABOLIC PANEL
Anion gap: 8 (ref 5–15)
BUN: 28 mg/dL — ABNORMAL HIGH (ref 8–23)
CO2: 27 mmol/L (ref 22–32)
Calcium: 9 mg/dL (ref 8.9–10.3)
Chloride: 96 mmol/L — ABNORMAL LOW (ref 98–111)
Creatinine, Ser: 0.97 mg/dL (ref 0.44–1.00)
GFR, Estimated: >60 mL/min (ref >60)
Glucose, Bld: 97 mg/dL (ref 70–99)
Potassium: 4 mmol/L (ref 3.5–5.1)
Sodium: 131 mmol/L — ABNORMAL LOW (ref 135–145)

## 2023-03-04 LAB — MAGNESIUM: Magnesium: 2.3 mg/dL (ref 1.7–2.4)

## 2023-03-04 LAB — COOXEMETRY PANEL
Carboxyhemoglobin: 2.4 % — ABNORMAL HIGH (ref 0.5–1.5)
Methemoglobin: <0.7 % (ref 0.0–1.5)
O2 Saturation: 69.6 %
Total hemoglobin: 9.5 g/dL — ABNORMAL LOW (ref 12.0–16.0)

## 2023-03-04 NOTE — Progress Notes (Signed)
Cardiology Progress Note  Patient ID: Stacy Jenkins MRN: [id number] DOB: 11-19-1961 Date of Encounter: 03/04/2023  Primary Cardiologist: [name], MD  Subjective   Chief Complaint: SOB  HPI: Doing well.  Off Levophed.  Appears euvolemic.  ROS:  All other ROS reviewed and negative. Pertinent positives noted in the HPI.     Inpatient Medications  Scheduled Meds:  amiodarone  400 mg Oral BID   Followed by   Derrill Memo ON 03/10/2023] amiodarone  200 mg Oral Daily   apixaban  5 mg Oral BID   Chlorhexidine Gluconate Cloth  6 each Topical Daily   digoxin  0.125 mg Oral Daily   furosemide  40 mg Oral Daily   insulin aspart  0-15 Units Subcutaneous TID WC   insulin aspart  0-5 Units Subcutaneous QHS   midodrine  10 mg Oral TID WC   mouth rinse  15 mL Mouth Rinse 4 times per day   polyethylene glycol  17 g Oral Daily   rosuvastatin  5 mg Oral Daily   sodium chloride flush  10-40 mL Intracatheter Q12H   sodium chloride flush  3 mL Intravenous Q12H   sodium chloride flush  3 mL Intravenous Q12H   sodium chloride flush  3 mL Intravenous Q12H   spironolactone  50 mg Oral Daily   Continuous Infusions:  sodium chloride 250 mL (02/28/23 0500)   sodium chloride 10 mL/hr at 03/01/23 1000   sodium chloride     norepinephrine (LEVOPHED) Adult infusion Stopped (03/03/23 1139)   PRN Meds: sodium chloride, sodium chloride, acetaminophen, LORazepam, ondansetron (ZOFRAN) IV, mouth rinse, mouth rinse, sodium chloride flush, sodium chloride flush, sodium chloride flush, traZODone   Vital Signs   Vitals:   03/04/23 0600 03/04/23 0700 03/04/23 0722 03/04/23 0800  BP: 95/70 (!) 89/57  93/78  Pulse: 69 63  77  Resp: '19 15  17  '$ Temp:   97.7 F (36.5 C)   TempSrc:   Oral   SpO2: 99% 90%  93%  Weight:      Height:        Intake/Output Summary (Last 24 hours) at 03/04/2023 0913 Last data filed at 03/04/2023 0800 Gross per 24 hour  Intake 140.61 ml  Output 2100 ml  Net -1959.39 ml       03/04/2023    1:53 AM 03/03/2023    5:00 AM 03/02/2023    5:00 AM  Last 3 Weights  Weight (lbs) 152 lb 8.9 oz 154 lb 12.2 oz 154 lb 8.7 oz  Weight (kg) 69.2 kg 70.2 kg 70.1 kg      Telemetry  Overnight telemetry shows sinus rhythm 70s, paroxysmal A-fib, which I personally reviewed.    Physical Exam   Vitals:   03/04/23 0600 03/04/23 0700 03/04/23 0722 03/04/23 0800  BP: 95/70 (!) 89/57  93/78  Pulse: 69 63  77  Resp: '19 15  17  '$ Temp:   97.7 F (36.5 C)   TempSrc:   Oral   SpO2: 99% 90%  93%  Weight:      Height:        Intake/Output Summary (Last 24 hours) at 03/04/2023 0913 Last data filed at 03/04/2023 0800 Gross per 24 hour  Intake 140.61 ml  Output 2100 ml  Net -1959.39 ml       03/04/2023    1:53 AM 03/03/2023    5:00 AM 03/02/2023    5:00 AM  Last 3 Weights  Weight (lbs) 152 lb 8.9 oz 154  lb 12.2 oz 154 lb 8.7 oz  Weight (kg) 69.2 kg 70.2 kg 70.1 kg    Body mass index is 23.89 kg/m.  General: Well nourished, well developed, in no acute distress Head: Atraumatic, normal size  Eyes: PEERLA, EOMI  Neck: Supple, no JVD Endocrine: No thryomegaly Cardiac: Normal S1, S2; RRR; 3 out of 6 holosystolic murmur Lungs: Clear to auscultation bilaterally, no wheezing, rhonchi or rales  Abd: Soft, nontender, no hepatomegaly  Ext: No edema, pulses 2+ Musculoskeletal: No deformities, BUE and BLE strength normal and equal Skin: Warm and dry, no rashes   Neuro: Alert and oriented to person, place, time, and situation, CNII-XII grossly intact, no focal deficits  Psych: Normal mood and affect   Labs  High Sensitivity Troponin:   Recent Labs  Lab 02/22/23 1534 02/22/23 1701  TROPONINIHS 45* 44*     Cardiac EnzymesNo results for input(s): "TROPONINI" in the last 168 hours. No results for input(s): "TROPIPOC" in the last 168 hours.  Chemistry Recent Labs  Lab 02/26/23 1140 02/27/23 0214 03/02/23 0313 03/03/23 0427 03/04/23 0357  NA  --    < > 127* 127* 131*  K  --     < > 3.6 3.5 4.0  CL  --    < > 83* 89* 96*  CO2  --    < > 37* 29 27  GLUCOSE  --    < > 153* 136* 97  BUN  --    < > 23 20 28*  CREATININE  --    < > 1.04* 1.07* 0.97  CALCIUM  --    < > 8.3* 8.6* 9.0  PROT 6.3*  --   --   --   --   ALBUMIN 3.5  --   --   --   --   AST 223*  --   --   --   --   ALT 200*  --   --   --   --   ALKPHOS 193*  --   --   --   --   BILITOT 1.9*  --   --   --   --   GFRNONAA  --    < > >60 58* >60  ANIONGAP  --    < > '7 9 8   '$ < > = values in this interval not displayed.    Hematology Recent Labs  Lab 03/02/23 0313 03/03/23 0427 03/04/23 0357  WBC 9.3 8.8 9.9  RBC 3.74* 3.87 4.09  HGB 8.4* 8.8* 9.3*  HCT 29.3* 30.6* 32.8*  MCV 78.3* 79.1* 80.2  MCH 22.5* 22.7* 22.7*  MCHC 28.7* 28.8* 28.4*  RDW 23.7* 24.7* 25.5*  PLT 236 243 236   BNPNo results for input(s): "BNP", "PROBNP" in the last 168 hours.  DDimer No results for input(s): "DDIMER" in the last 168 hours.   Radiology  No results found.  Cardiac Studies  TTE 02/27/2023  1. Severe, torrential MR. Central jet. Appears functional. TEE  recommended to clarify pathology. The mitral valve is abnormal. Severe  mitral valve regurgitation. No evidence of mitral stenosis.   2. The tricuspid valve is abnormal. Tricuspid valve regurgitation is  severe.   3. Left ventricular ejection fraction, by estimation, is 25 to 30%. The  left ventricle has severely decreased function. The left ventricle  demonstrates global hypokinesis. The left ventricular internal cavity size  was severely dilated. Left ventricular  diastolic function could not be evaluated.   4. Right  ventricular systolic function is moderately reduced. The right  ventricular size is moderately enlarged. There is mildly elevated  pulmonary artery systolic pressure. The estimated right ventricular  systolic pressure is 99991111 mmHg.   5. Left atrial size was severely dilated.   6. Right atrial size was severely dilated.   7. The aortic  valve has been repaired/replaced. Aortic valve  regurgitation is not visualized. There is a unknown bioprosthetic valve  present in the aortic position. Echo findings are consistent with normal  structure and function of the aortic valve  prosthesis. Aortic valve area, by VTI measures 2.74 cm. Aortic valve mean  gradient measures 10.7 mmHg. Aortic valve Vmax measures 2.20 m/s.   8. The inferior vena cava is dilated in size with <50% respiratory  variability, suggesting right atrial pressure of 15 mmHg.   LHC 2019 @ [facility] Left coronary artery:  Left main has 10% stenosis and bifurcates distally  LAD is a large vessel that wraps the apex with less than 25% stenosis in the midportion of the vessel. Diagonal one is moderate size. Diagonal 2 is small.  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 Less than 25% stenosis noted   Patient Profile  Heath Caverly is a 62 y.o. female with aortic valve replacement, stroke, systolic heart failure, hypertension who was admitted on 02/22/2023 for acute on chronic systolic heart failure.  Also with A-fib with RVR.  Found to have severe mitral valve regurgitation without options for intervention.   Assessment & Plan   # Acute on chronic systolic heart failure, EF 25-30% #Cardiogenic shock #Severe mitral valve regurgitation -She has been weaned off of Levophed.  Co. ox is stable.  On oral diuretics and appears euvolemic. -Has severe mitral valve regurgitation but not a candidate for intervention.  Will continue with medical management.  Her approach is palliative. -Continue digoxin 0.125 mg daily.  Continue Aldactone 50 mg daily.  Avoid AV nodal agents.  Not a good candidate for ACE/ARB/ARNI given hypotension. -Continue to trend Co. ox values.  Would have advanced heart failure see her tomorrow.  Very limited options from prior documentation.  Also on midodrine.  #  Persistent A-fib -Brief A-fib overnight.  Continue amiodarone.  400 mg twice daily for 7 days.  Then 200 mg daily. -Continue Eliquis 5 mg twice daily. -She was admitted with A-fib.  She has converted on amiodarone.  Left atrial appendage thrombus was ruled out by transesophageal echo.  # Polymorphic VT -Noted to have polymorphic VT this admission.  Currently on amiodarone as above.  No further recurrence.  # History of aortic valve replacement -Stable.  # Ascending aortic repair -Stable  # Minimal CAD -Left heart cath 2019 at [facility].  Continue medical management.  FEN -No intravenous fluids. -Code: Full -Diet: Heart healthy -DVT PPx: Eliquis -Disposition: Transfer out of the unit.  Needs aggressive goals of care discussion.  She is likely hospice appropriate.  For questions or updates, please contact [place] Please consult [email or web address] for contact info under        Signed, [place] T. Audie Box, MD, [place]  03/04/2023 9:13 AM

## 2023-03-04 NOTE — Progress Notes (Signed)
Patient in no respiratory distress this morning. BiPAP on standby if needed.

## 2023-03-05 ENCOUNTER — Encounter (HOSPITAL_COMMUNITY): Payer: Self-pay | Admitting: Cardiology

## 2023-03-05 LAB — COOXEMETRY PANEL
Carboxyhemoglobin: 1.9 % — ABNORMAL HIGH (ref 0.5–1.5)
Methemoglobin: <0.7 % (ref 0.0–1.5)
O2 Saturation: 69.5 %
Total hemoglobin: 9.7 g/dL — ABNORMAL LOW (ref 12.0–16.0)

## 2023-03-05 LAB — GLUCOSE, CAPILLARY
Glucose-Capillary: 88 mg/dL (ref 70–99)
Glucose-Capillary: 172 mg/dL — ABNORMAL HIGH (ref 70–99)
Glucose-Capillary: 107 mg/dL — ABNORMAL HIGH (ref 70–99)
Glucose-Capillary: 110 mg/dL — ABNORMAL HIGH (ref 70–99)

## 2023-03-05 LAB — BASIC METABOLIC PANEL
Anion gap: 12 (ref 5–15)
BUN: 34 mg/dL — ABNORMAL HIGH (ref 8–23)
CO2: 25 mmol/L (ref 22–32)
Calcium: 9.1 mg/dL (ref 8.9–10.3)
Chloride: 96 mmol/L — ABNORMAL LOW (ref 98–111)
Creatinine, Ser: 1.09 mg/dL — ABNORMAL HIGH (ref 0.44–1.00)
GFR, Estimated: 57 mL/min — ABNORMAL LOW (ref >60)
Glucose, Bld: 89 mg/dL (ref 70–99)
Potassium: 3.7 mmol/L (ref 3.5–5.1)
Sodium: 133 mmol/L — ABNORMAL LOW (ref 135–145)

## 2023-03-05 LAB — CBC
HCT: 33.1 % — ABNORMAL LOW (ref 36.0–46.0)
Hemoglobin: 9.5 g/dL — ABNORMAL LOW (ref 12.0–15.0)
MCH: 23.1 pg — ABNORMAL LOW (ref 26.0–34.0)
MCHC: 28.7 g/dL — ABNORMAL LOW (ref 30.0–36.0)
MCV: 80.3 fL (ref 80.0–100.0)
Platelets: 220 10*3/uL (ref 150–400)
RBC: 4.12 MIL/uL (ref 3.87–5.11)
RDW: 26.3 % — ABNORMAL HIGH (ref 11.5–15.5)
WBC: 7.8 10*3/uL (ref 4.0–10.5)
nRBC: 0 % (ref 0.0–0.2)

## 2023-03-05 LAB — MAGNESIUM: Magnesium: 2.4 mg/dL (ref 1.7–2.4)

## 2023-03-05 MED ORDER — DAPAGLIFLOZIN PROPANEDIOL 10 MG PO TABS
10.0000 mg | ORAL_TABLET | Freq: Every day | ORAL | Status: DC
  Administered 2023-03-05 – 2023-03-08 (×4): 10 mg via ORAL
  Filled 2023-03-05 (×4): qty 1

## 2023-03-05 MED ORDER — SORBITOL 70 % SOLN
30.0000 mL | Freq: Once | Status: AC
  Administered 2023-03-05: 30 mL via ORAL
  Filled 2023-03-05: qty 30

## 2023-03-05 MED ORDER — POTASSIUM CHLORIDE CRYS ER 20 MEQ PO TBCR
40.0000 meq | EXTENDED_RELEASE_TABLET | Freq: Once | ORAL | Status: AC
  Administered 2023-03-05: 40 meq via ORAL
  Filled 2023-03-05: qty 2

## 2023-03-05 MED ORDER — DOCUSATE SODIUM 100 MG PO CAPS
100.0000 mg | ORAL_CAPSULE | Freq: Two times a day (BID) | ORAL | Status: DC
  Administered 2023-03-05 – 2023-03-08 (×7): 100 mg via ORAL
  Filled 2023-03-05 (×6): qty 1

## 2023-03-05 NOTE — Progress Notes (Addendum)
Advanced Heart Failure Rounding Note  PCP-Cardiologist: [name], MD   Subjective:    3/4 S/P RHC RA 15, PA 60/31 (40)PCWP 30>50, Fick CO/CI 5.3 CI 2.8 . Started on Norepi 5 mcg and diuresed with IV lasix.  3/5 Diuresed with IV lasix + Diuril. Swallow study with recommendations for mildly thick liquids.  Back in SR.  3/6 K 2. Diuretics held. Palliative Care  3/7 Long run of PMVT, spontaneously broke. 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 EF 30-35%, RV mod reduced, severe TR  3/9 Norepi stopped.     Wants to go home. "I want out of here".   Objective:   Weight Range: 67.8 kg Body mass index is 23.41 kg/m.   Vital Signs:   Temp:  [97.4 F (36.3 C)-97.9 F (36.6 C)] 97.4 F (36.3 C) (03/11 0743) Pulse Rate:  [63-80] 74 (03/11 0800) Resp:  [14-22] 18 (03/11 0800) BP: (86-117)/(58-83) 112/83 (03/11 0800) SpO2:  [90 %-98 %] 97 % (03/11 0800) Weight:  [67.8 kg] 67.8 kg (03/11 0500) Last BM Date : 02/23/23  Weight change: Filed Weights   03/03/23 0500 03/04/23 0153 03/05/23 0500  Weight: 70.2 kg 69.2 kg 67.8 kg    Intake/Output:   Intake/Output Summary (Last 24 hours) at 03/05/2023 0833 Last data filed at 03/05/2023 0800 Gross per 24 hour  Intake 724.38 ml  Output 1250 ml  Net -525.62 ml      Physical Exam   CVP 2-3  General:  In bed. No resp difficulty HEENT: normal Neck: supple. no JVD. Carotids 2+ bilat; no bruits. No lymphadenopathy or thryomegaly appreciated. Cor: PMI nondisplaced. Regular rate & rhythm. No rubs, gallops. RUSB LLSB murmurs. Lungs: clear Abdomen: soft, nontender, nondistended. No hepatosplenomegaly. No bruits or masses. Good bowel sounds. Extremities: no cyanosis, clubbing, rash, R and LLE unna boots. RUE pICC  Neuro: alert & orientedx3, cranial nerves grossly intact. moves all 4 extremities w/o difficulty. Affect pleasant  Telemetry    SR 70-80s personally checked.   EKG    N/A  Labs    CBC Recent Labs    03/04/23 0357 03/05/23 0515  WBC 9.9 7.8  HGB 9.3* 9.5*  HCT 32.8* 33.1*  MCV 80.2 80.3  PLT 236 [id number]   Basic Metabolic Panel Recent Labs    03/04/23 0357 03/05/23 0515  NA 131* 133*  K 4.0 3.7  CL 96* 96*  CO2 27 25  GLUCOSE 97 89  BUN 28* 34*  CREATININE 0.97 1.09*  CALCIUM 9.0 9.1  MG 2.3 2.4   Liver Function Tests No results for input(s): "AST", "ALT", "ALKPHOS", "BILITOT", "PROT", "ALBUMIN" in the last 72 hours.  No results for input(s): "LIPASE", "AMYLASE" in the last 72 hours. Cardiac Enzymes No results for input(s): "CKTOTAL", "CKMB", "CKMBINDEX", "TROPONINI" in the last 72 hours.  BNP: BNP (last 3 results) Recent Labs    02/22/23 1534  BNP 876.3*    ProBNP (last 3 results) Recent Labs    02/21/23 1203  PROBNP 4,604*     D-Dimer No results for input(s): "DDIMER" in the last 72 hours. Hemoglobin A1C No results for input(s): "HGBA1C" in the last 72 hours. Fasting Lipid Panel No results for input(s): "CHOL", "HDL", "LDLCALC", "TRIG", "CHOLHDL", "LDLDIRECT" in the last 72 hours. Thyroid Function Tests No results for input(s): "TSH", "T4TOTAL", "T3FREE", "THYROIDAB" in the last 72 hours.  Invalid input(s): "FREET3"  Other results:   Imaging    No results found.   Medications:     Scheduled Medications:  amiodarone  400 mg Oral BID   Followed by   Derrill Memo ON 03/10/2023] amiodarone  200 mg Oral Daily   apixaban  5 mg Oral BID   Chlorhexidine Gluconate Cloth  6 each Topical Daily   digoxin  0.125 mg Oral Daily   furosemide  40 mg Oral Daily   insulin aspart  0-15 Units Subcutaneous TID WC   insulin aspart  0-5 Units Subcutaneous QHS   midodrine  10 mg Oral TID WC   mouth rinse  15 mL Mouth Rinse 4 times per day   polyethylene glycol  17 g Oral Daily   rosuvastatin  5 mg Oral Daily   sodium chloride flush  10-40 mL Intracatheter Q12H   sodium chloride  flush  3 mL Intravenous Q12H   sodium chloride flush  3 mL Intravenous Q12H   sodium chloride flush  3 mL Intravenous Q12H   spironolactone  50 mg Oral Daily    Infusions:  sodium chloride 250 mL (02/28/23 0500)   sodium chloride 10 mL/hr at 03/05/23 0800   sodium chloride      PRN Medications: sodium chloride, sodium chloride, acetaminophen, LORazepam, ondansetron (ZOFRAN) IV, mouth rinse, mouth rinse, sodium chloride flush, sodium chloride flush, sodium chloride flush, traZODone    Patient Profile   Stacy Jenkins is a 62 year old with a history of CVA, hypertension, hyperlipidemia, minimal CAD, patent foramen ovale, chronic systolic heart failure, 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    Admitted 02/22/2023 for the evaluation of dyspnea.   Assessment/Plan     1.  Shock --> Suspect Cardiogenic -Most recent ECHO EF 45-50% at [facility]. Will need to repeat. Concerned EF will be worse due to ongoing A fib RVR.  Persistent hypotension with elevated lactic acid. CO2 27 SCAI C. Advanced Heart Failure Team Consulted  Lactic acid clearing 3.4>1.4  RHC elevated filling pressures CO 5.3 CI 2.8  - off pressors.  CO-OX 70% -  CVP 2-3. Hold lasix.  -Continue spiro 50 mg daily - Continue midodrine 10 mg tid.  - Renal function stable.   2. A fib RVR  - Maintaining SR. Continue amio taper.  Cont amio  gtt - ON eliquis  5 mg twice a day.    3. Acute Hypoxic Respiratory Failure ,  - improving w/ diuresis  - CCM following.    4. MR  - TEE 3/7 severe functional MR with noncoaptation of the mitral valve leaflets.  No clear flail or prolapse. Severe TR, +PFO. Moderately reduced biventricular function - d/w structural heart team at bedside. Anatomy likely not favorable for clip    5.  Iron Deficiency Anemia  -Recent GI bleed 01/2023  -2024 EGD/Colonoscopy with diverticulosis.  -Hgb stable 9.5  -No obvious  source.  -Fe 18, Tsat 4. Treated w/ IV Fe  -Check FOBT.   6. CAD 2019 Non obstructive CAD-->10% left main, <25% mid LAD, <25% RCA lesion.  - No chest pain.    7. H/O Bioprosthetic AVR 2019    8. DMII  -Hgb A1C 8 -Continue SSI    9. H/O CVA   10. Hypokalemia - K repleted.   11. NSVT/ PMVT  - On oral amio taper.  - Keep K > 4.0 and Mg > 2.0    12. [place] Stacy Jenkins told staff and me  that Stacy Jenkins wants to die.  Palliative Care appreciated. Continue full code.  Needs follow up.   93. Depression Psychiatry consulted. Refused antidepressants.   '[place] to transfer to progressive care.     Length of Stay: [place], NP  03/05/2023, 8:33 AM  Advanced Heart Failure Team Pager [phone] (M-F; 7a - 5p)  Please contact [facility] for night-coverage after hours (5p -7a ) and weekends on [email or web address]  Patient seen with NP, agree with the above note.    Stacy Jenkins is in NSR this morning, off pressors/inotropes.  Co-ox 70%.  CVP 1-2.    Not motivated to walk.  Says Stacy Jenkins wants to go home to die.   General: NAD Neck: No JVD, no thyromegaly or thyroid nodule.  Lungs: Clear to auscultation bilaterally with normal respiratory effort. CV: Nondisplaced PMI.  Heart regular S1/S2, no S3/S4, 2/6 HSM apex.  No peripheral edema.   Abdomen: Soft, nontender, no hepatosplenomegaly, no distention.  Skin: Intact without lesions or rashes.  Neurologic: Alert and oriented x 3.  Psych: Normal affect. Extremities: No clubbing or cyanosis.  HEENT: Normal.   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  Markedly dilated LV with functional MR due to poorly coapting leaflets, TEE was reviewed with structural heart team last week and Stacy Jenkins was not thought to be a candidate for Mitraclip.  Not candidate for open surgery. Volume status significantly improved.  - Hold diuretics today, can likely restart tomorrow.  - Continue midodrine 10 tid for now. - Continue digoxin 0.125 - Continue spironolactone 50  daily.  - Add Farxiga 10 daily.  - No interventional option for MV, will do the best we can with meds.   Stacy Jenkins is in NSR on amiodarone and apixaban.   Patient states today that Stacy Jenkins wants to go home "to die."  Stacy Jenkins was saying similar things last week as well. Stacy Jenkins is still listed as full code.  Palliative care has been seeing.  I will have them come back by today.  I think hospital would be reasonable, Stacy Jenkins has CHF from markedly severe MR and it does not appear that we have an interventional option.   Loralie Champagne 03/05/2023 9:08 AM

## 2023-03-05 NOTE — Progress Notes (Signed)
When placing purewick on patient there was a moderate sized mass on her right labia. Patient stated that she has always had it but never had it looked at by a provider. Patient states that she does not go to an OBGYN. Recommended patient have it assessed by a provider.

## 2023-03-05 NOTE — Progress Notes (Signed)
Physical Therapy Treatment Patient Details Name: Stacy Jenkins MRN: [id number] DOB: 1961/09/14 Today's Date: 03/05/2023   History of Present Illness 62 y/o female presented 2/29 for elective TEE. Pt with SOB and tachycardia, TEE cancelled due to A-fib with RVR and hypoxia. Admitted with acute respiratory failure secondary to volume overload, heart failure exacerbation and cardiogenic shock. PMHx: aortic aneurysm repair, CVA, AVR, HFrEF, A-fib, CHF, HTN, DM.    PT Comments    Pt tolerates treatment well, ambulating for household distances. Pt benefits from cues for hand placement during session. Pt perseverates on wanting to leave the hospital and go home, also reports wanting to die during this session (palliative consult in orders). Pt will benefit from continued acute PT services to further improve activity tolerance and strength, if this remains in alignment with pt's goals of care. PT continues to recommend HHPT at this time.   Recommendations for follow up therapy are one component of a multi-disciplinary discharge planning process, led by the attending physician.  Recommendations may be updated based on patient status, additional functional criteria and insurance authorization.  Follow Up Recommendations  Home health PT     Assistance Recommended at Discharge Frequent or constant Supervision/Assistance  Patient can return home with the following A little help with walking and/or transfers;A little help with bathing/dressing/bathroom;Help with stairs or ramp for entrance;Assist for transportation;Assistance with cooking/housework   Equipment Recommendations  Rolling walker (2 wheels);BSC/3in1    Recommendations for Other Services       Precautions / Restrictions Precautions Precautions: Fall Precaution Comments: watch O2 Restrictions Weight Bearing Restrictions: No     Mobility  Bed Mobility Overal bed mobility: Needs Assistance Bed Mobility: Supine to Sit     Supine to  sit: Min guard, HOB elevated          Transfers Overall transfer level: Needs assistance Equipment used: Rolling walker (2 wheels) Transfers: Sit to/from Stand Sit to Stand: Min guard                Ambulation/Gait Ambulation/Gait assistance: Counsellor (Feet): 300 Feet Assistive device: Rolling walker (2 wheels) Gait Pattern/deviations: Step-through pattern Gait velocity: reduced Gait velocity interpretation: <1.8 ft/sec, indicate of risk for recurrent falls   General Gait Details: slowed tep-through gait, reduced stride length   Stairs             Wheelchair Mobility    Modified Rankin (Stroke Patients Only)       Balance Overall balance assessment: Needs assistance Sitting-balance support: No upper extremity supported, Feet supported Sitting balance-Leahy Scale: Good     Standing balance support: Single extremity supported, Reliant on assistive device for balance Standing balance-Leahy Scale: Poor                              Cognition Arousal/Alertness: Awake/alert Behavior During Therapy: Flat affect Overall Cognitive Status: Impaired/Different from baseline Area of Impairment: Orientation, Memory, Safety/judgement, Problem solving                 Orientation Level: Disoriented to, Time   Memory: Decreased short-term memory Following Commands: Follows one step commands consistently Safety/Judgement: Decreased awareness of safety, Decreased awareness of deficits Awareness: Intellectual Problem Solving: Slow processing          Exercises      General Comments General comments (skin integrity, edema, etc.): VSS on RA      Pertinent Vitals/Pain Pain Assessment Pain Assessment: No/denies pain  Home Living                          Prior Function            PT Goals (current goals can now be found in the care plan section) Acute Rehab PT Goals Patient Stated Goal: go home Progress  towards PT goals: Progressing toward goals    Frequency    Min 3X/week      PT Plan Current plan remains appropriate    Co-evaluation              AM-PAC PT "6 Clicks" Mobility   Outcome Measure  Help needed turning from your back to your side while in a flat bed without using bedrails?: A Little Help needed moving from lying on your back to sitting on the side of a flat bed without using bedrails?: A Little Help needed moving to and from a bed to a chair (including a wheelchair)?: A Little Help needed standing up from a chair using your arms (e.g., wheelchair or bedside chair)?: A Little Help needed to walk in hospital room?: A Little Help needed climbing 3-5 steps with a railing? : A Lot 6 Click Score: 17    End of Session   Activity Tolerance: Patient tolerated treatment well Patient left: in chair;with call bell/phone within reach;with chair alarm set;with family/visitor present Nurse Communication: Mobility status PT Visit Diagnosis: Difficulty in walking, not elsewhere classified (R26.2);Muscle weakness (generalized) (M62.81);Unsteadiness on feet (R26.81)     Time: [id number] PT Time Calculation (min) (ACUTE ONLY): 17 min  Charges:  $Gait Training: 8-22 mins                     Zenaida Niece, PT, DPT Acute Rehabilitation Office [place] 03/05/2023, 5:12 PM

## 2023-03-05 NOTE — Progress Notes (Addendum)
   03/05/23 1145  Spiritual Encounters  Type of Visit Initial  Care provided to: [facility] present during encounter Nurse  Referral source Chaplain team  Reason for visit Routine spiritual support  OnCall Visit No   Melven Sartorius went to visit Pt but she was unavailable at the time. Chap then proceeded to waiting area and met family of Pt and spoke with them.  Chap practiced reflective listening and proved compassionate presence.  Family also asked for prayer and Melven Sartorius prayed with them. Will return to visit Pt this afternoon. Chap returned to find Pt eating dinner.  Pt wasn't very interested in conversaiton at this point other than repeatedly saying, "I wanna go home." Will attempt again tomorrow.

## 2023-03-05 NOTE — Progress Notes (Signed)
Occupational Therapy Treatment Patient Details Name: Stacy Jenkins MRN: [id number] DOB: [birth year] Today's Date: 03/05/2023   History of present illness 62 y/o female presented 2/29 for elective TEE. Pt with SOB and tachycardia, TEE cancelled due to A-fib with RVR and hypoxia. Admitted with acute respiratory failure secondary to volume overload, heart failure exacerbation and cardiogenic shock. PMHx: aortic aneurysm repair, CVA, AVR, HFrEF, A-fib, CHF, HTN, DM.   OT comments  Pt making good progress towards OT goals. Pt able to tolerate > 8 min ADLs standing at sink as well as a short walk around the unit using RW. No more than min guard needed to complete tasks and VSS on RA. Emphasis on energy conservation, DME use and gradual return to normal activity level. Pt flat affect throughout and hopeful to go home soon.    Recommendations for follow up therapy are one component of a multi-disciplinary discharge planning process, led by the attending physician.  Recommendations may be updated based on patient status, additional functional criteria and insurance authorization.    Follow Up Recommendations  Home health OT     Assistance Recommended at Discharge Frequent or constant Supervision/Assistance  Patient can return home with the following  A little help with bathing/dressing/bathroom;Help with stairs or ramp for entrance;Assistance with cooking/housework;Assist for transportation;Direct supervision/assist for financial management;Direct supervision/assist for medications management   Equipment Recommendations  Other (comment) (RW)    Recommendations for Other Services      Precautions / Restrictions Precautions Precautions: Fall Restrictions Weight Bearing Restrictions: No       Mobility Bed Mobility               General bed mobility comments: in chair on entry    Transfers Overall transfer level: Needs assistance Equipment used: Rolling walker (2  wheels) Transfers: Sit to/from Stand Sit to Stand: Supervision           General transfer comment: effortful to stand from recliner     Balance Overall balance assessment: Needs assistance Sitting-balance support: No upper extremity supported, Feet supported Sitting balance-Leahy Scale: Good     Standing balance support: Bilateral upper extremity supported, Reliant on assistive device for balance Standing balance-Leahy Scale: Fair                             ADL either performed or assessed with clinical judgement   ADL Overall ADL's : Needs assistance/impaired     Grooming: Supervision/safety;Standing;Oral care;Wash/dry face;Brushing hair Grooming Details (indicate cue type and reason): at least 8 min standing at sink                             Functional mobility during ADLs: Min guard;Rolling walker (2 wheels) General ADL Comments: Pt with very flat affect and slower pacing. Educated on energy conservation strategies, gradual return to normal activity levels and DME use at home    Extremity/Trunk Assessment Upper Extremity Assessment Upper Extremity Assessment: Generalized weakness   Lower Extremity Assessment Lower Extremity Assessment: Defer to PT evaluation        Vision   Vision Assessment?: No apparent visual deficits   Perception     Praxis      Cognition Arousal/Alertness: Awake/alert Behavior During Therapy: Flat affect Overall Cognitive Status: Impaired/Different from baseline Area of Impairment: Orientation, Memory, Safety/judgement, Problem solving  Orientation Level: Disoriented to, Time   Memory: Decreased short-term memory Following Commands: Follows one step commands consistently Safety/Judgement: Decreased awareness of safety, Decreased awareness of deficits Awareness: Intellectual Problem Solving: Slow processing General Comments: very flat affect, often reporting need to go home and per  nursing, pt has reported that she wants to die, etc. follows one step commands consistently        Exercises      Shoulder Instructions       General Comments      Pertinent Vitals/ Pain       Pain Assessment Pain Assessment: No/denies pain  Home Living                                          Prior Functioning/Environment              Frequency  Min 2X/week        Progress Toward Goals  OT Goals(current goals can now be found in the care plan section)  Progress towards OT goals: Progressing toward goals  Acute Rehab OT Goals Patient Stated Goal: go home OT Goal Formulation: With patient Time For Goal Achievement: 03/14/23 Potential to Achieve Goals: Good ADL Goals Pt Will Perform Grooming: with supervision;standing Pt Will Perform Lower Body Dressing: with supervision;sit to/from stand Pt Will Transfer to Toilet: with supervision;ambulating Additional ADL Goal #1: Pt will tolerate at least 8x minutes of OOB acitivty to demonstrate increased activity tolerance Additional ADL Goal #2: Pt will indep recall at least 3 energy conservation techniques  Plan Discharge plan remains appropriate    Co-evaluation                 AM-PAC OT "6 Clicks" Daily Activity     Outcome Measure   Help from another person eating meals?: None Help from another person taking care of personal grooming?: A Little Help from another person toileting, which includes using toliet, bedpan, or urinal?: A Little Help from another person bathing (including washing, rinsing, drying)?: A Little Help from another person to put on and taking off regular upper body clothing?: A Little Help from another person to put on and taking off regular lower body clothing?: A Little 6 Click Score: 19    End of Session Equipment Utilized During Treatment: Rolling walker (2 wheels);Gait belt  OT Visit Diagnosis: Unsteadiness on feet (R26.81);Other abnormalities of gait and  mobility (R26.89);Muscle weakness (generalized) (M62.81)   Activity Tolerance Patient tolerated treatment well   Patient Left in chair;with call bell/phone within reach;with chair alarm set;with nursing/sitter in room   Nurse Communication Mobility status        Time: [id number] OT Time Calculation (min): 36 min  Charges: OT General Charges $OT Visit: 1 Visit OT Treatments $Self Care/Home Management : 8-22 mins $Therapeutic Activity: 8-22 mins  Malachy Chamber, OTR/L Acute Rehab Services Office: [place] 03/05/2023, 9:37 AM

## 2023-03-06 LAB — CBC
HCT: 33.1 % — ABNORMAL LOW (ref 36.0–46.0)
Hemoglobin: 9.5 g/dL — ABNORMAL LOW (ref 12.0–15.0)
MCH: 23.4 pg — ABNORMAL LOW (ref 26.0–34.0)
MCHC: 28.7 g/dL — ABNORMAL LOW (ref 30.0–36.0)
MCV: 81.5 fL (ref 80.0–100.0)
Platelets: 200 10*3/uL (ref 150–400)
RBC: 4.06 MIL/uL (ref 3.87–5.11)
RDW: 26.8 % — ABNORMAL HIGH (ref 11.5–15.5)
WBC: 7.6 10*3/uL (ref 4.0–10.5)
nRBC: 0 % (ref 0.0–0.2)

## 2023-03-06 LAB — MAGNESIUM: Magnesium: 2.2 mg/dL (ref 1.7–2.4)

## 2023-03-06 LAB — COOXEMETRY PANEL
Carboxyhemoglobin: 2.1 % — ABNORMAL HIGH (ref 0.5–1.5)
Methemoglobin: <0.7 % (ref 0.0–1.5)
O2 Saturation: 74.5 %
Total hemoglobin: 9.8 g/dL — ABNORMAL LOW (ref 12.0–16.0)

## 2023-03-06 LAB — GLUCOSE, CAPILLARY
Glucose-Capillary: 78 mg/dL (ref 70–99)
Glucose-Capillary: 83 mg/dL (ref 70–99)
Glucose-Capillary: 175 mg/dL — ABNORMAL HIGH (ref 70–99)
Glucose-Capillary: 97 mg/dL (ref 70–99)

## 2023-03-06 LAB — BASIC METABOLIC PANEL
Anion gap: 8 (ref 5–15)
BUN: 34 mg/dL — ABNORMAL HIGH (ref 8–23)
CO2: 24 mmol/L (ref 22–32)
Calcium: 8.9 mg/dL (ref 8.9–10.3)
Chloride: 103 mmol/L (ref 98–111)
Creatinine, Ser: 0.84 mg/dL (ref 0.44–1.00)
GFR, Estimated: >60 mL/min (ref >60)
Glucose, Bld: 94 mg/dL (ref 70–99)
Potassium: 4.3 mmol/L (ref 3.5–5.1)
Sodium: 135 mmol/L (ref 135–145)

## 2023-03-06 NOTE — Progress Notes (Signed)
Speech Language Pathology Treatment: Dysphagia  Patient Details Name: Stacy Jenkins MRN: [id number] DOB: Nov 29, 1961 Today's Date: 03/06/2023 Time: [id number] SLP Time Calculation (min) (ACUTE ONLY): 14 min  Assessment / Plan / Recommendation Clinical Impression  Patient seen at bedside for advanced liquid trials. She was alert and upright upon arrival to room, self feeding pills whole with nectar thick liquids. No overt s/s of aspiration noted with this consistency po. Provided trials of thin liquid to assess for potential to advance diet. Patient continues to present with immediate cough post swallow with thin liquids both with and without straw, even when consumed via small sip in approximately 50% of trials. Note that during MBS this was indicative of aspiration that patient was unable to clear despite cough response. It has now been one week since initial MBS however. Recommend repeat instrumental testing to determine if there has been enough improvement in either strength cough or swallowing function to allow advancement to thin liquids with compensatory strategies. Plan for [facility] 3/13.    HPI HPI: Brynda Dewater is a 62 y.o. 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  She was diagnosed with acute respiratory, acute on chronic systolic heart failure and afib with RVR.  Chest xray was showing moderate pulmonary edema with small right pleural effusion.      SLP Plan  MBS      Recommendations for follow up therapy are one component of a multi-disciplinary discharge planning process, led by the attending physician.  Recommendations may be updated based on patient status, additional functional criteria and insurance authorization.    Recommendations   Diet recommendations: Regular;Nectar-thick liquid Liquids provided via: Cup;Straw Medication Administration: Whole meds with liquid Supervision: Patient able to self feed Compensations: Slow rate;Small sips/bites Postural Changes and/or Swallow Maneuvers: Seated upright 90 degrees                Oral Care Recommendations: Oral care BID SLP Visit Diagnosis: Dysphagia, oropharyngeal phase (R13.12) Plan: MBS         TRUE Garciamartinez MA, CCC-SLP   Luigi Stuckey Meryl  03/06/2023, 9:56 AM

## 2023-03-06 NOTE — Progress Notes (Addendum)
Advanced Heart Failure Rounding Note  PCP-Cardiologist: [name], MD   Subjective:    3/4 S/P RHC RA 15, PA 60/31 (40)PCWP 30>50, Fick CO/CI 5.3 CI 2.8 . Started on Norepi 5 mcg and diuresed with IV lasix.  3/5 Diuresed with IV lasix + Diuril. Swallow study with recommendations for mildly thick liquids.  Back in SR.  3/6 K 2. Diuretics held. Palliative Care  3/7 Long run of PMVT, spontaneously broke. 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 EF 30-35%, RV mod reduced, severe TR  3/9 Norepi stopped.    Co-ox 75% off pressors inotropes. CVP remains low ~1. Maintaining NSR.   Denies dyspnea. Only current complaint is that she feels "cold".   Objective:   Weight Range: 67.8 kg Body mass index is 23.41 kg/m.   Vital Signs:   Temp:  [97.2 F (36.2 C)-98 F (36.7 C)] 97.8 F (36.6 C) (03/12 0614) Pulse Rate:  [61-86] 73 (03/12 0700) Resp:  [12-28] 26 (03/12 0700) BP: (91-120)/(59-83) 109/73 (03/12 0700) SpO2:  [90 %-99 %] 96 % (03/12 0700) Last BM Date : 03/05/23  Weight change: Filed Weights   03/03/23 0500 03/04/23 0153 03/05/23 0500  Weight: 70.2 kg 69.2 kg 67.8 kg    Intake/Output:   Intake/Output Summary (Last 24 hours) at 03/06/2023 0713 Last data filed at 03/06/2023 0400 Gross per 24 hour  Intake 64.38 ml  Output 1307 ml  Net -1242.62 ml      Physical Exam  CVP 1 General:  fatigued appearing. No respiratory difficulty HEENT: normal Neck: supple. no JVD. Carotids 2+ bilat; no bruits. No lymphadenopathy or thyromegaly appreciated. Cor: PMI nondisplaced. Regular rate & rhythm. 2/6 HSM, loudest at apex  Lungs: clear Abdomen: soft, nontender, nondistended. No hepatosplenomegaly. No bruits or masses. Good bowel sounds. Extremities: no cyanosis, clubbing, rash, edema Neuro: alert & oriented x 3, cranial nerves grossly intact. moves all 4 extremities w/o  difficulty. Affect pleasant.   Telemetry   NSR 80s personally checked.   EKG    N/A  Labs    CBC Recent Labs    03/05/23 0515 03/06/23 0413  WBC 7.8 7.6  HGB 9.5* 9.5*  HCT 33.1* 33.1*  MCV 80.3 81.5  PLT 220 [id number]   Basic Metabolic Panel Recent Labs    03/05/23 0515 03/06/23 0413  NA 133* 135  K 3.7 4.3  CL 96* 103  CO2 25 24  GLUCOSE 89 94  BUN 34* 34*  CREATININE 1.09* 0.84  CALCIUM 9.1 8.9  MG 2.4 2.2   Liver Function Tests No results for input(s): "AST", "ALT", "ALKPHOS", "BILITOT", "PROT", "ALBUMIN" in the last 72 hours.  No results for input(s): "LIPASE", "AMYLASE" in the last 72 hours. Cardiac Enzymes No results for input(s): "CKTOTAL", "CKMB", "CKMBINDEX", "TROPONINI" in the last 72 hours.  BNP: BNP (last 3 results) Recent Labs    02/22/23 1534  BNP 876.3*    ProBNP (last 3 results) Recent Labs    02/21/23 1203  PROBNP 4,604*     D-Dimer No results for input(s): "DDIMER" in the last 72 hours. Hemoglobin A1C No results for input(s): "HGBA1C" in the last 72 hours. Fasting Lipid Panel No results for input(s): "CHOL", "HDL", "LDLCALC", "TRIG", "CHOLHDL", "LDLDIRECT" in the last 72 hours. Thyroid Function Tests No results for input(s): "TSH", "T4TOTAL", "T3FREE", "THYROIDAB" in the last 72 hours.  Invalid input(s): "FREET3"  Other  results:   Imaging    No results found.   Medications:     Scheduled Medications:  amiodarone  400 mg Oral BID   Followed by   Derrill Memo ON 03/10/2023] amiodarone  200 mg Oral Daily   apixaban  5 mg Oral BID   Chlorhexidine Gluconate Cloth  6 each Topical Daily   dapagliflozin propanediol  10 mg Oral Daily   digoxin  0.125 mg Oral Daily   docusate sodium  100 mg Oral BID   insulin aspart  0-15 Units Subcutaneous TID WC   insulin aspart  0-5 Units Subcutaneous QHS   midodrine  10 mg Oral TID WC   mouth rinse  15 mL Mouth Rinse 4 times per day   polyethylene glycol  17 g Oral Daily   rosuvastatin   5 mg Oral Daily   sodium chloride flush  10-40 mL Intracatheter Q12H   sodium chloride flush  3 mL Intravenous Q12H   sodium chloride flush  3 mL Intravenous Q12H   sodium chloride flush  3 mL Intravenous Q12H   spironolactone  50 mg Oral Daily    Infusions:  sodium chloride 250 mL (02/28/23 0500)   sodium chloride 10 mL/hr at 03/05/23 1800   sodium chloride      PRN Medications: sodium chloride, sodium chloride, acetaminophen, LORazepam, ondansetron (ZOFRAN) IV, mouth rinse, mouth rinse, sodium chloride flush, sodium chloride flush, sodium chloride flush, traZODone    Patient Profile   Stacy Jenkins is a 62 year old with a history of CVA, hypertension, hyperlipidemia, minimal CAD, patent foramen ovale, chronic systolic heart failure, 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    Admitted 02/22/2023 for the evaluation of dyspnea.   Assessment/Plan     1.  Shock --> Suspect Cardiogenic -Most recent ECHO EF 45-50% at [facility]. Will need to repeat. Concerned EF will be worse due to ongoing A fib RVR.  Persistent hypotension with elevated lactic acid. CO2 27 SCAI C. Advanced Heart Failure Team Consulted  Lactic acid clearing 3.4>1.4  RHC elevated filling pressures CO 5.3 CI 2.8  - off pressors.  CO-OX 75% -CVP 1. Hold lasix.  -Continue spiro 50 mg daily -Continue midodrine 10 mg tid.  -Renal function stable.   2. A fib RVR  - Maintaining SR.  - Continue PO amiodarone taper, on 400 mg bid   - ON eliquis  5 mg twice a day.    3. Acute Hypoxic Respiratory Failure ,  - improving w/ diuresis  - CCM following.    4. MR  - TEE 3/7 severe functional MR with noncoaptation of the mitral valve leaflets.  No clear flail or prolapse. Severe TR, +PFO. Moderately reduced biventricular function - d/w structural heart team. Anatomy likely not favorable for clip. Not candidate for surgery    5.  Iron Deficiency  Anemia  -Recent GI bleed 01/2023  -2024 EGD/Colonoscopy with diverticulosis.  -Hgb stable 9.5  -No obvious source.  -Fe 18, Tsat 4. Treated w/ IV Fe     6. CAD 2019 Non obstructive CAD-->10% left main, <25% mid LAD, <25% RCA lesion.  - No chest pain.    7. H/O Bioprosthetic AVR 2019    8. DMII  -Hgb A1C 8 -Continue SSI    9. H/O CVA   10. Hypokalemia - K repleted.   11. NSVT/ PMVT  - On oral amio taper.  - Keep K > 4.0 and Mg > 2.0  51. GOC - She told staff and me that she wants to die.  - Palliative Care appreciated. Will ask to revisit today to discuss hospice   13. Depression - Psychiatry consulted. Refused antidepressants.    Length of Stay: [address] PA-C  03/06/2023, 7:13 AM  Advanced Heart Failure Team Pager [phone] (M-F; [place])  Please contact [facility] for night-coverage after hours (5p -7a ) and weekends on [email or web address]  Patient seen with PA, agree with the above note.   Stable this morning with co-ox 75% and CVP 1.  She says she is "cold."  Continues to say that she wants to go home to die.   Walked with PT yesterday.   General: NAD Neck: No JVD, no thyromegaly or thyroid nodule.  Lungs: Clear to auscultation bilaterally with normal respiratory effort. CV: Nondisplaced PMI.  Heart regular S1/S2, no S3/S4, 3/6 HSM apex.  No peripheral edema.   Abdomen: Soft, nontender, no hepatosplenomegaly, no distention.  Skin: Intact without lesions or rashes.  Neurologic: Alert and oriented x 3.  Psych: Normal affect. Extremities: No clubbing or cyanosis.  HEENT: Normal.   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  Markedly dilated LV with functional MR due to poorly coapting leaflets, TEE was reviewed with structural heart team last week and she was not thought to be a candidate for Mitraclip.  Not candidate for open surgery. Volume status significantly improved with CVP still 1.  - Hold diuretics again today with low CVP.   - Continue midodrine 10 tid for now, SBP 90s-100s. - Continue digoxin 0.125 - Continue spironolactone 50 daily.  - Continue Farxiga 10 daily.  - No interventional option for MV, will do the best we can with meds.    She is in NSR on amiodarone and apixaban.    Patient states today that she wants to go home "to die."  She was saying similar things last week as well. She is still listed as full code.  Waiting for palliative care to come by, did not see yesterday.  I think hospice would be reasonable, she has CHF from markedly severe MR and it does not appear that we have an interventional option.   Ok for progressive unit, plan ultimate home probably with hospice.   Loralie Champagne 03/06/2023 7:37 AM

## 2023-03-07 ENCOUNTER — Inpatient Hospital Stay (HOSPITAL_COMMUNITY): Payer: PPO

## 2023-03-07 DIAGNOSIS — Z66 Do not resuscitate: Secondary | ICD-10-CM | POA: Diagnosis not present

## 2023-03-07 DIAGNOSIS — Z515 Encounter for palliative care: Secondary | ICD-10-CM | POA: Diagnosis not present

## 2023-03-07 DIAGNOSIS — I5023 Acute on chronic systolic (congestive) heart failure: Secondary | ICD-10-CM | POA: Diagnosis not present

## 2023-03-07 DIAGNOSIS — Z7189 Other specified counseling: Secondary | ICD-10-CM | POA: Diagnosis not present

## 2023-03-07 LAB — CBC
HCT: 31.5 % — ABNORMAL LOW (ref 36.0–46.0)
Hemoglobin: 9.1 g/dL — ABNORMAL LOW (ref 12.0–15.0)
MCH: 23.6 pg — ABNORMAL LOW (ref 26.0–34.0)
MCHC: 28.9 g/dL — ABNORMAL LOW (ref 30.0–36.0)
MCV: 81.8 fL (ref 80.0–100.0)
Platelets: 194 10*3/uL (ref 150–400)
RBC: 3.85 MIL/uL — ABNORMAL LOW (ref 3.87–5.11)
RDW: 27.1 % — ABNORMAL HIGH (ref 11.5–15.5)
WBC: 7.2 10*3/uL (ref 4.0–10.5)
nRBC: 0 % (ref 0.0–0.2)

## 2023-03-07 LAB — BASIC METABOLIC PANEL
Anion gap: 7 (ref 5–15)
BUN: 27 mg/dL — ABNORMAL HIGH (ref 8–23)
CO2: 24 mmol/L (ref 22–32)
Calcium: 8.6 mg/dL — ABNORMAL LOW (ref 8.9–10.3)
Chloride: 103 mmol/L (ref 98–111)
Creatinine, Ser: 0.74 mg/dL (ref 0.44–1.00)
GFR, Estimated: >60 mL/min (ref >60)
Glucose, Bld: 91 mg/dL (ref 70–99)
Potassium: 4.1 mmol/L (ref 3.5–5.1)
Sodium: 134 mmol/L — ABNORMAL LOW (ref 135–145)

## 2023-03-07 LAB — GLUCOSE, CAPILLARY
Glucose-Capillary: 89 mg/dL (ref 70–99)
Glucose-Capillary: 170 mg/dL — ABNORMAL HIGH (ref 70–99)
Glucose-Capillary: 117 mg/dL — ABNORMAL HIGH (ref 70–99)

## 2023-03-07 LAB — COOXEMETRY PANEL
Carboxyhemoglobin: 2.1 % — ABNORMAL HIGH (ref 0.5–1.5)
Methemoglobin: <0.7 % (ref 0.0–1.5)
O2 Saturation: 69.6 %
Total hemoglobin: 9.4 g/dL — ABNORMAL LOW (ref 12.0–16.0)

## 2023-03-07 LAB — MAGNESIUM: Magnesium: 2.1 mg/dL (ref 1.7–2.4)

## 2023-03-07 MED ORDER — FUROSEMIDE 40 MG PO TABS
40.0000 mg | ORAL_TABLET | Freq: Every day | ORAL | Status: DC
  Administered 2023-03-07 – 2023-03-08 (×2): 40 mg via ORAL
  Filled 2023-03-07 (×3): qty 1

## 2023-03-07 MED ORDER — POTASSIUM CHLORIDE CRYS ER 20 MEQ PO TBCR
20.0000 meq | EXTENDED_RELEASE_TABLET | Freq: Every day | ORAL | Status: DC
  Administered 2023-03-07 – 2023-03-08 (×2): 20 meq via ORAL
  Filled 2023-03-07 (×3): qty 1

## 2023-03-07 NOTE — Progress Notes (Signed)
Nutrition Follow-up  DOCUMENTATION CODES:   Non-severe (moderate) malnutrition in context of chronic illness  INTERVENTION:   - Agree with Regular liberalized diet order to provide maximum food options at meal times given malnutrition and variable PO intake  - Continue Magic Cup BID with lunch and dinner meals, each supplement provides 290 kcal and 9 grams of protein  - Continue Mighty Shake at breakfast, each 6 ounce supplement provides 330 kcal and 9 grams of protein  - Add Ensure Enlive po BID between meals, each supplement provides 350 kcal and 20 grams of protein  - Encourage PO intake  NUTRITION DIAGNOSIS:   Moderate Malnutrition related to chronic illness as evidenced by edema, mild muscle depletion, mild fat depletion.  Ongoing, being addressed via oral nutrition supplements  GOAL:   Patient will meet greater than or equal to 90% of their needs  Progressing  MONITOR:   PO intake, Supplement acceptance, Labs, Weight trends  REASON FOR ASSESSMENT:   Consult Assessment of nutrition requirement/status  ASSESSMENT:   62 year old female admitted with acute on chronic heart failure, cardiogenic shock. PMH includes stroke, HTN.  03/07 - s/p TEE showing severe functional central MR, EF 30-35%, RV mod reduced, severe TR  Attempted to speak with pt in room. Pt unavailable at time of RD visit. Per HF Team note, Palliative Medicine to see pt again today to discuss hospice options.  PO intake has been variable, ranging between 25-100% of meals completed. Pt with thickened oral nutrition supplements ordered with meals. Diet just advanced today to Regular with thin liquids. Given variable PO intake, RD to order Ensure Enlive BID between meals.  Weight down 10 kg this admission. Pt is net negative 16.1 L since admit.  Admit weight: 79.3 kg Current weight: 69.9 kg  Meal Completion: 25-100%  Medications reviewed and include: farxiga, colace, lasix, SSI, miralax, klor-con  20 mEq daily, spironolactone  Labs reviewed: sodium 134, BUN 27, hemoglobin 9.1 CBG's: 83-175 x 24 hours  UOP: 1025 ml + 1 unmeasured occurrence x 24 hours I/O's: -16.1 L since admit  Diet Order:   Diet Order             Diet regular Room service appropriate? Yes; Fluid consistency: Thin  Diet effective now                   EDUCATION NEEDS:   Not appropriate for education at this time  Skin:  Skin Assessment: Reviewed RN Assessment  Last BM:  03/07/23 medium type 4  Height:   Ht Readings from Last 1 Encounters:  02/26/23 '5\' 7"'$  (1.702 m)    Weight:   Wt Readings from Last 1 Encounters:  03/07/23 69.9 kg    BMI:  Body mass index is 24.14 kg/m.  Estimated Nutritional Needs:   Kcal:  1600-1800  Protein:  75-85 grams  Fluid:  1.6 L    Gustavus Bryant, MS, RD, LDN Inpatient Clinical Dietitian Please see AMiON for contact information.

## 2023-03-07 NOTE — Progress Notes (Signed)
Physical Therapy Treatment Patient Details Name: Stacy Jenkins MRN: [id number] DOB: 21-Jun-1961 Today's Date: 03/07/2023   History of Present Illness 62 y/o female presented 2/29 for elective TEE. Pt with SOB and tachycardia, TEE cancelled due to A-fib with RVR and hypoxia. Admitted with acute respiratory failure secondary to volume overload, heart failure exacerbation and cardiogenic shock. PMHx: aortic aneurysm repair, CVA, AVR, HFrEF, A-fib, CHF, HTN, DM.    PT Comments    Pt napping in chair on entry, wakes easily and is agreeable to walking in hallway, reporting "there is nothing else to do." Pt requiring min guard for transfers and ambulation with RW. Experiences 2/4 DoE at end of ambulation, SpO2 >89%O2 throughout ambulation. Pt continues to vacillate between "I'm going home tomorrow." And "he said I was going to die." Despite attempts to redirect conversation pt returns to those two subjects. D/c plan remains appropriate.     Recommendations for follow up therapy are one component of a multi-disciplinary discharge planning process, led by the attending physician.  Recommendations may be updated based on patient status, additional functional criteria and insurance authorization.  Follow Up Recommendations  Home health PT     Assistance Recommended at Discharge Frequent or constant Supervision/Assistance  Patient can return home with the following A little help with walking and/or transfers;A little help with bathing/dressing/bathroom;Help with stairs or ramp for entrance;Assist for transportation;Assistance with cooking/housework   Equipment Recommendations  Rolling walker (2 wheels);BSC/3in1       Precautions / Restrictions Precautions Precautions: Fall Precaution Comments: watch O2 Restrictions Weight Bearing Restrictions: No     Mobility  Bed Mobility               General bed mobility comments: in chair on entry    Transfers Overall transfer level: Needs  assistance Equipment used: Rolling walker (2 wheels) Transfers: Sit to/from Stand Sit to Stand: Min guard           General transfer comment: min guard for safety, increased time and effort to come to standing    Ambulation/Gait Ambulation/Gait assistance: Min guard Gait Distance (Feet): 200 Feet Assistive device: Rolling walker (2 wheels) Gait Pattern/deviations: Step-through pattern Gait velocity: reduced Gait velocity interpretation: <1.8 ft/sec, indicate of risk for recurrent falls   General Gait Details: slow, step through gait, arches on both feet appear to have fallen and pt with decreased foot clearance, 2/4 DoE at end of ambulation , recovers quickly in seated         Balance Overall balance assessment: Needs assistance Sitting-balance support: No upper extremity supported, Feet supported Sitting balance-Leahy Scale: Good     Standing balance support: Single extremity supported, Reliant on assistive device for balance Standing balance-Leahy Scale: Poor Standing balance comment: RW for gait                            Cognition Arousal/Alertness: Awake/alert Behavior During Therapy: Flat affect Overall Cognitive Status: Impaired/Different from baseline Area of Impairment: Orientation, Memory, Safety/judgement, Problem solving                 Orientation Level: Disoriented to, Time   Memory: Decreased short-term memory Following Commands: Follows one step commands consistently Safety/Judgement: Decreased awareness of safety, Decreased awareness of deficits Awareness: Intellectual Problem Solving: Slow processing General Comments: very flat affect, follows one step commands consistently, ongoing dialog about going home and wanting to die,        Exercises Other Exercises Other  Exercises: 3x sit<>stand    General Comments General comments (skin integrity, edema, etc.): VSS on RA      Pertinent Vitals/Pain Pain Assessment Pain  Assessment: No/denies pain     PT Goals (current goals can now be found in the care plan section) Acute Rehab PT Goals Patient Stated Goal: go home PT Goal Formulation: With patient Time For Goal Achievement: 03/13/23 Potential to Achieve Goals: Good Progress towards PT goals: Progressing toward goals    Frequency    Min 3X/week      PT Plan Current plan remains appropriate       AM-PAC PT "6 Clicks" Mobility   Outcome Measure  Help needed turning from your back to your side while in a flat bed without using bedrails?: A Little Help needed moving from lying on your back to sitting on the side of a flat bed without using bedrails?: A Little Help needed moving to and from a bed to a chair (including a wheelchair)?: A Little Help needed standing up from a chair using your arms (e.g., wheelchair or bedside chair)?: A Little Help needed to walk in hospital room?: A Little Help needed climbing 3-5 steps with a railing? : A Lot 6 Click Score: 17    End of Session   Activity Tolerance: Patient tolerated treatment well Patient left: in chair;with call bell/phone within reach;with chair alarm set;with family/visitor present Nurse Communication: Mobility status PT Visit Diagnosis: Difficulty in walking, not elsewhere classified (R26.2);Muscle weakness (generalized) (M62.81);Unsteadiness on feet (R26.81)     Time: [id number] PT Time Calculation (min) (ACUTE ONLY): 25 min  Charges:  $Gait Training: 8-22 mins $Therapeutic Exercise: 8-22 mins                     Dallys Nowakowski B. Migdalia Dk PT, DPT Acute Rehabilitation Services Please use secure chat or  Call Office [phone]    [place] 03/07/2023, 3:28 PM

## 2023-03-07 NOTE — Progress Notes (Cosign Needed)
   Durable Medical Equipment (From admission, onward)        Start     Ordered  03/07/23 1556  For home use only DME standard manual wheelchair with seat cushion  Once      Comments: Patient suffers from chronic systolic heart failure which impairs their ability to perform daily activities like bathing, dressing, feeding, grooming, and toileting in the home.  A walker will not resolve issue with performing activities of daily living. A wheelchair will allow patient to safely perform daily activities. Patient can safely propel the wheelchair in the home or has a caregiver who can provide assistance. Length of need lifetime  Accessories: elevating leg rests (ELRs), wheel locks, extensions and anti-tippers.  03/07/23 1558  03/07/23 1541  For home use only DME wheelchair cushion (seat and back)  Once       03/07/23 1540

## 2023-03-07 NOTE — Progress Notes (Addendum)
Advanced Heart Failure Rounding Note  PCP-Cardiologist: [name], MD   Subjective:    3/4 S/P RHC RA 15, PA 60/31 (40)PCWP 30>50, Fick CO/CI 5.3 CI 2.8 . Started on Norepi 5 mcg and diuresed with IV lasix.  3/5 Diuresed with IV lasix + Diuril. Swallow study with recommendations for mildly thick liquids.  Back in SR.  3/6 K 2. Diuretics held. Palliative Care  3/7 Long run of PMVT, spontaneously broke. 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 EF 30-35%, RV mod reduced, severe TR  3/9 Norepi stopped.    Co-ox 70% off pressors inotropes. Wt trending back up. CVP 8. BP remains soft, on midodrine 10 tid. Maintaining NSR.   OOB. Sitting up in chair. No current resting dyspnea. No CP.    Objective:   Weight Range: 69.9 kg Body mass index is 24.14 kg/m.   Vital Signs:   Temp:  [97.3 F (36.3 C)-98.4 F (36.9 C)] 97.7 F (36.5 C) (03/13 0731) Pulse Rate:  [74-95] 78 (03/13 0731) Resp:  [15-30] 19 (03/13 0731) BP: (98-120)/(61-83) 98/69 (03/13 0731) SpO2:  [92 %-97 %] 94 % (03/13 0731) Weight:  [69.9 kg] 69.9 kg (03/13 0436) Last BM Date : 03/06/23  Weight change: Filed Weights   03/04/23 0153 03/05/23 0500 03/07/23 0436  Weight: 69.2 kg 67.8 kg 69.9 kg    Intake/Output:   Intake/Output Summary (Last 24 hours) at 03/07/2023 0814 Last data filed at 03/07/2023 0430 Gross per 24 hour  Intake 120 ml  Output 1025 ml  Net -905 ml      Physical Exam   CVP 8-9 General:  weak/fatigued appearing. No respiratory difficulty HEENT: normal Neck: supple. JVD 8cm . Carotids 2+ bilat; no bruits. No lymphadenopathy or thyromegaly appreciated. Cor: PMI nondisplaced. Regular rate & rhythm. + 3/6 HSM  Lungs: clear Abdomen: soft, nontender, nondistended. No hepatosplenomegaly. No bruits or masses. Good bowel sounds. Extremities: no cyanosis, clubbing, rash, edema + RUE PICC  Neuro:  alert & oriented x 3, cranial nerves grossly intact. moves all 4 extremities w/o difficulty. Affect pleasant.   Telemetry   NSR 80s personally checked.   EKG    N/A  Labs    CBC Recent Labs    03/06/23 0413 03/07/23 0255  WBC 7.6 7.2  HGB 9.5* 9.1*  HCT 33.1* 31.5*  MCV 81.5 81.8  PLT 200 [id number]   Basic Metabolic Panel Recent Labs    03/06/23 0413 03/07/23 0255  NA 135 134*  K 4.3 4.1  CL 103 103  CO2 24 24  GLUCOSE 94 91  BUN 34* 27*  CREATININE 0.84 0.74  CALCIUM 8.9 8.6*  MG 2.2 2.1   Liver Function Tests No results for input(s): "AST", "ALT", "ALKPHOS", "BILITOT", "PROT", "ALBUMIN" in the last 72 hours.  No results for input(s): "LIPASE", "AMYLASE" in the last 72 hours. Cardiac Enzymes No results for input(s): "CKTOTAL", "CKMB", "CKMBINDEX", "TROPONINI" in the last 72 hours.  BNP: BNP (last 3 results) Recent Labs    02/22/23 1534  BNP 876.3*    ProBNP (last 3 results) Recent Labs    02/21/23 1203  PROBNP 4,604*     D-Dimer No results for input(s): "DDIMER" in the last 72 hours. Hemoglobin A1C No results for input(s): "HGBA1C" in the last 72 hours. Fasting Lipid Panel No results for input(s): "CHOL", "HDL", "LDLCALC", "TRIG", "CHOLHDL", "LDLDIRECT" in the last 72  hours. Thyroid Function Tests No results for input(s): "TSH", "T4TOTAL", "T3FREE", "THYROIDAB" in the last 72 hours.  Invalid input(s): "FREET3"  Other results:   Imaging    No results found.   Medications:     Scheduled Medications:  amiodarone  400 mg Oral BID   Followed by   Derrill Memo ON 03/10/2023] amiodarone  200 mg Oral Daily   apixaban  5 mg Oral BID   Chlorhexidine Gluconate Cloth  6 each Topical Daily   dapagliflozin propanediol  10 mg Oral Daily   digoxin  0.125 mg Oral Daily   docusate sodium  100 mg Oral BID   insulin aspart  0-15 Units Subcutaneous TID WC   insulin aspart  0-5 Units Subcutaneous QHS   midodrine  10 mg Oral TID WC   mouth rinse  15 mL  Mouth Rinse 4 times per day   polyethylene glycol  17 g Oral Daily   rosuvastatin  5 mg Oral Daily   sodium chloride flush  10-40 mL Intracatheter Q12H   sodium chloride flush  3 mL Intravenous Q12H   sodium chloride flush  3 mL Intravenous Q12H   sodium chloride flush  3 mL Intravenous Q12H   spironolactone  50 mg Oral Daily    Infusions:  sodium chloride 250 mL (02/28/23 0500)   sodium chloride 10 mL/hr at 03/06/23 1300   sodium chloride      PRN Medications: sodium chloride, sodium chloride, acetaminophen, LORazepam, ondansetron (ZOFRAN) IV, mouth rinse, mouth rinse, sodium chloride flush, sodium chloride flush, sodium chloride flush, traZODone    Patient Profile   Mrs Beilfuss is a 62 year old with a history of CVA, hypertension, hyperlipidemia, minimal CAD, patent foramen ovale, chronic systolic heart failure, 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    Admitted 02/22/2023 for the evaluation of dyspnea.   Assessment/Plan     1.  Shock --> Suspect Cardiogenic -Most recent ECHO EF 45-50% at [facility]. Will need to repeat. Concerned EF will be worse due to ongoing A fib RVR.  Persistent hypotension with elevated lactic acid. CO2 27 SCAI C. Advanced Heart Failure Team Consulted  Lactic acid clearing 3.4>1.4  RHC elevated filling pressures CO 5.3 CI 2.8  - TEE 3/7 with EF 30-35%, moderate RV dysfunction, severe MR - off pressors.  CO-OX 70% -CVP 8. Start PO Lasix 40 mg daily  -Continue spiro 50 mg daily -Continue midodrine 10 mg tid.  -Renal function stable.   2. A fib RVR  - Maintaining SR.  - Continue PO amiodarone taper, on 400 mg bid   - ON eliquis  5 mg twice a day.    3. Acute Hypoxic Respiratory Failure ,  - improved w/ diuresis     4. MR  - TEE 3/7 severe functional MR with noncoaptation of the mitral valve leaflets.  No clear flail or prolapse. Severe TR, +PFO. Moderately reduced  biventricular function - d/w structural heart team. Anatomy likely not favorable for clip. Not candidate for surgery    5.  Iron Deficiency Anemia  -Recent GI bleed 01/2023  -2024 EGD/Colonoscopy with diverticulosis.  -Hgb stable 9.1  -No obvious source.  -Fe 18, Tsat 4. Treated w/ IV Fe     6. CAD 2019 Non obstructive CAD-->10% left main, <25% mid LAD, <25% RCA lesion.  - No chest pain.    7. H/O Bioprosthetic AVR 2019    8. DMII  -Hgb A1C 8 -Continue  SSI    9. H/O CVA   10. Hypokalemia - K repleted.   11. NSVT/ PMVT  - On oral amio taper.  - Keep K > 4.0 and Mg > 2.0    12. [place] - She told staff and me that she wants to die.  - Palliative Care appreciated. Will ask to revisit today to discuss hospice   13. Depression - Psychiatry consulted. Refused antidepressants.   D/w palliative care team again today. They will see today to discuss hospice options.    Length of Stay: [address] PA-C  03/07/2023, 8:14 AM  Advanced Heart Failure Team Pager [phone] (M-F; 7a - 5p)  Please contact [facility] for night-coverage after hours (5p -7a ) and weekends on [email or web address]  Patient seen with PA, agree with the above note.   SBP 90s on midodrine 10 tid.  She remains in NSR on amiodarone.  CVP trending up to 8 today. Co-ox 70%.    General: NAD Neck: JVP 8 cm, no thyromegaly or thyroid nodule.  Lungs: Clear to auscultation bilaterally with normal respiratory effort. CV: Nondisplaced PMI.  Heart regular S1/S2, no S3/S4, 3/6 HSM apex.  No peripheral edema.   Abdomen: Soft, nontender, no hepatosplenomegaly, no distention.  Skin: Intact without lesions or rashes.  Neurologic: Alert and oriented x 3.  Psych: Normal affect. Extremities: No clubbing or cyanosis.  HEENT: Normal.   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  Markedly dilated LV with functional MR due to poorly coapting leaflets, TEE was reviewed with structural heart team last week  and she was not thought to be a candidate for Mitraclip.  Not candidate for open surgery. CVP 8 today with SBP 90s and co-ox 70%.   - Start Lasix 40 mg po daily.  - Continue midodrine 10 tid for now, SBP 90s-100s. - Continue digoxin 0.125 - Continue spironolactone 50 daily.  - Continue Farxiga 10 daily.  - No interventional option for MV, will do the best we can with meds.    She is in NSR on amiodarone and apixaban.    Patient seen by palliative care service, made DNR and will have home palliative services but not hospice.  Hopefully we can get her home tomorrow with home health/PT.   Loralie Champagne 03/07/2023 2:23 PM

## 2023-03-07 NOTE — Progress Notes (Signed)
Palliative:  HPI: 62 y.o. 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  Patient with shock, cardiogenic suspected.  Remains on norepinephrine drip.  Diuretics held due to severe hypokalemia.  Also struggling with some dysphagia.  Requiring high flow nasal cannula, BiPAP at night.  Per chart review patient expressing to different staff members she does not want to continue with aggressive medical care.  PMT consulted to discuss goals of care with patient.    I met today at Sala's bedside with husband Herbie [place] and son Darnelle Maffucci was present via telephone. I reviewed with them Stacy Jenkins's declining heart condition with concern for declining ejection fraction in the setting of valvular disease without options for surgical intervention or repair. Unfortunately she was found to not be a candidate for valve replacement or mitral clip. I discussed with family my concern that her status will continue to progress into the future and can decrease her functional status and lead to more exacerbations and hospitalizations. I expressed that the medical team is doing all they can for her but they are worried because these options are limited and we know that there is no fix to this progressive disease process.   We discussed the importance of all being on the same page and being aware of the same information to make good decisions. I encouraged that we should discuss again goals of care with the new knowledge we have today. I discussed with them code status and my concern that if she were to decline to the point of requiring resuscitation that I worry this will only cause her suffering at the end of her life. I explained that there are many interventions we can pursue to try and prevent  this from happening but encouraged that CPR and life support would be difficult for Stacy Jenkins and for family to see her go through. After some discussion they all agree with DNR status but wish to pursue full scope care otherwise. They wish for therapy and home health to support at home and would desire rehospitalization if needed. We discussed outpatient palliative support and they agree with referral. Rashay continues to tell us throughout conversation "I will die." I spoke with [name] and she expresses faith and that she does not fear death. Herbie Baltimore shares tearfully that this would mean she will be out of suffering when the time comes. Family do report that Stacy Jenkins has often said "I will die" or "I want to die" since her stroke but reassure that she is not suicidal. Stacy Jenkins seems to have peace with the end of her life and has acceptance found through her faith. I encouraged that these are difficult discussions to have but the focus now should be on living as well as we can with the time we have left. Stacy Jenkins looks forward to returning home with her family and her dog, Stacy Jenkins.   All questions/concerns addressed. Emotional support provided. Updated Heart Failure Team.   Exam: Alert, oriented. No distress. Sitting up in recliner. HR 70s. Breathing regular, unlabored at rest. Abd soft. Legs wrapped and up.   Plan: - DNR decided.  - Home with home health, PT, outpatient palliative.   [place], NP Palliative Medicine Team Pager [phone] (Please see [email or web address] for schedule) Team Phone [phone]    Greater than 50%  of this  time was spent counseling and coordinating care related to the above assessment and plan

## 2023-03-07 NOTE — TOC Progression Note (Signed)
Transition of Care [facility]) - Progression Note    Patient Details  Name: Stacy Jenkins MRN: [id number] Date of Birth: Mar 17, 1961  Transition of Care [facility]) CM/SW Pink, RN Phone Number: 03/07/2023, 4:31 PM  Clinical Narrative:    Order for wheelchair. Patient agreeable to use in house provider adapt.  Erasmo Downer with adapt notified of order.    Expected Discharge Plan: [place] Barriers to Discharge: Continued Medical Work up  Expected Discharge Plan and Services   Discharge Planning Services: CM Consult   Living arrangements for the past 2 months: Single Family Home                 DME Arranged: Wheelchair manual DME Agency: [employer] Date DME Agency Contacted: 03/07/23 Time DME Agency Contacted: [phone] Representative spoke with at DME Agency: [place]: [place] Date [place]: 03/02/23 Time [place]: [id number] Representative spoke with at [place]: [place] Interventions [place]: No Food Insecurity (10/05/2022)  Housing: Low Risk  (10/05/2022)  Transportation Needs: No Transportation Needs (10/05/2022)  Utilities: Not At Risk (10/05/2022)  Tobacco Use: Low Risk  (03/05/2023)    Readmission Risk Interventions     No data to display

## 2023-03-08 ENCOUNTER — Other Ambulatory Visit (HOSPITAL_COMMUNITY): Payer: Self-pay

## 2023-03-08 LAB — BASIC METABOLIC PANEL
Anion gap: 10 (ref 5–15)
BUN: 28 mg/dL — ABNORMAL HIGH (ref 8–23)
CO2: 24 mmol/L (ref 22–32)
Calcium: 8.6 mg/dL — ABNORMAL LOW (ref 8.9–10.3)
Chloride: 99 mmol/L (ref 98–111)
Creatinine, Ser: 1.02 mg/dL — ABNORMAL HIGH (ref 0.44–1.00)
GFR, Estimated: >60 mL/min (ref >60)
Glucose, Bld: 79 mg/dL (ref 70–99)
Potassium: 4 mmol/L (ref 3.5–5.1)
Sodium: 133 mmol/L — ABNORMAL LOW (ref 135–145)

## 2023-03-08 LAB — CBC
HCT: 31.4 % — ABNORMAL LOW (ref 36.0–46.0)
Hemoglobin: 9.1 g/dL — ABNORMAL LOW (ref 12.0–15.0)
MCH: 23.6 pg — ABNORMAL LOW (ref 26.0–34.0)
MCHC: 29 g/dL — ABNORMAL LOW (ref 30.0–36.0)
MCV: 81.3 fL (ref 80.0–100.0)
Platelets: 188 10*3/uL (ref 150–400)
RBC: 3.86 MIL/uL — ABNORMAL LOW (ref 3.87–5.11)
RDW: 27.7 % — ABNORMAL HIGH (ref 11.5–15.5)
WBC: 5.5 10*3/uL (ref 4.0–10.5)
nRBC: 0 % (ref 0.0–0.2)

## 2023-03-08 LAB — GLUCOSE, CAPILLARY
Glucose-Capillary: 86 mg/dL (ref 70–99)
Glucose-Capillary: 106 mg/dL — ABNORMAL HIGH (ref 70–99)

## 2023-03-08 LAB — MAGNESIUM: Magnesium: 2.3 mg/dL (ref 1.7–2.4)

## 2023-03-08 LAB — DIGOXIN LEVEL: Digoxin Level: 0.6 ng/mL — ABNORMAL LOW (ref 0.8–2.0)

## 2023-03-08 MED ORDER — TRAZODONE HCL 50 MG PO TABS
50.0000 mg | ORAL_TABLET | Freq: Every evening | ORAL | 3 refills | Status: DC | PRN
  Filled 2023-03-08: qty 30, 30d supply, fill #0

## 2023-03-08 MED ORDER — SPIRONOLACTONE 50 MG PO TABS
50.0000 mg | ORAL_TABLET | Freq: Every day | ORAL | 5 refills | Status: DC
  Filled 2023-03-08: qty 30, 30d supply, fill #0

## 2023-03-08 MED ORDER — AMIODARONE HCL 200 MG PO TABS
400.0000 mg | ORAL_TABLET | Freq: Two times a day (BID) | ORAL | 0 refills | Status: DC
  Filled 2023-03-08: qty 38, 30d supply, fill #0

## 2023-03-08 MED ORDER — FUROSEMIDE 40 MG PO TABS
40.0000 mg | ORAL_TABLET | Freq: Every day | ORAL | 5 refills | Status: DC
  Filled 2023-03-08: qty 30, 30d supply, fill #0

## 2023-03-08 MED ORDER — POTASSIUM CHLORIDE CRYS ER 20 MEQ PO TBCR
20.0000 meq | EXTENDED_RELEASE_TABLET | Freq: Every day | ORAL | 5 refills | Status: DC
  Filled 2023-03-08: qty 30, 30d supply, fill #0

## 2023-03-08 MED ORDER — DIGOXIN 125 MCG PO TABS
0.1250 mg | ORAL_TABLET | Freq: Every day | ORAL | 5 refills | Status: DC
  Filled 2023-03-08: qty 30, 30d supply, fill #0

## 2023-03-08 MED ORDER — MIDODRINE HCL 10 MG PO TABS
10.0000 mg | ORAL_TABLET | Freq: Three times a day (TID) | ORAL | 5 refills | Status: DC
  Filled 2023-03-08: qty 90, 30d supply, fill #0

## 2023-03-08 MED ORDER — AMIODARONE HCL 200 MG PO TABS
200.0000 mg | ORAL_TABLET | Freq: Every day | ORAL | 4 refills | Status: DC
  Filled 2023-03-08: qty 30, 30d supply, fill #0

## 2023-03-08 NOTE — Progress Notes (Deleted)
MBS  03/07/23 1000  SLP Visit Information  SLP Received On 03/07/23  General Information  HPI Stacy Jenkins is a 62 y.o. 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  She was diagnosed with acute respiratory, acute on chronic systolic heart failure and afib with RVR.  Chest xray was showing moderate pulmonary edema with small right pleural effusion.  Caregiver present No  Diet Prior to this Study Dysphagia 2 (finely chopped);Mildly thick liquids (Level 2, nectar thick)  Temperature  Normal  Respiratory Status WFL  Supplemental O2 None (Room air)  History of Recent Intubation No  Behavior/Cognition Alert;Cooperative  Self-Feeding Abilities Able to self-feed  Baseline vocal quality/speech Normal  Volitional Cough Able to elicit  Volitional Cough Assessment Appears WFL  Volitional Swallow Able to elicit  Anatomy WFL  Orofacial Exam  Oral Cavity: Oral Hygiene WFL  Thin Liquids (Level 0)  Thin Liquids  Impaired  Bolus delivery method Cup;Straw;Spoon  Thin Liquid - Impairment Oral Impairment  Lip Closure No labial escape  Tongue control during bolus hold Escape to lateral buccal cavity/floor of mouth  Bolus transport/lingual motion Repetitive/disorganized tongue motion  Oral residue Trace residue lining oral structures  Location of oral residue  Tongue  Initiation of swallow  Valleculae  Soft palate elevation No bolus between soft palate (SP)/pharyngeal wall (PW)  Laryngeal elevation Complete superior movement of thyroid cartilage with complete approximation of arytenoids to epiglottic petiole  Anterior hyoid excursion Complete  Epiglottic movement Complete  Laryngeal vestibule closure Complete, no air/contrast in laryngeal vestibule   Pharyngeal stripping wave  Present - complete  Pharyngeal contraction (A/P view only) N/A  Pharyngoesophageal segment opening Complete distension and complete duration, no obstruction of flow  Tongue base retraction No contrast between tongue base and posterior pharyngeal wall (PPW)  Pharyngeal residue Complete pharyngeal clearance  Penetration/Aspiration Scale (PAS) score 1.  Material does not enter airway  Mildly thick liquids (Level 2, nectar thick)  Mildly thick liquids (Level 2, nectar thick) Impaired  Bolus delivery method Cup  Mildly Thick Liquid - Impairment Oral Impairment  Lip Closure No labial escape  Tongue control during bolus hold Escape to lateral buccal cavity/floor of mouth  Bolus transport/lingual motion Repetitive/disorganized tongue motion  Oral residue Trace residue lining oral structures  Location of oral residue  Tongue  Initiation of swallow  Valleculae  Soft palate elevation No bolus between soft palate (SP)/pharyngeal wall (PW)  Laryngeal elevation Complete superior movement of thyroid cartilage with complete approximation of arytenoids to epiglottic petiole  Anterior hyoid excursion Complete  Epiglottic movement Complete  Laryngeal vestibule closure Complete, no air/contrast in laryngeal vestibule  Pharyngeal stripping wave  Present - complete  Pharyngeal contraction (A/P view only) N/A  Pharyngoesophageal segment opening Complete distension and complete duration, no obstruction of flow  Tongue base retraction No contrast between tongue base and posterior pharyngeal wall (PPW)  Pharyngeal residue Complete pharyngeal clearance  Penetration/Aspiration Scale (PAS) score 1.  Material does not enter airway  Puree  Puree Impaired  Puree - Impairment Oral Impairment  Lip Closure No labial escape  Bolus transport/lingual motion Repetitive/disorganized tongue motion  Oral residue Complete oral clearance  Location of oral residue  N/A  Initiation of swallow  Posterior angle of the ramus  Soft palate elevation No bolus between soft palate (SP)/pharyngeal wall (PW)  Laryngeal elevation Complete superior movement of thyroid cartilage with complete approximation of arytenoids to epiglottic petiole  Anterior hyoid excursion Complete  Epiglottic movement Complete  Laryngeal vestibule closure Complete, no air/contrast in laryngeal vestibule  Pharyngeal stripping wave  Present - complete  Pharyngeal contraction (A/P view only) N/A  Pharyngoesophageal segment opening Complete distension and complete duration, no obstruction of flow  Tongue base retraction No contrast between tongue base and posterior pharyngeal wall (PPW)  Pharyngeal residue Complete pharyngeal clearance  Location of pharyngeal residue N/A  Penetration/Aspiration Scale (PAS) score 1.  Material does not enter airway  Solid  Solid WFL  Pill  Pill WFL  Compensatory Strategies  Compensatory strategies No  Clinical Impression  Clinical Impression Pt demonstrates mild oral dysphagia with disorganized bolus manipulation with lingual rocking at times to initaite swallow. Otherwise pharyngeal dysphagia resolved. No aspiration or residue. Pharyngeal physiology improved from prior. Will resume a regular diet and thin liquids. No SLP f/u needed will sign off.  SLP Visit Diagnosis Dysphagia, oral phase (R13.11)  Swallowing Evaluation Recommendations  Recommendations PO diet  PO Diet Recommendation Regular;Thin liquids (Level 0)  Liquid Administration via Cup;Straw  Medication Administration Whole meds with liquid  Supervision Patient able to self-feed  Swallowing strategies   Slow rate;Small bites/sips  Postural changes Position pt fully upright for meals  Treatment Plan  Treatment recommendations No treatment recommended at this time  SLP Time Calculation  SLP Start Time (ACUTE ONLY) [id number]  SLP Stop Time (ACUTE ONLY) 1000  SLP Time Calculation (min) (ACUTE ONLY) 23 min  SLP Evaluations  $  SLP Speech Visit 1 Visit  SLP Evaluations  $MBS Swallow 1 Procedure

## 2023-03-08 NOTE — Care Management Important Message (Signed)
Important Message  Patient Details  Name: Stacy Jenkins MRN: [id number] Date of Birth: April 21, 1961   Medicare Important Message Given:  Yes     [name] Montine Circle 03/08/2023, 2:19 PM

## 2023-03-08 NOTE — Progress Notes (Signed)
Occupational Therapy Treatment Patient Details Name: Stacy Jenkins MRN: [id number] DOB: September 15, 1961 Today's Date: 03/08/2023   History of present illness 62 y/o female presented 2/29 for elective TEE. Pt with SOB and tachycardia, TEE cancelled due to A-fib with RVR and hypoxia. Admitted with acute respiratory failure secondary to volume overload, heart failure exacerbation and cardiogenic shock. PMHx: aortic aneurysm repair, CVA, AVR, HFrEF, A-fib, CHF, HTN, DM.   OT comments  Pt pleasant and eager to work with therapy. She enjoyed listening to music during hallway ambulation, which kept her upbeat. She was also able to stand at sink for approx 5 min of grooming activities. Pt required cues to implement energy conservation strategies and feel like HHOT is important to ensure she is able to participate not only in ADL but meaningful occupations that bring quality to her life.    Recommendations for follow up therapy are one component of a multi-disciplinary discharge planning process, led by the attending physician.  Recommendations may be updated based on patient status, additional functional criteria and insurance authorization.    Follow Up Recommendations  Home health OT     Assistance Recommended at Discharge Frequent or constant Supervision/Assistance  Patient can return home with the following  A little help with bathing/dressing/bathroom;Help with stairs or ramp for entrance;Assistance with cooking/housework;Assist for transportation;Direct supervision/assist for financial management;Direct supervision/assist for medications management   Equipment Recommendations  Other (comment) (RW)    Recommendations for Other Services      Precautions / Restrictions Precautions Precautions: Fall Precaution Comments: watch O2 Restrictions Weight Bearing Restrictions: No       Mobility Bed Mobility               General bed mobility comments: in chair on entry     Transfers Overall transfer level: Needs assistance Equipment used: Rolling walker (2 wheels) Transfers: Sit to/from Stand Sit to Stand: Min guard           General transfer comment: min guard for safety, increased time and effort to come to standing     Balance Overall balance assessment: Needs assistance Sitting-balance support: No upper extremity supported, Feet supported Sitting balance-Leahy Scale: Good     Standing balance support: Single extremity supported, Reliant on assistive device for balance Standing balance-Leahy Scale: Poor Standing balance comment: RW for gait                           ADL either performed or assessed with clinical judgement   ADL Overall ADL's : Needs assistance/impaired     Grooming: Standing;Wash/dry face;Brushing hair;Min guard Grooming Details (indicate cue type and reason): at least 5 min standing at sink         Upper Body Dressing : Minimal assistance;Sitting Upper Body Dressing Details (indicate cue type and reason): donning gown as robe     Toilet Transfer: Ambulation;Rolling walker (2 wheels);Min guard   Toileting- Clothing Manipulation and Hygiene: Min guard;Sitting/lateral lean       Functional mobility during ADLs: Min guard;Rolling walker (2 wheels)      Extremity/Trunk Assessment Upper Extremity Assessment Upper Extremity Assessment: Generalized weakness   Lower Extremity Assessment Lower Extremity Assessment: Defer to PT evaluation        Vision   Vision Assessment?: No apparent visual deficits   Perception     Praxis      Cognition Arousal/Alertness: Awake/alert Behavior During Therapy: Flat affect Overall Cognitive Status: Impaired/Different from baseline Area of Impairment: Orientation,  Memory, Safety/judgement, Problem solving                 Orientation Level: Disoriented to, Time   Memory: Decreased short-term memory Following Commands: Follows one step commands  consistently Safety/Judgement: Decreased awareness of safety, Decreased awareness of deficits Awareness: Intellectual Problem Solving: Slow processing General Comments: flat affect, and tearful about getting back to her dog "Stacy Jenkins" however stayed upbeat with music and asking about hobbies        Exercises      Shoulder Instructions       General Comments VSS on RA    Pertinent Vitals/ Pain       Pain Assessment Pain Assessment: No/denies pain  Home Living                                          Prior Functioning/Environment              Frequency  Min 2X/week        Progress Toward Goals  OT Goals(current goals can now be found in the care plan section)  Progress towards OT goals: Progressing toward goals  Acute Rehab OT Goals Patient Stated Goal: get home to dog "Stacy Jenkins" OT Goal Formulation: With patient Time For Goal Achievement: 03/14/23 Potential to Achieve Goals: Good ADL Goals Pt Will Perform Grooming: with supervision;standing Pt Will Perform Lower Body Dressing: with supervision;sit to/from stand Pt Will Transfer to Toilet: with supervision;ambulating Additional ADL Goal #1: Pt will tolerate at least 8x minutes of OOB acitivty to demonstrate increased activity tolerance Additional ADL Goal #2: Pt will indep recall at least 3 energy conservation techniques  Plan Discharge plan remains appropriate    Co-evaluation                 AM-PAC OT "6 Clicks" Daily Activity     Outcome Measure   Help from another person eating meals?: None Help from another person taking care of personal grooming?: A Little Help from another person toileting, which includes using toliet, bedpan, or urinal?: A Little Help from another person bathing (including washing, rinsing, drying)?: A Little Help from another person to put on and taking off regular upper body clothing?: A Little Help from another person to put on and taking off regular lower  body clothing?: A Little 6 Click Score: 19    End of Session Equipment Utilized During Treatment: Rolling walker (2 wheels);Gait belt  OT Visit Diagnosis: Unsteadiness on feet (R26.81);Other abnormalities of gait and mobility (R26.89);Muscle weakness (generalized) (M62.81)   Activity Tolerance Patient tolerated treatment well   Patient Left in chair;with call bell/phone within reach;with chair alarm set;with nursing/sitter in room   Nurse Communication Mobility status        Time: [id number] OT Time Calculation (min): 23 min  Charges: OT General Charges $OT Visit: 1 Visit OT Treatments $Self Care/Home Management : 8-22 mins  [place] Office: [place] 03/08/2023, 1:50 PM

## 2023-03-08 NOTE — Discharge Summary (Signed)
Advanced Heart Failure Team  Discharge Summary   Patient ID: Stacy Jenkins MRN: [id number], DOB/AGE: [birth year] 62 y.o. 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    Stacy Jenkins presented for scheduled TEE/DCCV after being seen in clinic with increased SOB, BLE edema and was found to be in a fib RVR. At [place] appointment date she was in acute respiratory failure and was recommended to present to the ED instead. Patient placed on bipap and admitted on Cardizem gtt.   During admission she developed shock, suspected to be cardiogenic. She required pressors, amio, and aggressive diuresis. Echo showed 4+ torrential MR but patient elected for no invasive procedures. Later underwent TEE which showed severe functional central MR. Due to significant LV dilation and morphology of mitral valve leaflets patient is unlikely to be a successful TEER candidate per structural team. Also had PVMT that self terminated, patient was asymptomatic throughout episode. Ultimately patient was able to be weaned off pressors, amio and diuresed very well.   Palliative and psych teams consulted during admission for situational depression and [place] discussions. Patient elected to be DNR and transition home with [facility] and palliative services. Not ready for hospice.   Pt will continue to be followed closely in the HF clinic, f/u scheduled. Dr Aundra Dubin evaluated and deemed appropriate for  discharge.  Medications delivered to bedside from [place].   See below for detailed problem list:  1.  Shock --> Suspect Cardiogenic -Most recent ECHO EF 45-50% at [facility]. Will need to repeat. Concerned EF will be worse due to ongoing A fib RVR.  Persistent hypotension with elevated lactic acid. CO2 27 SCAI C. Advanced Heart Failure Team Consulted  Lactic acid clearing 3.4>1.4  RHC elevated filling pressures CO 5.3 CI 2.8  - TEE 3/7 with EF 30-35%, moderate RV dysfunction, severe MR - Remains off pressors.   - CVP 7/8. Continue PO Lasix 40 mg daily  - Continue spiro 50 mg daily - Continue farxiga 10 mg daily - Continue midodrine 10 mg tid.  -Renal function stable.  2. A fib RVR  - Maintaining SR.  - Continue PO amiodarone taper, on 400 mg bid. Plan for 200 daily to start 3/16 - On eliquis 5 mg twice a day.  3. Acute Hypoxic Respiratory Failure ,  - improved w/ diuresis  4. MR  - TEE 3/7 severe functional MR with noncoaptation of the mitral valve leaflets.  No clear flail or prolapse. Severe TR, +PFO. Moderately reduced biventricular function - d/w structural heart team. Anatomy likely not favorable for clip. Not candidate for surgery  5.  Iron Deficiency Anemia  -Recent GI bleed 01/2023  -2024 EGD/Colonoscopy with diverticulosis.  -Hgb stable 9.1  -No obvious source.  -Fe 18, Tsat 4. Treated w/ IV Fe  6. CAD 2019 Non obstructive CAD-->10% left main, <25% mid LAD, <25% RCA lesion.  - No chest pain.  7. H/O Bioprosthetic AVR 2019  8. DMII  -Hgb A1C 8 - SSI while inpatient 9. H/O CVA  10.  Hypokalemia - K repleted. 4 today 11. NSVT/ PMVT  - On oral amio taper.  - Keep K > 4.0 and Mg > 2.0  12. [place] - She told staff multiple times that she wants to die.  - Palliative Care appreciated. Now DNR, plan for d/c home with [facility] and palliative care serviced. No hospice for now.  13. Depression - Psychiatry consulted. Refused antidepressants.    Discharge Weight Range: 70.3  kg Discharge Vitals: Blood pressure 91/62, pulse 80, temperature 97.6 F (36.4 C), temperature source Oral, resp. rate 14, height '5\' 7"'$  (1.702 m), weight 70.3 kg, SpO2 96 %.  Labs: Lab Results  Component Value Date   WBC 5.5 03/08/2023   HGB 9.1 (L) 03/08/2023   HCT 31.4 (L) 03/08/2023   MCV 81.3 03/08/2023   PLT 188 03/08/2023    Recent Labs  Lab 03/08/23 0500  NA 133*  K 4.0  CL 99  CO2 24  BUN 28*  CREATININE 1.02*  CALCIUM 8.6*  GLUCOSE 79   Lab Results  Component Value Date   CHOL 167 08/04/2022   HDL 68 08/04/2022   LDLCALC 82 08/04/2022   TRIG 93 08/04/2022   BNP (last 3 results) Recent Labs    02/22/23 1534  BNP 876.3*    ProBNP (last 3 results) Recent Labs    02/21/23 1203  PROBNP 4,604*    Diagnostic Studies/Procedures   DG Swallowing Func-Speech Pathology  Result Date: 03/08/2023 Table formatting from the original result was not included. Modified Barium Swallow Study Patient Details Name: Stacy Jenkins MRN: [id number] Date of Birth: Apr 17, 1961 Today's Date: 03/08/2023 HPI/PMH: HPI: Stacy Jenkins is a 62 y.o. 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  She was diagnosed with acute respiratory, acute on chronic systolic heart failure and afib with RVR.  Chest xray was showing moderate pulmonary edema with small right pleural effusion. Clinical Impression: Clinical Impression: Pt demonstrates mild oral dysphagia with disorganized bolus manipulation with lingual rocking at times to initaite swallow. Otherwise pharyngeal dysphagia resolved. No aspiration or residue. Pharyngeal physiology improved from prior. Will resume a regular diet and thin liquids. No SLP f/u needed will sign off. Factors that may increase  risk of adverse event in presence of aspiration ([place] 2021): No data recorded Recommendations/Plan: Swallowing Evaluation Recommendations Swallowing Evaluation Recommendations Recommendations: PO diet PO Diet Recommendation: Regular; Thin liquids (Level 0) Liquid Administration via: Cup; Straw Medication Administration: Whole meds with liquid Supervision: Patient able to self-feed Swallowing strategies  : Slow rate; Small bites/sips Postural changes: Position pt fully upright for meals Oral care recommendations: Oral care BID (2x/day) Treatment Plan Treatment Plan Treatment recommendations: No treatment recommended at this time Functional status assessment: Patient has had a recent decline in their functional status and demonstrates the ability to make significant improvements in function in a reasonable and predictable amount of time. Treatment frequency: Min 2x/week Treatment duration: 2 weeks Interventions: Compensatory techniques; Patient/family education; Diet toleration management by SLP Recommendations Recommendations for follow up therapy are one component of a multi-disciplinary discharge planning process, led by the attending physician.  Recommendations may be updated based on patient status, additional functional criteria and insurance authorization. Assessment: Orofacial Exam: Orofacial Exam Oral Cavity: Oral Hygiene: WFL Oral Cavity - Dentition: Adequate natural dentition Orofacial Anatomy: WFL Oral Motor/Sensory Function: [facility]  Anatomy: Anatomy: WFL Thin Liquids: Thin Liquids (Level 0) Thin Liquids : Impaired Bolus delivery method: Cup; Straw; Spoon Thin Liquid - Impairment: Oral Impairment Lip Closure: No labial escape Tongue control during bolus hold: Escape to lateral buccal cavity/floor of mouth Bolus transport/lingual motion: Repetitive/disorganized tongue motion Oral residue: Trace residue lining oral structures Location of oral residue : Tongue Initiation of swallow : Valleculae Soft  palate elevation: No bolus between soft palate (SP)/pharyngeal wall (PW) Laryngeal elevation: Complete superior movement of thyroid cartilage with complete approximation of arytenoids to epiglottic petiole Anterior hyoid excursion: Complete Epiglottic movement: Complete Laryngeal vestibule closure: Complete, no air/contrast in laryngeal vestibule Pharyngeal stripping wave : Present - complete Pharyngeal contraction (A/P view only): N/A Pharyngoesophageal segment opening: Complete distension and complete duration, no obstruction of flow Tongue base retraction: No contrast between tongue base and posterior pharyngeal wall (PPW) Pharyngeal residue: Complete pharyngeal clearance Penetration/Aspiration Scale (PAS) score: 1.  Material does not enter airway  Mildly Thick Liquids: Mildly thick liquids (Level 2, nectar thick) Mildly thick liquids (Level 2, nectar thick): Impaired Bolus delivery method: Cup Mildly Thick Liquid - Impairment: Oral Impairment Lip Closure: No labial escape Tongue control during bolus hold: Escape to lateral buccal cavity/floor of mouth Bolus transport/lingual motion: Repetitive/disorganized tongue motion Oral residue: Trace residue lining oral structures Location of oral residue : Tongue Initiation of swallow : Valleculae Soft palate elevation: No bolus between soft palate (SP)/pharyngeal wall (PW) Laryngeal elevation: Complete superior movement of thyroid cartilage with complete approximation of arytenoids to epiglottic petiole Anterior hyoid excursion: Complete Epiglottic movement: Complete Laryngeal vestibule closure: Complete, no air/contrast in laryngeal vestibule Pharyngeal stripping wave : Present - complete Pharyngeal contraction (A/P view only): N/A Pharyngoesophageal segment opening: Complete distension and complete duration, no obstruction of flow Tongue base retraction: No contrast between tongue base and posterior pharyngeal wall (PPW) Pharyngeal residue: Complete pharyngeal  clearance Location of pharyngeal residue: Valleculae Penetration/Aspiration Scale (PAS) score: 1.  Material does not enter airway  Moderately Thick Liquids: Moderately thick liquids (Level 3, honey thick) Moderately thick liquids (Level 3, honey thick): Not Tested  Puree: Puree Puree: Impaired Puree - Impairment: Oral Impairment Lip Closure: No labial escape Bolus transport/lingual motion: Repetitive/disorganized tongue motion Oral residue: Complete oral clearance Location of oral residue : N/A Initiation of swallow: Posterior angle of the ramus Soft palate elevation: No bolus between soft palate (SP)/pharyngeal wall (PW) Laryngeal elevation: Complete superior movement of thyroid cartilage with complete approximation of arytenoids to epiglottic petiole Anterior hyoid excursion: Complete Epiglottic movement: Complete Laryngeal vestibule closure: Complete, no air/contrast in laryngeal vestibule Pharyngeal stripping wave : Present - complete Pharyngeal contraction (A/P view only): N/A Pharyngoesophageal segment opening: Complete distension and complete duration, no obstruction of flow Tongue base retraction: No contrast between tongue base and posterior pharyngeal wall (PPW) Pharyngeal residue: Complete pharyngeal clearance Location of pharyngeal residue: N/A Penetration/Aspiration Scale (PAS) score: 1.  Material does not enter airway Solid: Solid Solid: WFL Solid - Impairment: Oral Impairment; Pharyngeal impairment Lip Closure: No labial escape Bolus preparation/mastication: Slow prolonged chewing/mashing with complete recollection Bolus transport/lingual motion: Repetitive/disorganized tongue motion Oral residue: Trace residue lining oral structures Location of oral residue : Tongue Initiation of swallow: Posterior angle of the ramus Soft palate elevation: No bolus between soft palate (SP)/pharyngeal wall (PW) Laryngeal elevation: Partial superior movement of thyroid cartilage/partial approximation of arytenoids to  epiglottic petiole Anterior hyoid excursion: Partial Epiglottic movement: Complete Laryngeal vestibule closure: Incomplete, narrow column air/contrast in laryngeal vestibule Pharyngeal stripping wave : Present -  complete Pharyngeal contraction (A/P view only): N/A Pharyngoesophageal segment opening: Complete distension and complete duration, no obstruction of flow Tongue base retraction: Trace column of contrast or air between tongue base and PPW Pharyngeal residue: Trace residue within or on pharyngeal structures Location of pharyngeal residue: Valleculae Penetration/Aspiration Scale (PAS) score: 1.  Material does not enter airway Pill: Pill Pill: WFL Compensatory Strategies: Compensatory Strategies Compensatory strategies: No Straw: Ineffective Chin tuck: Ineffective   General Information: Caregiver present: No  Diet Prior to this Study: Dysphagia 2 (finely chopped); Mildly thick liquids (Level 2, nectar thick)   Temperature : Normal   Respiratory Status: WFL   Supplemental O2: None (Room air)   History of Recent Intubation: No  Behavior/Cognition: Alert; Cooperative Self-Feeding Abilities: Able to self-feed Baseline vocal quality/speech: Normal Volitional Cough: Able to elicit Volitional Swallow: Able to elicit Exam Limitations: Limited visibility (of esophagus) Goal Planning: Prognosis for improved oropharyngeal function: Good No data recorded No data recorded Patient/Family Stated Goal: None stated. Consulted and agree with results and recommendations: Patient; Nurse Pain: Pain Assessment Pain Assessment: No/denies pain End of Session: Start Time:SLP Start Time (ACUTE ONLY): [id number] Stop Time: SLP Stop Time (ACUTE ONLY): 1000 Time Calculation:SLP Time Calculation (min) (ACUTE ONLY): 23 min Charges: SLP Evaluations $ SLP Speech Visit: 1 Visit SLP Evaluations $BSS Swallow: 1 Procedure $MBS Swallow: 1 Procedure $Swallowing Treatment: 1 Procedure SLP visit diagnosis: SLP Visit Diagnosis: Dysphagia, oral phase (R13.11)  Past Medical History: Past Medical History: Diagnosis Date  Abnormal echocardiogram 08/20/2018  Added automatically from request for surgery [id number]  Acute blood loss anemia 11/19/2018  Acute hyperglycemia 03/10/2016  Acute post-operative pain 11/19/2018  Aortic valve regurgitation 08/20/2018  Formatting of this note might be different from the original. Added automatically from request for surgery [id number]  Aphasia   Ascending aortic aneurysm ([place])   ASD (atrial septal defect) 02/03/2023  Benign essential hypertension 08/09/2017  at goal on current meds  Formatting of this note might be different from the original. 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 4.6 cm  Elevated blood-pressure reading without diagnosis of hypertension 01/29/2018  will monitor  Formatting of this note might be different from the original. will monitor  Expressive aphasia 11/20/2018  Hiatal hernia 01/29/2018  History of atrial fibrillation 11/20/2018  History of CVA (cerebrovascular accident) 08/09/2017  History of migraine 01/29/2018  Hyperlipidemia 01/08/2019  Hypertension 01/08/2019  Hypoglycemia 01/29/2018  dietary info  Formatting of this note might be different from the original. dietary info  Medicare annual wellness visit, subsequent 02/17/2019  Mixed dyslipidemia 08/09/2017  Nonrheumatic aortic valve insufficiency   Other abnormal glucose 01/29/2018  PFO (patent foramen ovale) 09/11/2012  Postoperative atrial fibrillation ([place]) 11/20/2018  S/P ascending aortic aneurysm repair 11/20/2018  S/P AVR 11/19/2018  Stroke (cerebrum) ([place]) - Left MCA branchStroke in a patient with history of prior strokes and mild residual deficits, s/p tPA 07/13/2017  Stroke ([place])   Vitamin D deficiency 01/29/2018 Past Surgical History: Past Surgical History: Procedure Laterality Date  aortic valve surgery  02/2016  RIGHT HEART CATH N/A  02/26/2023  Procedure: RIGHT HEART CATH;  Surgeon: Hebert Soho, DO;  Location: [facility];  Service: Cardiovascular;  Laterality: N/A;  TEE WITHOUT CARDIOVERSION N/A 07/16/2017  Procedure: TRANSESOPHAGEAL ECHOCARDIOGRAM (TEE);  Surgeon: Sanda Klein, MD;  Location: [facility];  Service: Cardiovascular;  Laterality: N/A;  TEE WITHOUT CARDIOVERSION N/A 03/01/2023  Procedure: TRANSESOPHAGEAL ECHOCARDIOGRAM (TEE);  Surgeon: Buford Dresser, MD;  Location: [facility] ENDOSCOPY;  Service: Cardiovascular;  Laterality: N/A; Not populated for Herbie Baltimore, SLP Osie Bond., M.A. 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 EF 30-35%, RV mod reduced, severe TR   Discharge Medications   Allergies as of 03/08/2023       Reactions   Penicillin G Rash   Pt states she is not allergic        Medication List     STOP taking these medications    aspirin EC 81 MG tablet   Entresto 24-26 MG Generic drug: sacubitril-valsartan       TAKE these medications    amiodarone 400 MG tablet Commonly known as: PACERONE Take 1 tablet (400 mg total) by mouth 2 (two) times daily.   amiodarone 200 MG tablet Commonly known as: PACERONE Take 1 tablet (200 mg total) by mouth daily. Start taking on: March 10, 2023   apixaban 5 MG Tabs tablet Commonly known as: ELIQUIS Take 1 tablet (5 mg total) by mouth 2 (two) times daily.   COQ10 PO Take 1 capsule by mouth daily.   dapagliflozin propanediol 5 MG Tabs tablet Commonly known as: Farxiga Take 1 tablet (5 mg total) by mouth daily before breakfast.   digoxin 0.125 MG tablet Commonly known as: LANOXIN Take 1 tablet (0.125 mg total) by mouth daily.   FISH OIL PO Take 1 capsule by mouth daily.   furosemide 40 MG  tablet Commonly known as: LASIX Take 1 tablet (40 mg total) by mouth daily.   midodrine 10 MG tablet Commonly known as: PROAMATINE Take 1 tablet (10 mg total) by mouth 3 (three) times daily with meals.   multivitamin tablet Take 1 tablet by mouth daily.   potassium chloride SA 20 MEQ tablet Commonly known as: KLOR-CON M Take 1 tablet (20 mEq total) by mouth daily.   rosuvastatin 5 MG tablet Commonly known as: CRESTOR Take 1 tablet by mouth once daily   spironolactone 50 MG tablet Commonly known as: ALDACTONE Take 1 tablet (50 mg total) by mouth daily.   traZODone 50 MG tablet Commonly known as: DESYREL Take 1 tablet (50 mg total) by mouth at bedtime as needed for sleep.               Durable Medical Equipment  (From admission, onward)           Start     Ordered   03/07/23 1556  For home use only DME standard manual wheelchair with seat cushion  Once       Comments: Patient suffers from chronic systolic heart failure which impairs their ability to perform daily activities like bathing, dressing, feeding, grooming, and toileting in the home.  A walker will not resolve issue with performing activities of daily living. A wheelchair will allow patient to safely perform daily activities. Patient can safely propel the wheelchair in the home or has a caregiver who can provide assistance. Length of need lifetime  Accessories: elevating leg rests (ELRs), wheel locks, extensions and anti-tippers.   03/07/23 1558   03/07/23 1541  For home use only DME wheelchair cushion (seat and back)  Once        03/07/23 1540  Disposition   The patient will be discharged in stable condition to home.   Follow-up [place]. Follow up.   Why: Home Health-agency will call to arrange appointments Contact information: [place] [address] [facility] Follow up on 03/21/2023.   Specialty: Cardiology Why: Follow up in the [facility] at [facility] 03/21/23 at 3pm Entrance C, Free valet Contact information: [address] [id number] [place] [phone]                  Duration of Discharge Encounter: Greater than 35 minutes   Signed, Earnie Larsson AGACNP-BC  03/08/2023, 11:46 AM

## 2023-03-08 NOTE — Progress Notes (Addendum)
Advanced Heart Failure Rounding Note  PCP-Cardiologist: [name], MD   Subjective:    3/4 S/P RHC RA 15, PA 60/31 (40)PCWP 30>50, Fick CO/CI 5.3 CI 2.8 . Started on Norepi 5 mcg and diuresed with IV lasix.  3/5 Diuresed with IV lasix + Diuril. Swallow study with recommendations for mildly thick liquids.  Back in SR.  3/6 K 2. Diuretics held. Palliative Care  3/7 Long run of PMVT, spontaneously broke. 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 EF 30-35%, RV mod reduced, severe TR  3/9 Norepi stopped.   Wt stable. CVP 7/8. BP remains soft, on midodrine 10 tid. Maintaining NSR.   Ambulated to bathroom, now sitting up in chair. Denies CP/SOB. Looking forward to d/c today.    Objective:   Weight Range: 70.3 kg Body mass index is 24.27 kg/m.   Vital Signs:   Temp:  [97.3 F (36.3 C)-98.7 F (37.1 C)] 97.6 F (36.4 C) (03/14 0823) Pulse Rate:  [66-80] 80 (03/14 0823) Resp:  [14-19] 14 (03/14 0823) BP: (91-113)/(62-75) 91/62 (03/14 0823) SpO2:  [91 %-96 %] 96 % (03/14 0823) Weight:  [70.3 kg] 70.3 kg (03/14 0428) Last BM Date : 03/07/23  Weight change: Filed Weights   03/05/23 0500 03/07/23 0436 03/08/23 0428  Weight: 67.8 kg 69.9 kg 70.3 kg    Intake/Output:   Intake/Output Summary (Last 24 hours) at 03/08/2023 0854 Last data filed at 03/08/2023 0425 Gross per 24 hour  Intake 120 ml  Output 550 ml  Net -430 ml      Physical Exam  CVP 7/8 General:  chronically ill / elderly appearing.  No respiratory difficulty HEENT: normal Neck: supple. JVD ~7 cm. Carotids 2+ bilat; no bruits. No lymphadenopathy or thyromegaly appreciated. Cor: PMI nondisplaced. Regular rate & rhythm. No rubs, gallops. +HSM. Lungs: clear Abdomen: soft, nontender, nondistended. No hepatosplenomegaly. No bruits or masses. Good bowel sounds. Extremities: no cyanosis, clubbing, rash, trace BLE edema.  + UNNA boots. PICC RUE Neuro: alert & oriented x 3, cranial nerves grossly intact. moves all 4 extremities w/o difficulty. Affect pleasant.   Telemetry   NSR 70s personally checked.   EKG    N/A  Labs    CBC Recent Labs    03/07/23 0255 03/08/23 0500  WBC 7.2 5.5  HGB 9.1* 9.1*  HCT 31.5* 31.4*  MCV 81.8 81.3  PLT 194 0000000   Basic Metabolic Panel Recent Labs    03/07/23 0255 03/08/23 0500  NA 134* 133*  K 4.1 4.0  CL 103 99  CO2 24 24  GLUCOSE 91 79  BUN 27* 28*  CREATININE 0.74 1.02*  CALCIUM 8.6* 8.6*  MG 2.1 2.3   Liver Function Tests No results for input(s): "AST", "ALT", "ALKPHOS", "BILITOT", "PROT", "ALBUMIN" in the last 72 hours.  No results for input(s): "LIPASE", "AMYLASE" in the last 72 hours. Cardiac Enzymes No results for input(s): "CKTOTAL", "CKMB", "CKMBINDEX", "TROPONINI" in the last 72 hours.  BNP: BNP (last 3 results) Recent Labs    02/22/23 1534  BNP 876.3*   ProBNP (last 3 results) Recent Labs    02/21/23 1203  PROBNP 4,604*   D-Dimer No results for input(s): "DDIMER" in the last 72 hours. Hemoglobin A1C No results for input(s): "HGBA1C" in the last 72 hours. Fasting Lipid Panel No results for input(s): "CHOL", "HDL", "LDLCALC", "TRIG", "CHOLHDL", "LDLDIRECT" in the last 72 hours.  Thyroid Function Tests No results for input(s): "TSH", "T4TOTAL", "T3FREE", "THYROIDAB" in the last 72 hours.  Invalid input(s): "FREET3"  Other results:  Imaging   DG Swallowing Func-Speech Pathology  Result Date: 03/08/2023 Table formatting from the original result was not included. Modified Barium Swallow Study Patient Details Name: Stacy Jenkins MRN: [id number] Date of Birth: May 11, 1961 Today's Date: 03/08/2023 HPI/PMH: HPI: Stacy Jenkins is a 62 y.o. 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  She was diagnosed with acute respiratory, acute on chronic systolic heart failure and afib with RVR.  Chest xray was showing moderate pulmonary edema with small right pleural effusion. Clinical Impression: Clinical Impression: Pt demonstrates mild oral dysphagia with disorganized bolus manipulation with lingual rocking at times to initaite swallow. Otherwise pharyngeal dysphagia resolved. No aspiration or residue. Pharyngeal physiology improved from prior. Will resume a regular diet and thin liquids. No SLP f/u needed will sign off. Factors that may increase risk of adverse event in presence of aspiration ([place] 2021): No data recorded Recommendations/Plan: Swallowing Evaluation Recommendations Swallowing Evaluation Recommendations Recommendations: PO diet PO Diet Recommendation: Regular; Thin liquids (Level 0) Liquid Administration via: Cup; Straw Medication Administration: Whole meds with liquid Supervision: Patient able to self-feed Swallowing strategies  : Slow rate; Small bites/sips Postural changes: Position pt fully upright for meals Oral care recommendations: Oral care BID (2x/day) Treatment Plan Treatment Plan Treatment recommendations: No treatment recommended at this time Functional status assessment: Patient has had a recent decline in their functional status and demonstrates the ability to make significant improvements in function in a reasonable and predictable amount of time. Treatment frequency: Min 2x/week Treatment duration: 2 weeks Interventions: Compensatory techniques; Patient/family education; Diet toleration management by SLP Recommendations Recommendations for follow up therapy are one component of a multi-disciplinary discharge planning process, led by the attending physician.  Recommendations may be updated based on patient status, additional functional criteria and insurance  authorization. Assessment: Orofacial Exam: Orofacial Exam Oral Cavity: Oral Hygiene: WFL Oral Cavity - Dentition: Adequate natural dentition Orofacial Anatomy: WFL Oral Motor/Sensory Function: WFL Anatomy: Anatomy: WFL Thin Liquids: Thin Liquids (Level 0) Thin Liquids : Impaired Bolus delivery method: Cup; Straw; Spoon Thin Liquid - Impairment: Oral Impairment Lip Closure: No labial escape Tongue control during bolus hold: Escape to lateral buccal cavity/floor of mouth Bolus transport/lingual motion: Repetitive/disorganized tongue motion Oral residue: Trace residue lining oral structures Location of oral residue : Tongue Initiation of swallow : Valleculae Soft palate elevation: No bolus between soft palate (SP)/pharyngeal wall (PW) Laryngeal elevation: Complete superior movement of thyroid cartilage with complete approximation of arytenoids to epiglottic petiole Anterior hyoid excursion: Complete Epiglottic movement: Complete Laryngeal vestibule closure: Complete, no air/contrast in laryngeal vestibule Pharyngeal stripping wave : Present - complete Pharyngeal contraction (A/P view only): N/A Pharyngoesophageal segment opening: Complete distension and complete duration, no obstruction of flow Tongue base retraction: No contrast between tongue base and posterior pharyngeal wall (PPW) Pharyngeal residue: Complete pharyngeal clearance Penetration/Aspiration Scale (PAS) score: 1.  Material does not enter airway  Mildly Thick Liquids: Mildly thick liquids (Level 2, nectar thick) Mildly thick liquids (Level 2, nectar thick): Impaired Bolus delivery method: Cup Mildly Thick Liquid - Impairment: Oral Impairment Lip Closure: No labial escape Tongue control during bolus hold: Escape to lateral buccal cavity/floor of mouth Bolus transport/lingual motion: Repetitive/disorganized  tongue motion Oral residue: Trace residue lining oral structures Location of oral residue : Tongue Initiation of swallow : Valleculae Soft palate  elevation: No bolus between soft palate (SP)/pharyngeal wall (PW) Laryngeal elevation: Complete superior movement of thyroid cartilage with complete approximation of arytenoids to epiglottic petiole Anterior hyoid excursion: Complete Epiglottic movement: Complete Laryngeal vestibule closure: Complete, no air/contrast in laryngeal vestibule Pharyngeal stripping wave : Present - complete Pharyngeal contraction (A/P view only): N/A Pharyngoesophageal segment opening: Complete distension and complete duration, no obstruction of flow Tongue base retraction: No contrast between tongue base and posterior pharyngeal wall (PPW) Pharyngeal residue: Complete pharyngeal clearance Location of pharyngeal residue: Valleculae Penetration/Aspiration Scale (PAS) score: 1.  Material does not enter airway  Moderately Thick Liquids: Moderately thick liquids (Level 3, honey thick) Moderately thick liquids (Level 3, honey thick): Not Tested  Puree: Puree Puree: Impaired Puree - Impairment: Oral Impairment Lip Closure: No labial escape Bolus transport/lingual motion: Repetitive/disorganized tongue motion Oral residue: Complete oral clearance Location of oral residue : N/A Initiation of swallow: Posterior angle of the ramus Soft palate elevation: No bolus between soft palate (SP)/pharyngeal wall (PW) Laryngeal elevation: Complete superior movement of thyroid cartilage with complete approximation of arytenoids to epiglottic petiole Anterior hyoid excursion: Complete Epiglottic movement: Complete Laryngeal vestibule closure: Complete, no air/contrast in laryngeal vestibule Pharyngeal stripping wave : Present - complete Pharyngeal contraction (A/P view only): N/A Pharyngoesophageal segment opening: Complete distension and complete duration, no obstruction of flow Tongue base retraction: No contrast between tongue base and posterior pharyngeal wall (PPW) Pharyngeal residue: Complete pharyngeal clearance Location of pharyngeal residue: N/A  Penetration/Aspiration Scale (PAS) score: 1.  Material does not enter airway Solid: Solid Solid: WFL Solid - Impairment: Oral Impairment; Pharyngeal impairment Lip Closure: No labial escape Bolus preparation/mastication: Slow prolonged chewing/mashing with complete recollection Bolus transport/lingual motion: Repetitive/disorganized tongue motion Oral residue: Trace residue lining oral structures Location of oral residue : Tongue Initiation of swallow: Posterior angle of the ramus Soft palate elevation: No bolus between soft palate (SP)/pharyngeal wall (PW) Laryngeal elevation: Partial superior movement of thyroid cartilage/partial approximation of arytenoids to epiglottic petiole Anterior hyoid excursion: Partial Epiglottic movement: Complete Laryngeal vestibule closure: Incomplete, narrow column air/contrast in laryngeal vestibule Pharyngeal stripping wave : Present - complete Pharyngeal contraction (A/P view only): N/A Pharyngoesophageal segment opening: Complete distension and complete duration, no obstruction of flow Tongue base retraction: Trace column of contrast or air between tongue base and PPW Pharyngeal residue: Trace residue within or on pharyngeal structures Location of pharyngeal residue: Valleculae Penetration/Aspiration Scale (PAS) score: 1.  Material does not enter airway Pill: Pill Pill: WFL Compensatory Strategies: Compensatory Strategies Compensatory strategies: No Straw: Ineffective Chin tuck: Ineffective   General Information: Caregiver present: No  Diet Prior to this Study: Dysphagia 2 (finely chopped); Mildly thick liquids (Level 2, nectar thick)   Temperature : Normal   Respiratory Status: WFL   Supplemental O2: None (Room air)   History of Recent Intubation: No  Behavior/Cognition: Alert; Cooperative Self-Feeding Abilities: Able to self-feed Baseline vocal quality/speech: Normal Volitional Cough: Able to elicit Volitional Swallow: Able to elicit Exam Limitations: Limited visibility (of  esophagus) Goal Planning: Prognosis for improved oropharyngeal function: Good No data recorded No data recorded Patient/Family Stated Goal: None stated. Consulted and agree with results and recommendations: Patient; Nurse Pain: Pain Assessment Pain Assessment: No/denies pain End of Session: Start Time:SLP Start Time (ACUTE ONLY): [id number] Stop Time: SLP Stop Time (ACUTE ONLY): 1000 Time Calculation:SLP Time Calculation (min) (ACUTE ONLY): 23 min Charges:  SLP Evaluations $ SLP Speech Visit: 1 Visit SLP Evaluations $BSS Swallow: 1 Procedure $MBS Swallow: 1 Procedure $Swallowing Treatment: 1 Procedure SLP visit diagnosis: SLP Visit Diagnosis: Dysphagia, oral phase (R13.11) Past Medical History: Past Medical History: Diagnosis Date  Abnormal echocardiogram 08/20/2018  Added automatically from request for surgery [id number]  Acute blood loss anemia 11/19/2018  Acute hyperglycemia 03/10/2016  Acute post-operative pain 11/19/2018  Aortic valve regurgitation 08/20/2018  Formatting of this note might be different from the original. Added automatically from request for surgery [id number]  Aphasia   Ascending aortic aneurysm ([place])   ASD (atrial septal defect) 02/03/2023  Benign essential hypertension 08/09/2017  at goal on current meds  Formatting of this note might be different from the original. 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 4.6 cm  Elevated blood-pressure reading without diagnosis of hypertension 01/29/2018  will monitor  Formatting of this note might be different from the original. will monitor  Expressive aphasia 11/20/2018  Hiatal hernia 01/29/2018  History of atrial fibrillation 11/20/2018  History of CVA (cerebrovascular accident) 08/09/2017  History of migraine 01/29/2018  Hyperlipidemia 01/08/2019  Hypertension 01/08/2019  Hypoglycemia 01/29/2018  dietary info  Formatting of this note might be  different from the original. dietary info  Medicare annual wellness visit, subsequent 02/17/2019  Mixed dyslipidemia 08/09/2017  Nonrheumatic aortic valve insufficiency   Other abnormal glucose 01/29/2018  PFO (patent foramen ovale) 09/11/2012  Postoperative atrial fibrillation ([place]) 11/20/2018  S/P ascending aortic aneurysm repair 11/20/2018  S/P AVR 11/19/2018  Stroke (cerebrum) ([place]) - Left MCA branchStroke in a patient with history of prior strokes and mild residual deficits, s/p tPA 07/13/2017  Stroke ([place])   Vitamin D deficiency 01/29/2018 Past Surgical History: Past Surgical History: Procedure Laterality Date  aortic valve surgery  02/2016  RIGHT HEART CATH N/A 02/26/2023  Procedure: RIGHT HEART CATH;  Surgeon: Hebert Soho, DO;  Location: [facility];  Service: Cardiovascular;  Laterality: N/A;  TEE WITHOUT CARDIOVERSION N/A 07/16/2017  Procedure: TRANSESOPHAGEAL ECHOCARDIOGRAM (TEE);  Surgeon: Sanda Klein, MD;  Location: [place];  Service: Cardiovascular;  Laterality: N/A;  TEE WITHOUT CARDIOVERSION N/A 03/01/2023  Procedure: TRANSESOPHAGEAL ECHOCARDIOGRAM (TEE);  Surgeon: Buford Dresser, MD;  Location: [facility];  Service: Cardiovascular;  Laterality: N/A; Not populated for Stacy Jenkins, SLP Osie Bond., M.A. CCC-SLP Acute Rehabilitation Services Office [phone] Secure chat preferred 03/08/2023, 8:25 AM    Medications:     Scheduled Medications:  amiodarone  400 mg Oral BID   Followed by   Derrill Memo ON 03/10/2023] amiodarone  200 mg Oral Daily   apixaban  5 mg Oral BID   Chlorhexidine Gluconate Cloth  6 each Topical Daily   dapagliflozin propanediol  10 mg Oral Daily   digoxin  0.125 mg Oral Daily   docusate sodium  100 mg Oral BID   furosemide  40 mg Oral Daily   insulin aspart  0-15 Units Subcutaneous TID WC   insulin aspart  0-5 Units Subcutaneous QHS   midodrine  10 mg Oral TID WC   mouth rinse  15 mL Mouth Rinse 4 times per day   polyethylene glycol  17 g  Oral Daily   potassium chloride  20 mEq Oral Daily   rosuvastatin  5 mg Oral Daily   sodium chloride flush  10-40 mL Intracatheter Q12H   sodium chloride flush  3 mL Intravenous Q12H   sodium  chloride flush  3 mL Intravenous Q12H   sodium chloride flush  3 mL Intravenous Q12H   spironolactone  50 mg Oral Daily    Infusions:  sodium chloride 250 mL (02/28/23 0500)   sodium chloride 10 mL/hr at 03/06/23 1300   sodium chloride      PRN Medications: sodium chloride, sodium chloride, acetaminophen, LORazepam, ondansetron (ZOFRAN) IV, mouth rinse, mouth rinse, sodium chloride flush, sodium chloride flush, sodium chloride flush, traZODone    Patient Profile   Stacy Jenkins is a 62 year old with a history of CVA, hypertension, hyperlipidemia, minimal CAD, patent foramen ovale, chronic systolic heart failure, 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    Admitted 02/22/2023 for the evaluation of dyspnea.   Assessment/Plan  1.  Shock --> Suspect Cardiogenic -Most recent ECHO EF 45-50% at [facility]. Will need to repeat. Concerned EF will be worse due to ongoing A fib RVR.  Persistent hypotension with elevated lactic acid. CO2 27 SCAI C. Advanced Heart Failure Team Consulted  Lactic acid clearing 3.4>1.4  RHC elevated filling pressures CO 5.3 CI 2.8  - TEE 3/7 with EF 30-35%, moderate RV dysfunction, severe MR - Now off pressors.   -CVP 7/8. Continue PO Lasix 40 mg daily  -Continue spiro 50 mg daily - Continue farxiga 10 mg daily - Continue midodrine 10 mg tid.  -Renal function stable.   2. A fib RVR  - Maintaining SR.  - Continue PO amiodarone taper, on 400 mg bid. Plan for 200 daily to start 3/16 - On eliquis 5 mg twice a day.    3. Acute Hypoxic Respiratory Failure ,  - improved w/ diuresis   4. MR  - TEE 3/7 severe functional MR with noncoaptation of the mitral valve leaflets.  No clear flail or  prolapse. Severe TR, +PFO. Moderately reduced biventricular function - d/w structural heart team. Anatomy likely not favorable for clip. Not candidate for surgery    5.  Iron Deficiency Anemia  -Recent GI bleed 01/2023  -2024 EGD/Colonoscopy with diverticulosis.  -Hgb stable 9.1  -No obvious source.  -Fe 18, Tsat 4. Treated w/ IV Fe    6. CAD 2019 Non obstructive CAD-->10% left main, <25% mid LAD, <25% RCA lesion.  - No chest pain.    7. H/O Bioprosthetic AVR 2019    8. DMII  -Hgb A1C 8 -Continue SSI    9. H/O CVA   10. Hypokalemia - K repleted.   11. NSVT/ PMVT  - On oral amio taper.  - Keep K > 4.0 and Mg > 2.0   12. [place] - She told staff multiple times that she wants to die.  - Palliative Care appreciated. Now DNR, plan for d/c home with [facility] and palliative care serviced. No hospice for now.   13. Depression - Psychiatry consulted. Refused antidepressants.   Plan for d/c later today with [facility] and palliative, will send meds to [facility] and arrange f/u.   Length of Stay: [place]ack[place], NP  03/08/2023, 8:54 AM  Advanced Heart Failure Team Pager [phone] (M-F; 7a - 5p)  Please contact [facility] for night-coverage after hours (5p -7a ) and weekends on [email or web address]  Patient seen with NP, agree with the above note.   Creatinine stable.  No complaints.   General: NAD Neck: No JVD, no thyromegaly or thyroid nodule.  Lungs: Clear to auscultation bilaterally with normal respiratory effort. CV: Nondisplaced PMI.  Heart  regular S1/S2, no S3/S4, 3/6 HSM apex.  No peripheral edema.   Abdomen: Soft, nontender, no hepatosplenomegaly, no distention.  Skin: Intact without lesions or rashes.  Neurologic: Alert and oriented x 3.  Psych: Normal affect. Extremities: No clubbing or cyanosis.  HEENT: Normal.   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  Markedly dilated LV with functional MR due to poorly coapting leaflets, TEE was reviewed with structural heart team  last week and she was not thought to be a candidate for Mitraclip.  Not candidate for open surgery. CVP 8 today with SBP 90s.    - Continue Lasix 40 mg po daily.  - Continue midodrine 10 tid for now, SBP 90s-100s. - Continue digoxin 0.125 - Continue spironolactone 50 daily.  - Continue Farxiga 10 daily.  - No interventional option for MV, will do the best we can with meds.    She is in NSR on amiodarone and apixaban. Will give amiodarone 400 mg bid x 1 week then 200 mg bid x 1 week then 200 mg daily.    Patient seen by palliative care service, made DNR and will have home palliative services but not hospice.  Home today with CHF clinic followup.   Loralie Champagne 03/08/2023 2:34 PM

## 2023-03-08 NOTE — Plan of Care (Signed)
  Problem: Education: Goal: Knowledge of General Education information will improve Description: Including pain rating scale, medication(s)/side effects and non-pharmacologic comfort measures Outcome: Adequate for Discharge   Problem: Health Behavior/Discharge Planning: Goal: Ability to manage health-related needs will improve Outcome: Adequate for Discharge   Problem: Clinical Measurements: Goal: Ability to maintain clinical measurements within normal limits will improve Outcome: Adequate for Discharge Goal: Will remain free from infection Outcome: Adequate for Discharge Goal: Diagnostic test results will improve Outcome: Adequate for Discharge Goal: Respiratory complications will improve Outcome: Adequate for Discharge Goal: Cardiovascular complication will be avoided Outcome: Adequate for Discharge   Problem: Activity: Goal: Risk for activity intolerance will decrease Outcome: Adequate for Discharge   Problem: Nutrition: Goal: Adequate nutrition will be maintained Outcome: Adequate for Discharge   Problem: Coping: Goal: Level of anxiety will decrease Outcome: Adequate for Discharge   Problem: Elimination: Goal: Will not experience complications related to bowel motility Outcome: Adequate for Discharge Goal: Will not experience complications related to urinary retention Outcome: Adequate for Discharge   Problem: Pain Managment: Goal: General experience of comfort will improve Outcome: Adequate for Discharge   Problem: Safety: Goal: Ability to remain free from injury will improve Outcome: Adequate for Discharge   Problem: Skin Integrity: Goal: Risk for impaired skin integrity will decrease Outcome: Adequate for Discharge   Problem: Education: Goal: Understanding of CV disease, CV risk reduction, and recovery process will improve Outcome: Adequate for Discharge Goal: Individualized Educational Video(s) Outcome: Adequate for Discharge   Problem:  Activity: Goal: Ability to return to baseline activity level will improve Outcome: Adequate for Discharge   Problem: Cardiovascular: Goal: Ability to achieve and maintain adequate cardiovascular perfusion will improve Outcome: Adequate for Discharge Goal: Vascular access site(s) Level 0-1 will be maintained Outcome: Adequate for Discharge   Problem: Health Behavior/Discharge Planning: Goal: Ability to safely manage health-related needs after discharge will improve Outcome: Adequate for Discharge   Problem: Education: Goal: Ability to describe self-care measures that may prevent or decrease complications (Diabetes Survival Skills Education) will improve Outcome: Adequate for Discharge Goal: Individualized Educational Video(s) Outcome: Adequate for Discharge   Problem: Coping: Goal: Ability to adjust to condition or change in health will improve Outcome: Adequate for Discharge   Problem: Fluid Volume: Goal: Ability to maintain a balanced intake and output will improve Outcome: Adequate for Discharge   Problem: Health Behavior/Discharge Planning: Goal: Ability to identify and utilize available resources and services will improve Outcome: Adequate for Discharge Goal: Ability to manage health-related needs will improve Outcome: Adequate for Discharge   Problem: Metabolic: Goal: Ability to maintain appropriate glucose levels will improve Outcome: Adequate for Discharge   Problem: Nutritional: Goal: Maintenance of adequate nutrition will improve Outcome: Adequate for Discharge Goal: Progress toward achieving an optimal weight will improve Outcome: Adequate for Discharge   Problem: Skin Integrity: Goal: Risk for impaired skin integrity will decrease Outcome: Adequate for Discharge   Problem: Tissue Perfusion: Goal: Adequacy of tissue perfusion will improve Outcome: Adequate for Discharge   

## 2023-03-08 NOTE — TOC Transition Note (Signed)
Transition of Care [facility]) - CM/SW Discharge Note   Patient Details  Name: Stacy Jenkins MRN: [id number] Date of Birth: [birth year]  [facility]) CM/SW Contact:  Erenest Rasher, RN Phone Number: [phone] 03/08/2023, 1:15 PM   Clinical Narrative:    [place] for wheelchair for home. Will deliver to pt's room. Contacted [facility] for scheduled dc home today.    Final next level of care: Home w Home Health Services Barriers to Discharge: No Barriers Identified   Patient Goals and CMS Choice CMS Medicare.gov Compare Post Acute Care list provided to:: Patient Represenative (must comment)    Discharge Placement                         Discharge Plan and Services Additional resources added to the After Visit Summary for     Discharge Planning Services: CM Consult            DME Arranged: Wheelchair manual DME Agency: [employer] Date DME Agency Contacted: 03/07/23 Time DME Agency Contacted: [phone] Representative spoke with at DME Agency: [place]: [place] Date [place]: 03/02/23 Time [place]: [id number] Representative spoke with at [place]: [place] ([place]) Interventions [place]: No Food Insecurity (10/05/2022)  Housing: Low Risk  (10/05/2022)  Transportation Needs: No Transportation Needs (10/05/2022)  Utilities: Not At Risk (10/05/2022)  Tobacco Use: Low Risk  (03/05/2023)     Readmission Risk Interventions     No data to display

## 2023-03-08 NOTE — Progress Notes (Signed)
MBS  03/07/23 1000  SLP Visit Information  SLP Received On 03/07/23  General Information  HPI Stacy Jenkins is a 62 y.o. 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  She was diagnosed with acute respiratory, acute on chronic systolic heart failure and afib with RVR.  Chest xray was showing moderate pulmonary edema with small right pleural effusion.  Caregiver present No  Diet Prior to this Study Dysphagia 2 (finely chopped);Mildly thick liquids (Level 2, nectar thick)  Temperature  Normal  Respiratory Status WFL  Supplemental O2 None (Room air)  History of Recent Intubation No  Behavior/Cognition Alert;Cooperative  Self-Feeding Abilities Able to self-feed  Baseline vocal quality/speech Normal  Volitional Cough Able to elicit  Volitional Cough Assessment Appears WFL  Volitional Swallow Able to elicit  Anatomy WFL  Orofacial Exam  Oral Cavity: Oral Hygiene WFL  Thin Liquids (Level 0)  Thin Liquids  Impaired  Bolus delivery method Cup;Straw;Spoon  Thin Liquid - Impairment Oral Impairment  Lip Closure No labial escape  Tongue control during bolus hold Escape to lateral buccal cavity/floor of mouth  Bolus transport/lingual motion Repetitive/disorganized tongue motion  Oral residue Trace residue lining oral structures  Location of oral residue  Tongue  Initiation of swallow  Valleculae  Soft palate elevation No bolus between soft palate (SP)/pharyngeal wall (PW)  Laryngeal elevation Complete superior movement of thyroid cartilage with complete approximation of arytenoids to epiglottic petiole  Anterior hyoid excursion Complete  Epiglottic movement Complete  Laryngeal vestibule closure Complete, no air/contrast in laryngeal vestibule   Pharyngeal stripping wave  Present - complete  Pharyngeal contraction (A/P view only) N/A  Pharyngoesophageal segment opening Complete distension and complete duration, no obstruction of flow  Tongue base retraction No contrast between tongue base and posterior pharyngeal wall (PPW)  Pharyngeal residue Complete pharyngeal clearance  Penetration/Aspiration Scale (PAS) score 1.  Material does not enter airway  Mildly thick liquids (Level 2, nectar thick)  Mildly thick liquids (Level 2, nectar thick) Impaired  Bolus delivery method Cup  Mildly Thick Liquid - Impairment Oral Impairment  Lip Closure No labial escape  Tongue control during bolus hold Escape to lateral buccal cavity/floor of mouth  Bolus transport/lingual motion Repetitive/disorganized tongue motion  Oral residue Trace residue lining oral structures  Location of oral residue  Tongue  Initiation of swallow  Valleculae  Soft palate elevation No bolus between soft palate (SP)/pharyngeal wall (PW)  Laryngeal elevation Complete superior movement of thyroid cartilage with complete approximation of arytenoids to epiglottic petiole  Anterior hyoid excursion Complete  Epiglottic movement Complete  Laryngeal vestibule closure Complete, no air/contrast in laryngeal vestibule  Pharyngeal stripping wave  Present - complete  Pharyngeal contraction (A/P view only) N/A  Pharyngoesophageal segment opening Complete distension and complete duration, no obstruction of flow  Tongue base retraction No contrast between tongue base and posterior pharyngeal wall (PPW)  Pharyngeal residue Complete pharyngeal clearance  Penetration/Aspiration Scale (PAS) score 1.  Material does not enter airway  Puree  Puree Impaired  Puree - Impairment Oral Impairment  Lip Closure No labial escape  Bolus transport/lingual motion Repetitive/disorganized tongue motion  Oral residue Complete oral clearance  Location of oral residue  N/A  Initiation of swallow  Posterior angle of the ramus  Soft palate elevation No bolus between soft palate (SP)/pharyngeal wall (PW)  Laryngeal elevation Complete superior movement of thyroid cartilage with complete approximation of arytenoids to epiglottic petiole  Anterior hyoid excursion Complete  Epiglottic movement Complete  Laryngeal vestibule closure Complete, no air/contrast in laryngeal vestibule  Pharyngeal stripping wave  Present - complete  Pharyngeal contraction (A/P view only) N/A  Pharyngoesophageal segment opening Complete distension and complete duration, no obstruction of flow  Tongue base retraction No contrast between tongue base and posterior pharyngeal wall (PPW)  Pharyngeal residue Complete pharyngeal clearance  Location of pharyngeal residue N/A  Penetration/Aspiration Scale (PAS) score 1.  Material does not enter airway  Solid  Solid WFL  Pill  Pill WFL  Compensatory Strategies  Compensatory strategies No  Clinical Impression  Clinical Impression Pt demonstrates mild oral dysphagia with disorganized bolus manipulation with lingual rocking at times to initaite swallow. Otherwise pharyngeal dysphagia resolved. No aspiration or residue. Pharyngeal physiology improved from prior. Will resume a regular diet and thin liquids. No SLP f/u needed will sign off.  SLP Visit Diagnosis Dysphagia, oral phase (R13.11)  Swallowing Evaluation Recommendations  Recommendations PO diet  PO Diet Recommendation Regular;Thin liquids (Level 0)  Liquid Administration via Cup;Straw  Medication Administration Whole meds with liquid  Supervision Patient able to self-feed  Swallowing strategies   Slow rate;Small bites/sips  Postural changes Position pt fully upright for meals  Treatment Plan  Treatment recommendations No treatment recommended at this time  SLP Time Calculation  SLP Start Time (ACUTE ONLY) [id number]  SLP Stop Time (ACUTE ONLY) 1000  SLP Time Calculation (min) (ACUTE ONLY) 23 min  SLP Evaluations  $  SLP Speech Visit 1 Visit  SLP Evaluations  $MBS Swallow 1 Procedure

## 2023-03-15 ENCOUNTER — Encounter: Payer: Self-pay | Admitting: Cardiology

## 2023-03-15 ENCOUNTER — Ambulatory Visit: Payer: PPO | Attending: Cardiology | Admitting: Cardiology

## 2023-03-15 VITALS — BP 100/62 | HR 81 | Ht 65.0 in | Wt 158.1 lb

## 2023-03-15 DIAGNOSIS — I5023 Acute on chronic systolic (congestive) heart failure: Secondary | ICD-10-CM

## 2023-03-15 DIAGNOSIS — Z8679 Personal history of other diseases of the circulatory system: Secondary | ICD-10-CM

## 2023-03-15 DIAGNOSIS — I10 Essential (primary) hypertension: Secondary | ICD-10-CM

## 2023-03-15 DIAGNOSIS — I420 Dilated cardiomyopathy: Secondary | ICD-10-CM | POA: Diagnosis not present

## 2023-03-15 DIAGNOSIS — Z952 Presence of prosthetic heart valve: Secondary | ICD-10-CM | POA: Diagnosis not present

## 2023-03-15 DIAGNOSIS — Z8673 Personal history of transient ischemic attack (TIA), and cerebral infarction without residual deficits: Secondary | ICD-10-CM

## 2023-03-15 DIAGNOSIS — I340 Nonrheumatic mitral (valve) insufficiency: Secondary | ICD-10-CM

## 2023-03-15 DIAGNOSIS — Z9889 Other specified postprocedural states: Secondary | ICD-10-CM

## 2023-03-15 DIAGNOSIS — I351 Nonrheumatic aortic (valve) insufficiency: Secondary | ICD-10-CM

## 2023-03-15 DIAGNOSIS — E782 Mixed hyperlipidemia: Secondary | ICD-10-CM

## 2023-03-15 NOTE — Patient Instructions (Signed)
Medication Instructions:  Your physician recommends that you continue on your current medications as directed. Please refer to the Current Medication list given to you today.  *If you need a refill on your cardiac medications before your next appointment, please call your pharmacy*   Lab Work: Your physician recommends that you return for lab work in: 2 weeks You need to have labs done when you are fasting.  You can come Monday through Friday 8:30 am to 12:00 pm and 1:15 to 4:30. You do not need to make an appointment as the order has already been placed. The labs you are going to have done are BMET, CBC and ProBNP.  If you have labs (blood work) drawn today and your tests are completely normal, you will receive your results only by: [place] (if you have MyChart) OR A paper copy in the mail If you have any lab test that is abnormal or we need to change your treatment, we will call you to review the results.   Testing/Procedures: None ordered   Follow-Up: At [facility], you and your health needs are our priority.  As part of our continuing mission to provide you with exceptional heart care, we have created designated Provider Care Teams.  These Care Teams include your primary Cardiologist (physician) and Advanced Practice Providers (APPs -  Physician Assistants and Nurse Practitioners) who all work together to provide you with the care you need, when you need it.  We recommend signing up for the patient portal called "MyChart".  Sign up information is provided on this After Visit Summary.  MyChart is used to connect with patients for Virtual Visits (Telemedicine).  Patients are able to view lab/test results, encounter notes, upcoming appointments, etc.  Non-urgent messages can be sent to your provider as well.   To learn more about what you can do with MyChart, go to [email or web address].    Your next appointment:   1 month(s)  The format for your next appointment:    In Person  Provider:   Jyl Heinz, MD    Other Instructions none  Important Information About Sugar

## 2023-03-15 NOTE — Progress Notes (Signed)
Cardiology Office Note:    Date:  03/15/2023   ID:  Stacy Jenkins, DOB June 05, 1961, MRN [id number]  PCP:  Stacy Broker, MD  Cardiologist:  Stacy Lindau, MD   Referring MD: Stacy Broker, MD    ASSESSMENT:    1. Acute on chronic systolic heart failure (Stacy Jenkins)   2. Benign essential hypertension   3. Dilated cardiomyopathy (Stacy Jenkins)   4. S/P AVR   5. S/P ascending aortic aneurysm repair   6. Mixed dyslipidemia   7. History of CVA (cerebrovascular accident)   8. History of atrial fibrillation   9. Nonrheumatic aortic valve insufficiency   10. Nonrheumatic mitral valve regurgitation    PLAN:    In order of problems listed above:  Primary prevention stressed with the patient.  Importance of compliance with diet medication stressed and she vocalized understanding. Congestive heart failure and cardiomyopathy: Stable at this time.  I reviewed hospital notes and medical management and palliative care was recommended.  I agree with the management plan.  She is high risk for any intervention for cardiomyopathy in view of multiple comorbidities and the fact that she has severe mitral regurgitation. Severe mitral regurgitation: Medical management at this time.  Her blood pressure is optimized.  Salt intake issues and daily weight checks were recommended. Paroxysmal atrial fibrillation: On amiodarone therapy in sinus rhythm.  Benefits risks of this was explained and patient vocalized understanding and questions were answered to her satisfaction. Post aortic valve replacement and aortic aneurysm repair: Echocardiogram reveals satisfactory functioning. She will be seen in follow-up appointment in a month or earlier if she has any concerns.  She will be back in 2 weeks for a Chem-7 and a BNP.  Multiple questions were answered to their satisfaction and I reviewed positives: Records from hospitalization.   Medication Adjustments/Labs and Tests Ordered: Current medicines are reviewed at  length with the patient today.  Concerns regarding medicines are outlined above.  No orders of the defined types were placed in this encounter.  No orders of the defined types were placed in this encounter.    No chief complaint on file.    History of Present Illness:    Stacy Jenkins is a 62 y.o. female.  Patient has past medical history of essential hypertension, aortic valve aneurysm postrepair and aortic valve replacement.  She has history of paroxysmal atrial fibrillation, stroke and recent admission to the hospital revealed ejection fraction which is moderately depressed, severe mitral regurgitation.  She is feeling better and is ambulating now and she is happy about it.  She is accompanied by her husband.  At the time of my evaluation, the patient is alert awake oriented and in no distress.  Past Medical History:  Diagnosis Date   Abnormal echocardiogram 08/20/2018   Added automatically from request for surgery [id number]   Acute blood loss anemia 11/19/2018   Acute hyperglycemia 03/10/2016   Acute on chronic systolic heart failure (Stacy Jenkins) 02/22/2023   Acute post-operative pain 11/19/2018   Aortic valve regurgitation 08/20/2018   Formatting of this note might be different from the original. Added automatically from request for surgery [id number]   Ascending aortic aneurysm (Stacy Jenkins)    ASD (atrial septal defect) 02/03/2023   Benign essential hypertension 08/09/2017   at goal on current meds  Formatting of this note might be different from the original. at goal on current meds   Cardiomyopathy (Stacy Jenkins) 01/29/2018   Dilated aortic root (Stacy Jenkins) 09/11/2012   Formatting of this note might  be different from the original. 4.6 cm   Elevated blood-pressure reading without diagnosis of hypertension 01/29/2018   will monitor  Formatting of this note might be different from the original. will monitor   Expressive aphasia 11/20/2018   Hiatal hernia 01/29/2018   History of atrial fibrillation 11/20/2018    History of CVA (cerebrovascular accident) 08/09/2017   History of migraine 01/29/2018   Hyperlipidemia 01/08/2019   Hypertension 01/08/2019   Hypoglycemia 01/29/2018   dietary info  Formatting of this note might be different from the original. dietary info   Malnutrition of moderate degree 03/01/2023   Medicare annual wellness visit, subsequent 02/17/2019   Mixed dyslipidemia 08/09/2017   Nonrheumatic aortic valve insufficiency    Nonrheumatic mitral valve regurgitation 03/01/2023   Other abnormal glucose 01/29/2018   PFO (patent foramen ovale) 09/11/2012   Postoperative atrial fibrillation (Stacy Jenkins) 11/20/2018   S/P ascending aortic aneurysm repair 11/20/2018   S/P AVR 11/19/2018   Stroke (cerebrum) (Stacy Jenkins) - Left MCA branchStroke in a patient with history of prior strokes and mild residual deficits, s/p tPA 07/13/2017   Stroke (Stacy Jenkins)    Vitamin D deficiency 01/29/2018    Past Surgical History:  Procedure Laterality Date   aortic valve surgery  02/2016   RIGHT HEART CATH N/A 02/26/2023   Procedure: RIGHT HEART CATH;  Surgeon: Stacy Soho, DO;  Location: [facility];  Service: Cardiovascular;  Laterality: N/A;   TEE WITHOUT CARDIOVERSION N/A 07/16/2017   Procedure: TRANSESOPHAGEAL ECHOCARDIOGRAM (TEE);  Surgeon: Stacy Klein, MD;  Location: [place];  Service: Cardiovascular;  Laterality: N/A;   TEE WITHOUT CARDIOVERSION N/A 03/01/2023   Procedure: TRANSESOPHAGEAL ECHOCARDIOGRAM (TEE);  Surgeon: Stacy Dresser, MD;  Location: [facility];  Service: Cardiovascular;  Laterality: N/A;    Current Medications: Current Meds  Medication Sig   amiodarone (PACERONE) 200 MG tablet Take 2 tablets (400 mg total) by mouth 2 (two) times daily for 5 doses. Beginning 3/16 take 1 tablet (200mg ) once daily.   amiodarone (PACERONE) 200 MG tablet Take 1 tablet (200 mg total) by mouth daily.   apixaban (ELIQUIS) 5 MG TABS tablet Take 1 tablet (5 mg total) by mouth 2 (two) times  daily.   Coenzyme Q10 (COQ10 PO) Take 1 capsule by mouth daily.   dapagliflozin propanediol (FARXIGA) 5 MG TABS tablet Take 1 tablet (5 mg total) by mouth daily before breakfast.   digoxin (LANOXIN) 0.125 MG tablet Take 1 tablet (0.125 mg total) by mouth daily.   furosemide (LASIX) 40 MG tablet Take 1 tablet (40 mg total) by mouth daily.   midodrine (PROAMATINE) 10 MG tablet Take 1 tablet (10 mg total) by mouth 3 (three) times daily with meals.   Multiple Vitamin (MULTIVITAMIN) tablet Take 1 tablet by mouth daily.   Omega-3 Fatty Acids (FISH OIL PO) Take 1 capsule by mouth daily.   potassium chloride SA (KLOR-CON M) 20 MEQ tablet Take 1 tablet (20 mEq total) by mouth daily.   rosuvastatin (CRESTOR) 5 MG tablet Take 1 tablet by mouth once daily   spironolactone (ALDACTONE) 50 MG tablet Take 1 tablet (50 mg total) by mouth daily.     Allergies:   Penicillin g   Social History   Socioeconomic History   Marital status: Married    Spouse name: Not on file   Number of children: Not on file   Years of education: Not on file   Highest education level: Not on file  Occupational History   Not on file  Tobacco Use   Smoking status: Never   Smokeless tobacco: Never  Vaping Use   Vaping Use: Never used  Substance and Sexual Activity   Alcohol use: No   Drug use: No   Sexual activity: Not on file  Other Topics Concern   Not on file  Social History Narrative   Not on file   Social Determinants of Health   Financial Resource Strain: Not on file  Food Insecurity: No Food Insecurity (10/05/2022)   Hunger Vital Sign    Worried About Running Out of Food in the Last Year: Never true    Ran Out of Food in the Last Year: Never true  Transportation Needs: No Transportation Needs (10/05/2022)   PRAPARE - Hydrologist (Medical): No    Lack of Transportation (Non-Medical): No  Physical Activity: Not on file  Stress: Not on file  Social Connections: Not on file      Family History: The patient's family history includes Heart attack in her father; Heart disease in her brother and father; Hypertension in her mother.  ROS:   Please see the history of present illness.    All other systems reviewed and are negative.  EKGs/Labs/Other Studies Reviewed:    The following studies were reviewed today: Sinus rhythm right bundle branch block LVH and septal infarction of undetermined age   Recent Labs: 08/04/2022: TSH 2.980 02/21/2023: NT-Pro BNP 4,604 02/22/2023: B Natriuretic Peptide 876.3 02/26/2023: ALT 200 03/08/2023: BUN 28; Creatinine, Ser 1.02; Hemoglobin 9.1; Magnesium 2.3; Platelets 188; Potassium 4.0; Sodium 133  Recent Lipid Panel    Component Value Date/Time   CHOL 167 08/04/2022 1028   TRIG 93 08/04/2022 1028   HDL 68 08/04/2022 1028   CHOLHDL 2.5 08/04/2022 1028   CHOLHDL 2.7 07/14/2017 0241   VLDL 19 07/14/2017 0241   LDLCALC 82 08/04/2022 1028    Physical Exam:    VS:  BP 100/62   Pulse 81   Ht 5\' 5"  (1.651 m)   Wt 158 lb 1.9 oz (71.7 kg)   SpO2 95%   BMI 26.31 kg/m     Wt Readings from Last 3 Encounters:  03/15/23 158 lb 1.9 oz (71.7 kg)  03/08/23 154 lb 15.7 oz (70.3 kg)  02/22/23 170 lb (77.1 kg)     GEN: Patient is in no acute distress HEENT: Normal NECK: No JVD; No carotid bruits LYMPHATICS: No lymphadenopathy CARDIAC: Hear sounds regular, 2/6 systolic murmur at the apex. RESPIRATORY:  Clear to auscultation without rales, wheezing or rhonchi  ABDOMEN: Soft, non-tender, non-distended MUSCULOSKELETAL:  No edema; No deformity  SKIN: Warm and dry NEUROLOGIC:  Alert and oriented x 3 PSYCHIATRIC:  Normal affect   Signed, Stacy Lindau, MD  03/15/2023 10:31 AM    [place]

## 2023-03-16 NOTE — Progress Notes (Signed)
ADVANCED HF CLINIC CONSULT NOTE  Referring Physician: [name], MD Primary Care: [name], MD Primary Cardiologist: [name], MD  HPI: [name] is a 62 year old with a history of thoracic aneurysm 2017, bioprosthetic aortic valve, DMII, HLD, HTN. PAF,  GI bleed, diverticulosis, chronic systolic HF.     Echocardiogram in 2019 showed severe aortic regurgitation with EF down to 35 to 40% with inferior septal akinesis.  Subsequent left and right heart cath at [facility] on 09/11/2018 demonstrated 10% left main, <25% mid LAD, <25% RCA lesion. Patient ultimately underwent bioprosthetic AVR on 11/18/2018 with 21 mm Sorin Perceval rapid deployment sutureless stented bovine pericardial valve.  Preoperative CT did mention a focal outpouching on the medial side of ascending aorta up to 1.2 cm.  She had postoperative atrial fibrillation requiring amiodarone and Eliquis.    Hospitalized at [facility] 02/03/23 with acute blood loss anemia. Transferred to [facility]. Hgb 4.8. Transfused 3UPRBC. EGD + Colonoscopy severe diverticulosis. Eliquis was restarted at discharge.    Saw Dr Irean Hong 02/21/23. EKG showed A flutter.  Set up for TEE/Cardioversion.    Presented for scheduled TEE/DC-CV on 2/29 this was cancelled due to acute respiratory failure. Admitted 02/22/23 with A fib RVR and acute respiratory failure. CXR with pulmonary edema. O2 sats 88%. Placed on Bipap and diltiazem. Cardiology consulted. Diuresing with IV lasix. Over the weekend on and off Bipap. Remains in A fib RVR. Hypotensive over the week. Lactic acid 2.5>2.2>2.7. Hgb 10.2>8.4  3/4 S/P RHC RA 15, PA 60/31 (40)PCWP 30>50, Fick CO/CI 5.3 CI 2.8 . Started on Norepi 5 mcg and diuresed with IV lasix.  3/5 Diuresed with IV lasix + Diuril. Swallow study with recommendations for mildly thick liquids.  Back in SR.  3/6 K 2. Diuretics held. Palliative Care  3/7 Long run of PMVT, spontaneously broke. 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 EF 30-35%, RV mod reduced, severe TR  3/9 Norepi stopped.      Review of Systems: [y] = yes, [ ]  = no   General: Weight gain [ ] ; Weight loss [ ] ; Anorexia [ ] ; Fatigue [ ] ; Fever [ ] ; Chills [ ] ; Weakness [ ]   Cardiac: Chest pain/pressure [ ] ; Resting SOB [ ] ; Exertional SOB [ ] ; Orthopnea [ ] ; Pedal Edema [ ] ; Palpitations [ ] ; Syncope [ ] ; Presyncope [ ] ; Paroxysmal nocturnal dyspnea[ ]   Pulmonary: Cough [ ] ; Wheezing[ ] ; Hemoptysis[ ] ; Sputum [ ] ; Snoring [ ]   GI: Vomiting[ ] ; Dysphagia[ ] ; Melena[ ] ; Hematochezia [ ] ; Heartburn[ ] ; Abdominal pain [ ] ; Constipation [ ] ; Diarrhea [ ] ; BRBPR [ ]   GU: Hematuria[ ] ; Dysuria [ ] ; Nocturia[ ]   Vascular: Pain in legs with walking [ ] ; Pain in feet with lying flat [ ] ; Non-healing sores [ ] ; Stroke [ ] ; TIA [ ] ; Slurred speech [ ] ;  Neuro: Headaches[ ] ; Vertigo[ ] ; Seizures[ ] ; Paresthesias[ ] ;Blurred vision [ ] ; Diplopia [ ] ; Vision changes [ ]   Ortho/Skin: Arthritis [ ] ; Joint pain [ ] ; Muscle pain [ ] ; Joint swelling [ ] ; Back Pain [ ] ; Rash [ ]   Psych: Depression[ ] ; Anxiety[ ]   Heme: Bleeding problems [ ] ; Clotting disorders [ ] ; Anemia [ ]   Endocrine: Diabetes [ ] ; Thyroid dysfunction[ ]    Past Medical History:  Diagnosis Date   Abnormal echocardiogram 08/20/2018   Added automatically from request  for surgery [id number]   Acute blood loss anemia 11/19/2018   Acute hyperglycemia 03/10/2016   Acute on chronic systolic heart failure ([place]) 02/22/2023   Acute post-operative pain 11/19/2018   Aortic valve regurgitation 08/20/2018   Formatting of this note might be different from the original. Added automatically from request for surgery [id number]   Ascending aortic aneurysm ([place])    ASD (atrial septal defect) 02/03/2023   Benign essential hypertension 08/09/2017   at goal on current meds  Formatting of this note might be different from the  original. at goal on current meds   Cardiomyopathy ([place]) 01/29/2018   Dilated aortic root ([place]) 09/11/2012   Formatting of this note might be different from the original. 4.6 cm   Elevated blood-pressure reading without diagnosis of hypertension 01/29/2018   will monitor  Formatting of this note might be different from the original. will monitor   Expressive aphasia 11/20/2018   Hiatal hernia 01/29/2018   History of atrial fibrillation 11/20/2018   History of CVA (cerebrovascular accident) 08/09/2017   History of migraine 01/29/2018   Hyperlipidemia 01/08/2019   Hypertension 01/08/2019   Hypoglycemia 01/29/2018   dietary info  Formatting of this note might be different from the original. dietary info   Malnutrition of moderate degree 03/01/2023   Medicare annual wellness visit, subsequent 02/17/2019   Mixed dyslipidemia 08/09/2017   Nonrheumatic aortic valve insufficiency    Nonrheumatic mitral valve regurgitation 03/01/2023   Other abnormal glucose 01/29/2018   PFO (patent foramen ovale) 09/11/2012   Postoperative atrial fibrillation ([place]) 11/20/2018   S/P ascending aortic aneurysm repair 11/20/2018   S/P AVR 11/19/2018   Stroke (cerebrum) ([place]) - Left MCA branchStroke in a patient with history of prior strokes and mild residual deficits, s/p tPA 07/13/2017   Stroke [facility])    Vitamin D deficiency 01/29/2018    Current Outpatient Medications  Medication Sig Dispense Refill   amiodarone (PACERONE) 200 MG tablet Take 2 tablets (400 mg total) by mouth 2 (two) times daily for 5 doses. Beginning 3/16 take 1 tablet (200mg ) once daily. 38 tablet 0   amiodarone (PACERONE) 200 MG tablet Take 1 tablet (200 mg total) by mouth daily. 30 tablet 4   apixaban (ELIQUIS) 5 MG TABS tablet Take 1 tablet (5 mg total) by mouth 2 (two) times daily. 60 tablet 6   Coenzyme Q10 (COQ10 PO) Take 1 capsule by mouth daily.     dapagliflozin propanediol (FARXIGA) 5 MG TABS tablet Take 1 tablet (5 mg total) by  mouth daily before breakfast. 90 tablet 1   digoxin (LANOXIN) 0.125 MG tablet Take 1 tablet (0.125 mg total) by mouth daily. 30 tablet 5   furosemide (LASIX) 40 MG tablet Take 1 tablet (40 mg total) by mouth daily. 30 tablet 5   midodrine (PROAMATINE) 10 MG tablet Take 1 tablet (10 mg total) by mouth 3 (three) times daily with meals. 90 tablet 5   Multiple Vitamin (MULTIVITAMIN) tablet Take 1 tablet by mouth daily.     Omega-3 Fatty Acids (FISH OIL PO) Take 1 capsule by mouth daily.     potassium chloride SA (KLOR-CON M) 20 MEQ tablet Take 1 tablet (20 mEq total) by mouth daily. 30 tablet 5   rosuvastatin (CRESTOR) 5 MG tablet Take 1 tablet by mouth once daily 90 tablet 1   spironolactone (ALDACTONE) 50 MG tablet Take 1 tablet (50 mg total) by mouth daily. 30 tablet 5   traZODone (DESYREL) 50 MG tablet Take 1 tablet (50  mg total) by mouth at bedtime as needed for sleep. (Patient not taking: Reported on 03/15/2023) 30 tablet 3   No current facility-administered medications for this visit.    Allergies  Allergen Reactions   Penicillin G Rash    Pt states she is not allergic      Social History   Socioeconomic History   Marital status: Married    Spouse name: Not on file   Number of children: Not on file   Years of education: Not on file   Highest education level: Not on file  Occupational History   Not on file  Tobacco Use   Smoking status: Never   Smokeless tobacco: Never  Vaping Use   Vaping Use: Never used  Substance and Sexual Activity   Alcohol use: No   Drug use: No   Sexual activity: Not on file  Other Topics Concern   Not on file  Social History Narrative   Not on file   Social Determinants of Health   Financial Resource Strain: Not on file  Food Insecurity: No Food Insecurity (10/05/2022)   Hunger Vital Sign    Worried About Running Out of Food in the Last Year: Never true    Ran Out of Food in the Last Year: Never true  Transportation Needs: No  Transportation Needs (10/05/2022)   PRAPARE - Hydrologist (Medical): No    Lack of Transportation (Non-Medical): No  Physical Activity: Not on file  Stress: Not on file  Social Connections: Not on file  Intimate Partner Violence: Not on file      Family History  Problem Relation Age of Onset   Hypertension Mother    Heart disease Father    Heart attack Father    Heart disease Brother     There were no vitals filed for this visit.  PHYSICAL EXAM: General:  Well appearing. No respiratory difficulty HEENT: normal Neck: supple. no JVD. Carotids 2+ bilat; no bruits. No lymphadenopathy or thryomegaly appreciated. Cor: PMI nondisplaced. Regular rate & rhythm. No rubs, gallops or murmurs. Lungs: clear Abdomen: soft, nontender, nondistended. No hepatosplenomegaly. No bruits or masses. Good bowel sounds. Extremities: no cyanosis, clubbing, rash, edema Neuro: alert & oriented x 3, cranial nerves grossly intact. moves all 4 extremities w/o difficulty. Affect pleasant.  ECG:   ASSESSMENT & PLAN: 1.  Shock --> Suspect Cardiogenic -Most recent ECHO EF 45-50% at [facility]. Will need to repeat. Concerned EF will be worse due to ongoing A fib RVR.  Persistent hypotension with elevated lactic acid. CO2 27 SCAI C. Advanced Heart Failure Team Consulted  Lactic acid clearing 3.4>1.4  RHC elevated filling pressures CO 5.3 CI 2.8  - TEE 3/7 with EF 30-35%, moderate RV dysfunction, severe MR - Now off pressors.   -CVP 7/8. Continue PO Lasix 40 mg daily  -Continue spiro 50 mg daily - Continue farxiga 10 mg daily - Continue midodrine 10 mg tid.  -Renal function stable.    2. A fib RVR  - Maintaining SR.  - Continue PO amiodarone taper, on 400 mg bid. Plan for 200 daily to start 3/16 - On eliquis 5 mg twice a day.    3. Acute Hypoxic Respiratory Failure ,  - improved w/ diuresis    4. MR  - TEE 3/7 severe functional MR with noncoaptation of the mitral valve  leaflets.  No clear flail or prolapse. Severe TR, +PFO. Moderately reduced biventricular function - d/w structural heart team. Anatomy  likely not favorable for clip. Not candidate for surgery    5.  Iron Deficiency Anemia  -Recent GI bleed 01/2023  -2024 EGD/Colonoscopy with diverticulosis.  -Hgb stable 9.1  -No obvious source.  -Fe 18, Tsat 4. Treated w/ IV Fe    6. CAD 2019 Non obstructive CAD-->10% left main, <25% mid LAD, <25% RCA lesion.  - No chest pain.    7. H/O Bioprosthetic AVR 2019    8. DMII  -Hgb A1C 8 -Continue SSI    9. H/O CVA    10. Hypokalemia - K repleted.    11. NSVT/ PMVT  - On oral amio taper.  - Keep K > 4.0 and Mg > 2.0    12. [place] - She told staff multiple times that she wants to die.  - Palliative Care appreciated. Now DNR, plan for d/c home with [facility] and palliative care serviced. No hospice for now.

## 2023-03-21 ENCOUNTER — Encounter (HOSPITAL_COMMUNITY): Payer: Self-pay

## 2023-03-21 ENCOUNTER — Ambulatory Visit (HOSPITAL_COMMUNITY)
Admit: 2023-03-21 | Discharge: 2023-03-21 | Disposition: A | Discharge status: Home / Self Care | Payer: PPO | Attending: Family Medicine | Admitting: Family Medicine

## 2023-03-21 VITALS — BP 144/90 | HR 89 | Wt 159.0 lb

## 2023-03-21 DIAGNOSIS — Z7984 Long term (current) use of oral hypoglycemic drugs: Secondary | ICD-10-CM | POA: Diagnosis not present

## 2023-03-21 DIAGNOSIS — Z7189 Other specified counseling: Secondary | ICD-10-CM

## 2023-03-21 DIAGNOSIS — E785 Hyperlipidemia, unspecified: Secondary | ICD-10-CM | POA: Insufficient documentation

## 2023-03-21 DIAGNOSIS — Z8673 Personal history of transient ischemic attack (TIA), and cerebral infarction without residual deficits: Secondary | ICD-10-CM | POA: Diagnosis not present

## 2023-03-21 DIAGNOSIS — Z8679 Personal history of other diseases of the circulatory system: Secondary | ICD-10-CM | POA: Diagnosis not present

## 2023-03-21 DIAGNOSIS — I110 Hypertensive heart disease with heart failure: Secondary | ICD-10-CM | POA: Insufficient documentation

## 2023-03-21 DIAGNOSIS — I340 Nonrheumatic mitral (valve) insufficiency: Secondary | ICD-10-CM

## 2023-03-21 DIAGNOSIS — D62 Acute posthemorrhagic anemia: Secondary | ICD-10-CM | POA: Insufficient documentation

## 2023-03-21 DIAGNOSIS — K5791 Diverticulosis of intestine, part unspecified, without perforation or abscess with bleeding: Secondary | ICD-10-CM | POA: Insufficient documentation

## 2023-03-21 DIAGNOSIS — Z7901 Long term (current) use of anticoagulants: Secondary | ICD-10-CM | POA: Diagnosis not present

## 2023-03-21 DIAGNOSIS — Z8719 Personal history of other diseases of the digestive system: Secondary | ICD-10-CM | POA: Diagnosis not present

## 2023-03-21 DIAGNOSIS — I083 Combined rheumatic disorders of mitral, aortic and tricuspid valves: Secondary | ICD-10-CM | POA: Insufficient documentation

## 2023-03-21 DIAGNOSIS — D509 Iron deficiency anemia, unspecified: Secondary | ICD-10-CM | POA: Diagnosis not present

## 2023-03-21 DIAGNOSIS — I2510 Atherosclerotic heart disease of native coronary artery without angina pectoris: Secondary | ICD-10-CM | POA: Diagnosis not present

## 2023-03-21 DIAGNOSIS — Z79899 Other long term (current) drug therapy: Secondary | ICD-10-CM | POA: Diagnosis not present

## 2023-03-21 DIAGNOSIS — I480 Paroxysmal atrial fibrillation: Secondary | ICD-10-CM | POA: Diagnosis not present

## 2023-03-21 DIAGNOSIS — Z953 Presence of xenogenic heart valve: Secondary | ICD-10-CM | POA: Diagnosis not present

## 2023-03-21 DIAGNOSIS — I4729 Other ventricular tachycardia: Secondary | ICD-10-CM

## 2023-03-21 DIAGNOSIS — I472 Ventricular tachycardia, unspecified: Secondary | ICD-10-CM | POA: Diagnosis not present

## 2023-03-21 DIAGNOSIS — E119 Type 2 diabetes mellitus without complications: Secondary | ICD-10-CM | POA: Insufficient documentation

## 2023-03-21 DIAGNOSIS — I5022 Chronic systolic (congestive) heart failure: Secondary | ICD-10-CM | POA: Diagnosis not present

## 2023-03-21 DIAGNOSIS — Z66 Do not resuscitate: Secondary | ICD-10-CM | POA: Insufficient documentation

## 2023-03-21 DIAGNOSIS — I351 Nonrheumatic aortic (valve) insufficiency: Secondary | ICD-10-CM

## 2023-03-21 LAB — BASIC METABOLIC PANEL
Anion gap: 13 (ref 5–15)
BUN: 19 mg/dL (ref 8–23)
CO2: 27 mmol/L (ref 22–32)
Calcium: 9.4 mg/dL (ref 8.9–10.3)
Chloride: 95 mmol/L — ABNORMAL LOW (ref 98–111)
Creatinine, Ser: 1 mg/dL (ref 0.44–1.00)
GFR, Estimated: >60 mL/min (ref >60)
Glucose, Bld: 95 mg/dL (ref 70–99)
Potassium: 4.4 mmol/L (ref 3.5–5.1)
Sodium: 135 mmol/L (ref 135–145)

## 2023-03-21 LAB — CBC
HCT: 38 % (ref 36.0–46.0)
Hemoglobin: 11.1 g/dL — ABNORMAL LOW (ref 12.0–15.0)
MCH: 24.7 pg — ABNORMAL LOW (ref 26.0–34.0)
MCHC: 29.2 g/dL — ABNORMAL LOW (ref 30.0–36.0)
MCV: 84.4 fL (ref 80.0–100.0)
Platelets: 266 10*3/uL (ref 150–400)
RBC: 4.5 MIL/uL (ref 3.87–5.11)
RDW: 27.7 % — ABNORMAL HIGH (ref 11.5–15.5)
WBC: 3.9 10*3/uL — ABNORMAL LOW (ref 4.0–10.5)
nRBC: 0 % (ref 0.0–0.2)

## 2023-03-21 LAB — BRAIN NATRIURETIC PEPTIDE: B Natriuretic Peptide: 301.9 pg/mL — ABNORMAL HIGH (ref 0.0–100.0)

## 2023-03-21 MED ORDER — MIDODRINE HCL 5 MG PO TABS: 5.0000 mg | ORAL_TABLET | Freq: Three times a day (TID) | ORAL | 0 refills | Status: AC

## 2023-03-21 NOTE — Patient Instructions (Addendum)
Thank you for coming in today  Labs were done today, if any labs are abnormal the clinic will call you No news is good news  Medications: Decrease midodrine to 5 mg 1 tablets 3 times daily for 2 weeks then STOP medication  Follow up appointments:  Your physician recommends that you schedule a follow-up appointment in:  3 weeks with pharmacy [place] 6 weeks in clinic 12 weeks with Dr. Daniel Nones with echocardiogram  Your physician has requested that you have an echocardiogram. Echocardiography is a painless test that uses sound waves to create images of your heart. It provides your doctor with information about the size and shape of your heart and how well your heart's chambers and valves are working. This procedure takes approximately one hour. There are no restrictions for this procedure.       Do the following things EVERYDAY: Weigh yourself in the morning before breakfast. Write it down and keep it in a log. Take your medicines as prescribed Eat low salt foods--Limit salt (sodium) to 2000 mg per day.  Stay as active as you can everyday Limit all fluids for the day to less than 2 liters   At the [facility], you and your health needs are our priority. As part of our continuing mission to provide you with exceptional heart care, we have created designated Provider Care Teams. These Care Teams include your primary Cardiologist (physician) and Advanced Practice Providers (APPs- Physician Assistants and Nurse Practitioners) who all work together to provide you with the care you need, when you need it.   You may see any of the following providers on your designated Care Team at your next follow up: Dr Glori Bickers Dr Loralie Champagne Dr. Roxana Hires, NP Lyda Jester, [facility], [place], [place] Forestine Na, NP Audry Riles, PharmD   Please be sure to bring in all your medications bottles to  every appointment.    Thank you for choosing [facility]  If you have any questions or concerns before your next appointment please send [place] a message through Green or call our office at [phone].    TO LEAVE A MESSAGE FOR THE NURSE SELECT OPTION 2, PLEASE LEAVE A MESSAGE INCLUDING: YOUR NAME DATE OF BIRTH CALL BACK NUMBER REASON FOR CALL**this is important as we prioritize the call backs  YOU WILL RECEIVE A CALL BACK THE SAME DAY AS LONG AS YOU CALL BEFORE 4:00 PM

## 2023-03-23 ENCOUNTER — Ambulatory Visit: Payer: PPO | Admitting: Cardiology

## 2023-04-05 ENCOUNTER — Other Ambulatory Visit (HOSPITAL_COMMUNITY): Payer: Self-pay | Admitting: *Deleted

## 2023-04-05 MED ORDER — POTASSIUM CHLORIDE CRYS ER 20 MEQ PO TBCR: 20.0000 meq | EXTENDED_RELEASE_TABLET | Freq: Every day | ORAL | 5 refills | Status: DC

## 2023-04-05 MED ORDER — AMIODARONE HCL 200 MG PO TABS: 200.0000 mg | ORAL_TABLET | Freq: Every day | ORAL | 4 refills | Status: DC

## 2023-04-05 MED ORDER — DAPAGLIFLOZIN PROPANEDIOL 5 MG PO TABS: 5.0000 mg | ORAL_TABLET | Freq: Every day | ORAL | 6 refills | Status: DC

## 2023-04-05 MED ORDER — FUROSEMIDE 40 MG PO TABS: 40.0000 mg | ORAL_TABLET | Freq: Every day | ORAL | 5 refills | Status: DC

## 2023-04-05 MED ORDER — SPIRONOLACTONE 50 MG PO TABS: 50.0000 mg | ORAL_TABLET | Freq: Every day | ORAL | 5 refills | Status: DC

## 2023-04-05 MED ORDER — DIGOXIN 125 MCG PO TABS: 0.1250 mg | ORAL_TABLET | Freq: Every day | ORAL | 5 refills | Status: DC

## 2023-04-08 ENCOUNTER — Other Ambulatory Visit (HOSPITAL_COMMUNITY): Payer: Self-pay

## 2023-04-09 ENCOUNTER — Other Ambulatory Visit (HOSPITAL_COMMUNITY): Payer: Self-pay

## 2023-04-09 ENCOUNTER — Telehealth (HOSPITAL_COMMUNITY): Payer: Self-pay

## 2023-04-09 ENCOUNTER — Ambulatory Visit (HOSPITAL_COMMUNITY)
Admission: RE | Admit: 2023-04-09 | Discharge: 2023-04-09 | Disposition: A | Discharge status: Home / Self Care | Payer: PPO | Source: Ambulatory Visit | Attending: Family Medicine | Admitting: Family Medicine

## 2023-04-09 VITALS — BP 132/68 | HR 83 | Wt 158.4 lb

## 2023-04-09 DIAGNOSIS — Z7985 Long-term (current) use of injectable non-insulin antidiabetic drugs: Secondary | ICD-10-CM | POA: Diagnosis not present

## 2023-04-09 DIAGNOSIS — I480 Paroxysmal atrial fibrillation: Secondary | ICD-10-CM | POA: Insufficient documentation

## 2023-04-09 DIAGNOSIS — E119 Type 2 diabetes mellitus without complications: Secondary | ICD-10-CM | POA: Insufficient documentation

## 2023-04-09 DIAGNOSIS — Z79899 Other long term (current) drug therapy: Secondary | ICD-10-CM | POA: Insufficient documentation

## 2023-04-09 DIAGNOSIS — I5022 Chronic systolic (congestive) heart failure: Secondary | ICD-10-CM | POA: Diagnosis present

## 2023-04-09 DIAGNOSIS — D509 Iron deficiency anemia, unspecified: Secondary | ICD-10-CM | POA: Insufficient documentation

## 2023-04-09 DIAGNOSIS — Z953 Presence of xenogenic heart valve: Secondary | ICD-10-CM | POA: Insufficient documentation

## 2023-04-09 DIAGNOSIS — I2510 Atherosclerotic heart disease of native coronary artery without angina pectoris: Secondary | ICD-10-CM | POA: Diagnosis not present

## 2023-04-09 DIAGNOSIS — I110 Hypertensive heart disease with heart failure: Secondary | ICD-10-CM | POA: Insufficient documentation

## 2023-04-09 DIAGNOSIS — Z8673 Personal history of transient ischemic attack (TIA), and cerebral infarction without residual deficits: Secondary | ICD-10-CM | POA: Diagnosis not present

## 2023-04-09 DIAGNOSIS — Z7901 Long term (current) use of anticoagulants: Secondary | ICD-10-CM | POA: Diagnosis not present

## 2023-04-09 DIAGNOSIS — E785 Hyperlipidemia, unspecified: Secondary | ICD-10-CM | POA: Diagnosis not present

## 2023-04-09 MED ORDER — LOSARTAN POTASSIUM 25 MG PO TABS: 12.5000 mg | ORAL_TABLET | Freq: Every day | ORAL | 6 refills | Status: DC

## 2023-04-09 MED ORDER — DAPAGLIFLOZIN PROPANEDIOL 10 MG PO TABS: 10.0000 mg | ORAL_TABLET | Freq: Every day | ORAL | 6 refills | Status: DC

## 2023-04-09 NOTE — Progress Notes (Signed)
Advanced Heart Failure Clinic Note  Primary Care: Hadley Pen, MD Primary Cardiologist: Garwin Brothers, MD HF Cardiologist: Dr. Gasper Lloyd  HPI:  Stacy Jenkins is a 62 y.o.with a history of thoracic aneurysm 2017, bioprosthetic aortic valve, HLD, HTN. PAF, GI bleed, diverticulosis, CVA, chronic systolic HF.     Echocardiogram in 2019 showed severe aortic regurgitation with EF down to 35 to 40% with inferior septal akinesis.  Subsequent left and right heart cath at [facility] on 09/11/2018 demonstrated 10% left main, <25% mid LAD, <25% RCA lesion. Patient ultimately underwent bioprosthetic AVR on 11/18/2018 with 21 mm Sorin Perceval rapid deployment sutureless stented bovine pericardial valve.  Preoperative CT did mention a focal outpouching on the medial side of ascending aorta up to 1.2 cm. She had postoperative atrial fibrillation requiring amiodarone and Eliquis.    Hospitalized at [facility] 02/03/23 with acute blood loss anemia. Transferred to [facility]. Hgb 4.8. Transfused 3UPRBC. EGD + Colonoscopy severe diverticulosis. Eliquis was restarted at discharge.    Saw Dr Senaida Lange 02/21/23. EKG showed A flutter.  Set up for TEE/Cardioversion. Presented for scheduled TEE/DC-CV on 02/22/23 but was cancelled due to acute respiratory failure. Admitted 02/22/23 with A fib RVR and acute respiratory failure. Placed on Bipap and diltiazem. Cardiology consulted, diuresed with IV lasix. Echo showed EF 25-30%, RV moderately reduced, 4+ torrential MR. RHC showed RA 15, PA 60/31(40), PCWP 30>50, CO/CI (Fick) 5.3/2.8. Started on NE & amiodarone gtts and continued IV Lasix. Eventually converted to SR. PMT consulted and patient elected to be DNR and transition home with [facility] and palliative services; not ready for hospice. Had long run of PMVT spontaneously broke, amiodarone gtt increased to 60. 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 Not MitraClip candidate with significant LV dilation and morphology of mitral valve leaflets. Drips weaned, GDMT titrated. She was discharged home, weight 177.5 lbs.   She returned to HF clinic on 03/21/23 for post hospital HF follow up. Overall was feeling fine. SOB when walking further distances on flat ground. Was doing ok with ADLs. Denied palpitations, abnormal bleeding, CP, dizziness, edema, or PND/Orthopnea. Appetite was ok. Weight at home 159-160 pounds. Endorsed medication adherence.   Today she returns to HF clinic for pharmacist medication titration. At last visit with APP, patient was instructed to decrease midodrine to 5 mg TID for two weeks and then discontinue. She is accompanied today by her husband for the visit. Most of the history is provided by him. Overall feeling better. Denies dizziness, lightheadedness, fatigue. Now off midodrine and doing well. Denies chest pain or palpitations. Reported BP 100-110s systolic at home. BP is 132/68 in clinic. Denies SOB. Able to complete all ADLs. Uses a cane when she is out of the house. Weight at home 158 pounds. Takes furosemide 40 mg daily. Trace LEE on exam. Not using any compression stockings. Denies PND/Orthopnea. Appetite has improved, but not quite at baseline. She is working to limit her salt intake.  HF Medications: Spironolactone 50 mg daily Farxiga 5 mg daily Digoxin 0.125 mg daily  Furosemide 40 mg daily Potassium chloride 20 mEq daily  Has the patient been experiencing any side effects to the medications prescribed?  No  Does the patient have any problems obtaining medications due to transportation or finances?   [employer] Medicare; enrolled and obtained [employer] Grant in clinic today for Farxiga. Income too high to qualify for BMS  PAP for Eliquis.   Understanding of regimen: fair. Her husband manages her medications. Understanding of indications: poor Potential of compliance:  good Patient understands to avoid NSAIDs. Patient understands to avoid decongestants.    Pertinent Lab Values: 03/21/23: Serum creatinine 1.0, BUN 19, Potassium 4.4, Sodium 135, BNP 301.9  Vital Signs: Weight: 158.4 lbs (last clinic weight: 159 lbs) Blood pressure: 132/68 mmHg Heart rate: 83  bpm  Assessment/Plan: 1. Chronic Systolic Heart Failure - Echo (at [facility]): EF 45-50% - Echo (02/2023): EF 25-30%, in setting of AF RVR - RHC (02/2023): elevated filling pressures CO 5.3 CI 2.8  - TEE (02/2023): EF 30-35%, moderate RV dysfunction, severe MR - NYHA II, trace lower extremity edema on exam with no significant respiratory symptoms. - Continue furosemide 40 mg daily. - Initiate losartan 12.5 mg daily at bedtime. Repeat BMET in 3 weeks.  - Continue spironolactone 50 mg daily. - Increase Farxiga to 10 mg daily. Educated on how to use the [employer]. - Continue digoxin 0.125 mg daily. Unable to draw a digoxin trough today since she took digoxin <6 hours prior to visit.  - Plan for BMET and digoxin trough at next visit. Patient educated to hold AM dose of digoxin prior to appointment - MD planning to repeat echo in 3 months when GDMT optimized. -Recommended to bring in BP cuff to next visit to validate home readings with clinic readings.    2. A fib RVR  - NSR on ECG 03/21/23.  - Continue amiodarone 200 mg daily. - Continue Eliquis 5 mg BID.   3. MR  - TEE (03/01/23): severe functional MR with noncoaptation of the mitral valve leaflets.  No clear flail or prolapse. Severe TR, +PFO. Moderately reduced biventricular function - Has been previously d/w structural heart team. Anatomy likely not favorable for clip. Not candidate for surgery. - Medically manage    4.  Iron Deficiency Anemia  - Recent GI bleed 01/2023  - EGD/Colonoscopy (2024) with diverticulosis.  - Fe 18, Tsat 4. Treated w/ IV Fe during admission   5. CAD - Cath (2019): Non obstructive CAD-->10% left main, <25% mid  LAD, <25% RCA lesion.  - No chest pain.  - No ASA with Eliquis - Continue statin.   6. H/o Bioprosthetic AVR 2019 - Will need lifelong SBE prophylaxis    7. DMII  - Hgb A1C 5.7 on 07/2022 - Continue SGLT2i   8. H/o CVA    9. NSVT/PMVT  - Continue amiodarone 200 mg daily   10. GOC - She elected to be DNR during admission. - She has HH and palliative care, no hospice for now.  - HH with [facility].  Follow up 05/02/23 in HF APP clinic  Irish Elders, PharmD PGY-1 [facility] Pharmacy Resident  Karle Plumber, PharmD, BCPS, [facility], CPP Heart Failure Clinic Pharmacist [phone]

## 2023-04-09 NOTE — Patient Instructions (Addendum)
It was a pleasure seeing you today!  MEDICATIONS: -We are changing your medications today -Increase Farxiga to 10 mg once daily -Start losartan 12.5 mg (one-half tablet) once daily at bedtime  -Call if you have questions about your medications.  LABS: -We will call you if your labs need attention.  NEXT APPOINTMENT: Return to clinic on 05/02/23 with Heart Failure Clinic. Do not take your digoxin that morning so we can draw a level for this medication.   In general, to take care of your heart failure: -Limit your fluid intake to 2 Liters (half-gallon) per day.   -Limit your salt intake to ideally 2-3 grams (2000-3000 mg) per day. -Weigh yourself daily and record, and bring that "weight diary" to your next appointment.  (Weight gain of 2-3 pounds in 1 day typically means fluid weight.) -The medications for your heart are to help your heart and help you live longer.   -Please contact us before stopping any of your heart medications.  Call the clinic at [phone] with questions or to reschedule future appointments.

## 2023-04-09 NOTE — Telephone Encounter (Signed)
Advanced Heart Failure Patient Advocate Encounter  The patient was approved for a [employer] grant that will help cover the cost of Digoxin, Farxiga, Spironolactone.  Total amount awarded, $10,000.  Effective: 03/10/2023 - 03/08/2024.  BIN [id number] PCN [id number] Group [id number] ID [id number]  New prescription(s) sent to [address] [facility]. Patient provided with approval and processing information in office.  Burnell Blanks, CPhT Rx Patient Advocate Phone: [phone]

## 2023-04-10 ENCOUNTER — Inpatient Hospital Stay (HOSPITAL_COMMUNITY): Admission: RE | Admit: 2023-04-10 | Payer: PPO | Source: Ambulatory Visit

## 2023-04-16 ENCOUNTER — Encounter: Payer: Self-pay | Admitting: Cardiology

## 2023-04-16 ENCOUNTER — Ambulatory Visit: Payer: PPO | Attending: Cardiology | Admitting: Cardiology

## 2023-04-16 VITALS — BP 101/66 | HR 73 | Ht 66.0 in | Wt 156.4 lb

## 2023-04-16 DIAGNOSIS — E782 Mixed hyperlipidemia: Secondary | ICD-10-CM | POA: Diagnosis not present

## 2023-04-16 DIAGNOSIS — I10 Essential (primary) hypertension: Secondary | ICD-10-CM

## 2023-04-16 DIAGNOSIS — Z952 Presence of prosthetic heart valve: Secondary | ICD-10-CM

## 2023-04-16 DIAGNOSIS — Z9889 Other specified postprocedural states: Secondary | ICD-10-CM | POA: Diagnosis not present

## 2023-04-16 DIAGNOSIS — I4891 Unspecified atrial fibrillation: Secondary | ICD-10-CM

## 2023-04-16 DIAGNOSIS — I9789 Other postprocedural complications and disorders of the circulatory system, not elsewhere classified: Secondary | ICD-10-CM

## 2023-04-16 DIAGNOSIS — Z8679 Personal history of other diseases of the circulatory system: Secondary | ICD-10-CM

## 2023-04-16 NOTE — Addendum Note (Signed)
Addended by: Eleonore Chiquito on: 04/16/2023 11:21 AM   Modules accepted: Orders

## 2023-04-16 NOTE — Patient Instructions (Signed)
Medication Instructions:  Your physician has recommended you make the following change in your medication:   Decrease your Amiodarone to 100 mg daily.  *If you need a refill on your cardiac medications before your next appointment, please call your pharmacy*   Lab Work: Your physician recommends that you have a CMP and Dig level.  If you have labs (blood work) drawn today and your tests are completely normal, you will receive your results only by: MyChart Message (if you have MyChart) OR A paper copy in the mail If you have any lab test that is abnormal or we need to change your treatment, we will call you to review the results.   Testing/Procedures: None ordered   Follow-Up: At [facility], you and your health needs are our priority.  As part of our continuing mission to provide you with exceptional heart care, we have created designated Provider Care Teams.  These Care Teams include your primary Cardiologist (physician) and Advanced Practice Providers (APPs -  Physician Assistants and Nurse Practitioners) who all work together to provide you with the care you need, when you need it.  We recommend signing up for the patient portal called "MyChart".  Sign up information is provided on this After Visit Summary.  MyChart is used to connect with patients for Virtual Visits (Telemedicine).  Patients are able to view lab/test results, encounter notes, upcoming appointments, etc.  Non-urgent messages can be sent to your provider as well.   To learn more about what you can do with MyChart, go to [email or web address].    Your next appointment:   4 month(s)  The format for your next appointment:   In Person  Provider:   Belva Crome, MD    Other Instructions none  Important Information About Sugar

## 2023-04-16 NOTE — Progress Notes (Signed)
Cardiology Office Note:    Date:  04/16/2023   ID:  Stacy Jenkins, DOB 1961/04/05, MRN [id number]  PCP:  Hadley Pen, MD  Cardiologist:  Garwin Brothers, MD   Referring MD: Hadley Pen, MD    ASSESSMENT:    1. S/P AVR   2. S/P ascending aortic aneurysm repair   3. Mixed dyslipidemia   4. Benign essential hypertension   5. Postoperative atrial fibrillation    PLAN:    In order of problems listed above:  Primary prevention stressed with patient.  Importance of compliance with diet medication stressed and she vocalized understanding. Paroxysmal atrial fibrillation:I discussed with the patient atrial fibrillation, disease process. Management and therapy including rate and rhythm control, anticoagulation benefits and potential risks were discussed extensively with the patient. Patient had multiple questions which were answered to patient's satisfaction. Amiodarone therapy: I reduced amiodarone to 100 mg today.  Will do baseline blood work.  Will also do this level since she has not taken her medicine this morning.   Post aortic valve replacement: Stable at this time. Cardiomyopathy: CHF education given.  She checks her weights on a regular basis.  Weights are stable.  Diet emphasized.  Salt intake issues discussed. Patient will be seen in follow-up appointment in 6 months or earlier if the patient has any concerns.    Medication Adjustments/Labs and Tests Ordered: Current medicines are reviewed at length with the patient today.  Concerns regarding medicines are outlined above.  No orders of the defined types were placed in this encounter.  No orders of the defined types were placed in this encounter.    No chief complaint on file.    History of Present Illness:    Stacy Jenkins is a 62 y.o. female.  Patient has past medical history of aortic valve replacement, aortic root repair for aneurysm, history of CVA, atrial fibrillation, essential hypertension,  congestive heart failure.  Severe mitral regurgitation.  Last TEE revealed ejection fraction of 30 to 35% severely dilated left atrium.  She denies any problems at this time and takes care of activities of daily living.  No chest pain orthopnea or PND.  At the time of my evaluation, the patient is alert awake oriented and in no distress.  Past Medical History:  Diagnosis Date   Abnormal echocardiogram 08/20/2018   Added automatically from request for surgery [id number]   Acute blood loss anemia 11/19/2018   Acute hyperglycemia 03/10/2016   Acute on chronic systolic heart failure 02/22/2023   Acute post-operative pain 11/19/2018   Aortic valve regurgitation 08/20/2018   Formatting of this note might be different from the original. Added automatically from request for surgery [id number]   Ascending aortic aneurysm    ASD (atrial septal defect) 02/03/2023   Benign essential hypertension 08/09/2017   at goal on current meds  Formatting of this note might be different from the original. 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 4.6 cm   Elevated blood-pressure reading without diagnosis of hypertension 01/29/2018   will monitor  Formatting of this note might be different from the original. will monitor   Expressive aphasia 11/20/2018   Hiatal hernia 01/29/2018   History of atrial fibrillation 11/20/2018   History of CVA (cerebrovascular accident) 08/09/2017   History of migraine 01/29/2018   Hyperlipidemia 01/08/2019   Hypertension 01/08/2019   Hypoglycemia 01/29/2018   dietary  info  Formatting of this note might be different from the original. dietary info   Malnutrition of moderate degree 03/01/2023   Medicare annual wellness visit, subsequent 02/17/2019   Mixed dyslipidemia 08/09/2017   Nonrheumatic aortic valve insufficiency    Nonrheumatic mitral valve regurgitation 03/01/2023    Other abnormal glucose 01/29/2018   PFO (patent foramen ovale) 09/11/2012   Postoperative atrial fibrillation 11/20/2018   S/P ascending aortic aneurysm repair 11/20/2018   S/P AVR 11/19/2018   Stroke    Stroke (cerebrum) (HCC) - Left MCA branchStroke in a patient with history of prior strokes and mild residual deficits, s/p tPA 07/13/2017   Vitamin D deficiency 01/29/2018    Past Surgical History:  Procedure Laterality Date   aortic valve surgery  02/2016   RIGHT HEART CATH N/A 02/26/2023   Procedure: RIGHT HEART CATH;  Surgeon: Dorthula Nettles, DO;  Location: [facility] INVASIVE CV LAB;  Service: Cardiovascular;  Laterality: N/A;   TEE WITHOUT CARDIOVERSION N/A 07/16/2017   Procedure: TRANSESOPHAGEAL ECHOCARDIOGRAM (TEE);  Surgeon: Thurmon Fair, MD;  Location: [facility] ENDOSCOPY;  Service: Cardiovascular;  Laterality: N/A;   TEE WITHOUT CARDIOVERSION N/A 03/01/2023   Procedure: TRANSESOPHAGEAL ECHOCARDIOGRAM (TEE);  Surgeon: Jodelle Red, MD;  Location: [facility] ENDOSCOPY;  Service: Cardiovascular;  Laterality: N/A;    Current Medications: Current Meds  Medication Sig   amiodarone (PACERONE) 200 MG tablet Take 1 tablet (200 mg total) by mouth daily.   apixaban (ELIQUIS) 5 MG TABS tablet Take 1 tablet (5 mg total) by mouth 2 (two) times daily.   dapagliflozin propanediol (FARXIGA) 10 MG TABS tablet Take 1 tablet (10 mg total) by mouth daily before breakfast.   digoxin (LANOXIN) 0.125 MG tablet Take 1 tablet (0.125 mg total) by mouth daily.   furosemide (LASIX) 40 MG tablet Take 1 tablet (40 mg total) by mouth daily.   losartan (COZAAR) 25 MG tablet Take 0.5 tablets (12.5 mg total) by mouth at bedtime.   rosuvastatin (CRESTOR) 5 MG tablet Take 1 tablet by mouth once daily   spironolactone (ALDACTONE) 50 MG tablet Take 1 tablet (50 mg total) by mouth daily.     Allergies:   Penicillin g   Social History   Socioeconomic History   Marital status: Married    Spouse name: Not on file    Number of children: Not on file   Years of education: Not on file   Highest education level: Not on file  Occupational History   Not on file  Tobacco Use   Smoking status: Never   Smokeless tobacco: Never  Vaping Use   Vaping Use: Never used  Substance and Sexual Activity   Alcohol use: No   Drug use: No   Sexual activity: Not on file  Other Topics Concern   Not on file  Social History Narrative   Not on file   Social Determinants of Health   Financial Resource Strain: Not on file  Food Insecurity: No Food Insecurity (10/05/2022)   Hunger Vital Sign    Worried About Running Out of Food in the Last Year: Never true    Ran Out of Food in the Last Year: Never true  Transportation Needs: No Transportation Needs (10/05/2022)   PRAPARE - Administrator, Civil Service (Medical): No    Lack of Transportation (Non-Medical): No  Physical Activity: Not on file  Stress: Not on file  Social Connections: Not on file     Family History: The patient's family history includes Heart attack  in her father; Heart disease in her brother and father; Hypertension in her mother.  ROS:   Please see the history of present illness.    All other systems reviewed and are negative.  EKGs/Labs/Other Studies Reviewed:    The following studies were reviewed today: EKG reveals sinus rhythm T wave inversions in lateral leads.   Recent Labs: 08/04/2022: TSH 2.980 02/21/2023: NT-Pro BNP 4,604 02/26/2023: ALT 200 03/08/2023: Magnesium 2.3 03/21/2023: B Natriuretic Peptide 301.9; BUN 19; Creatinine, Ser 1.00; Hemoglobin 11.1; Platelets 266; Potassium 4.4; Sodium 135  Recent Lipid Panel    Component Value Date/Time   CHOL 167 08/04/2022 1028   TRIG 93 08/04/2022 1028   HDL 68 08/04/2022 1028   CHOLHDL 2.5 08/04/2022 1028   CHOLHDL 2.7 07/14/2017 0241   VLDL 19 07/14/2017 0241   LDLCALC 82 08/04/2022 1028    Physical Exam:    VS:  BP 101/66   Pulse 73   Ht  (1.676 m)   Wt 156  lb 6.4 oz (70.9 kg)   SpO2 95%   BMI 25.24 kg/m     Wt Readings from Last 3 Encounters:  04/16/23 156 lb 6.4 oz (70.9 kg)  04/09/23 158 lb 6.4 oz (71.8 kg)  03/21/23 159 lb (72.1 kg)     GEN: Patient is in no acute distress HEENT: Normal NECK: No JVD; No carotid bruits LYMPHATICS: No lymphadenopathy CARDIAC: Hear sounds regular, 2/6 systolic murmur at the apex. RESPIRATORY:  Clear to auscultation without rales, wheezing or rhonchi  ABDOMEN: Soft, non-tender, non-distended MUSCULOSKELETAL:  No edema; No deformity  SKIN: Warm and dry NEUROLOGIC:  Alert and oriented x 3 PSYCHIATRIC:  Normal affect   Signed, Garwin Brothers, MD  04/16/2023 11:06 AM     [facility]

## 2023-04-17 LAB — COMPREHENSIVE METABOLIC PANEL
ALT: 22 IU/L (ref 0–32)
AST: 25 IU/L (ref 0–40)
Albumin/Globulin Ratio: 1.4 (ref 1.2–2.2)
Albumin: 4.2 g/dL (ref 3.9–4.9)
Alkaline Phosphatase: 113 IU/L (ref 44–121)
BUN/Creatinine Ratio: 22 (ref 12–28)
BUN: 22 mg/dL (ref 8–27)
Bilirubin Total: 0.5 mg/dL (ref 0.0–1.2)
CO2: 23 mmol/L (ref 20–29)
Calcium: 9.5 mg/dL (ref 8.7–10.3)
Chloride: 102 mmol/L (ref 96–106)
Creatinine, Ser: 1 mg/dL (ref 0.57–1.00)
Globulin, Total: 3 g/dL (ref 1.5–4.5)
Glucose: 89 mg/dL (ref 70–99)
Potassium: 4.1 mmol/L (ref 3.5–5.2)
Sodium: 140 mmol/L (ref 134–144)
Total Protein: 7.2 g/dL (ref 6.0–8.5)
eGFR: 63 mL/min/{1.73_m2} (ref >59)

## 2023-04-17 LAB — DIGOXIN LEVEL: Digoxin, Serum: 1 ng/mL — ABNORMAL HIGH (ref 0.5–0.9)

## 2023-05-02 ENCOUNTER — Encounter (HOSPITAL_COMMUNITY): Payer: PPO

## 2023-05-03 ENCOUNTER — Ambulatory Visit (HOSPITAL_COMMUNITY)
Admission: RE | Admit: 2023-05-03 | Discharge: 2023-05-03 | Disposition: A | Discharge status: Home / Self Care | Payer: PPO | Source: Ambulatory Visit | Attending: Internal Medicine | Admitting: Internal Medicine

## 2023-05-03 ENCOUNTER — Encounter (HOSPITAL_COMMUNITY): Payer: Self-pay

## 2023-05-03 VITALS — BP 122/80 | HR 73 | Wt 155.6 lb

## 2023-05-03 DIAGNOSIS — K5730 Diverticulosis of large intestine without perforation or abscess without bleeding: Secondary | ICD-10-CM | POA: Diagnosis not present

## 2023-05-03 DIAGNOSIS — I340 Nonrheumatic mitral (valve) insufficiency: Secondary | ICD-10-CM | POA: Diagnosis not present

## 2023-05-03 DIAGNOSIS — Z79899 Other long term (current) drug therapy: Secondary | ICD-10-CM | POA: Diagnosis not present

## 2023-05-03 DIAGNOSIS — I472 Ventricular tachycardia, unspecified: Secondary | ICD-10-CM | POA: Insufficient documentation

## 2023-05-03 DIAGNOSIS — E785 Hyperlipidemia, unspecified: Secondary | ICD-10-CM | POA: Diagnosis not present

## 2023-05-03 DIAGNOSIS — I4892 Unspecified atrial flutter: Secondary | ICD-10-CM | POA: Insufficient documentation

## 2023-05-03 DIAGNOSIS — Z7901 Long term (current) use of anticoagulants: Secondary | ICD-10-CM | POA: Diagnosis not present

## 2023-05-03 DIAGNOSIS — E119 Type 2 diabetes mellitus without complications: Secondary | ICD-10-CM | POA: Diagnosis not present

## 2023-05-03 DIAGNOSIS — I083 Combined rheumatic disorders of mitral, aortic and tricuspid valves: Secondary | ICD-10-CM | POA: Diagnosis not present

## 2023-05-03 DIAGNOSIS — Z8673 Personal history of transient ischemic attack (TIA), and cerebral infarction without residual deficits: Secondary | ICD-10-CM | POA: Diagnosis not present

## 2023-05-03 DIAGNOSIS — I480 Paroxysmal atrial fibrillation: Secondary | ICD-10-CM | POA: Insufficient documentation

## 2023-05-03 DIAGNOSIS — Z8679 Personal history of other diseases of the circulatory system: Secondary | ICD-10-CM | POA: Diagnosis not present

## 2023-05-03 DIAGNOSIS — I110 Hypertensive heart disease with heart failure: Secondary | ICD-10-CM | POA: Insufficient documentation

## 2023-05-03 DIAGNOSIS — Z66 Do not resuscitate: Secondary | ICD-10-CM | POA: Insufficient documentation

## 2023-05-03 DIAGNOSIS — I2510 Atherosclerotic heart disease of native coronary artery without angina pectoris: Secondary | ICD-10-CM | POA: Diagnosis not present

## 2023-05-03 DIAGNOSIS — I4891 Unspecified atrial fibrillation: Secondary | ICD-10-CM

## 2023-05-03 DIAGNOSIS — I5022 Chronic systolic (congestive) heart failure: Secondary | ICD-10-CM

## 2023-05-03 DIAGNOSIS — Z953 Presence of xenogenic heart valve: Secondary | ICD-10-CM | POA: Insufficient documentation

## 2023-05-03 DIAGNOSIS — D509 Iron deficiency anemia, unspecified: Secondary | ICD-10-CM | POA: Diagnosis not present

## 2023-05-03 DIAGNOSIS — Z952 Presence of prosthetic heart valve: Secondary | ICD-10-CM

## 2023-05-03 LAB — BRAIN NATRIURETIC PEPTIDE: B Natriuretic Peptide: 95 pg/mL (ref 0.0–100.0)

## 2023-05-03 LAB — BASIC METABOLIC PANEL
Anion gap: 9 (ref 5–15)
BUN: 21 mg/dL (ref 8–23)
CO2: 26 mmol/L (ref 22–32)
Calcium: 8.9 mg/dL (ref 8.9–10.3)
Chloride: 101 mmol/L (ref 98–111)
Creatinine, Ser: 1.19 mg/dL — ABNORMAL HIGH (ref 0.44–1.00)
GFR, Estimated: 51 mL/min — ABNORMAL LOW (ref >60)
Glucose, Bld: 99 mg/dL (ref 70–99)
Potassium: 4 mmol/L (ref 3.5–5.1)
Sodium: 136 mmol/L (ref 135–145)

## 2023-05-03 LAB — CBC
HCT: 40.8 % (ref 36.0–46.0)
Hemoglobin: 12.5 g/dL (ref 12.0–15.0)
MCH: 26.4 pg (ref 26.0–34.0)
MCHC: 30.6 g/dL (ref 30.0–36.0)
MCV: 86.3 fL (ref 80.0–100.0)
Platelets: 180 10*3/uL (ref 150–400)
RBC: 4.73 MIL/uL (ref 3.87–5.11)
RDW: 21.3 % — ABNORMAL HIGH (ref 11.5–15.5)
WBC: 3.7 10*3/uL — ABNORMAL LOW (ref 4.0–10.5)
nRBC: 0 % (ref 0.0–0.2)

## 2023-05-03 LAB — TSH: TSH: 4.933 u[IU]/mL — ABNORMAL HIGH (ref 0.350–4.500)

## 2023-05-03 MED ORDER — METOPROLOL SUCCINATE ER 25 MG PO TB24: 12.5000 mg | ORAL_TABLET | Freq: Every evening | ORAL | 2 refills | Status: DC

## 2023-05-03 NOTE — Patient Instructions (Signed)
Medication Changes:  START Metroprolol XL 12.5 mg (half a tablet) Nightly    *If you need a refill on your cardiac medications before your next appointment, please call your pharmacy*  Lab Work:  Labs done today, your results will be available in MyChart, we will contact you for abnormal readings.   Follow-Up in:   Your physician recommends that you schedule a follow-up appointment in: 3 months    Do the following things EVERYDAY: Weigh yourself in the morning before breakfast. Write it down and keep it in a log. Take your medicines as prescribed Eat low salt foods--Limit salt (sodium) to 2000 mg per day.  Stay as active as you can everyday Limit all fluids for the day to less than 2 liters    Need to Contact us:  If you have any questions or concerns before your next appointment please send [place] a message through [place] or call our office at [phone].    TO LEAVE A MESSAGE FOR THE NURSE SELECT OPTION 2, PLEASE LEAVE A MESSAGE INCLUDING: YOUR NAME DATE OF BIRTH CALL BACK NUMBER REASON FOR CALL**this is important as we prioritize the call backs  YOU WILL RECEIVE A CALL BACK THE SAME DAY AS LONG AS YOU CALL BEFORE 4:00 PM   At the [facility], you and your health needs are our priority. As part of our continuing mission to provide you with exceptional heart care, we have created designated Provider Care Teams. These Care Teams include your primary Cardiologist (physician) and Advanced Practice Providers (APPs- Physician Assistants and Nurse Practitioners) who all work together to provide you with the care you need, when you need it.   You may see any of the following providers on your designated Care Team at your next follow up: Dr Arvilla Meres Dr Marca Ancona Dr. Marcos Eke, NP Robbie Lis, [place] [facility] [place], [place] Brynda Peon, NP Karle Plumber, PharmD   Please be sure to bring in all your medications bottles  to every appointment.    Thank you for choosing [facility]

## 2023-05-03 NOTE — Progress Notes (Signed)
ADVANCED HF CLINIC NOTE  Primary Care: Hadley Pen, MD Primary Cardiologist: Garwin Brothers, MD HF Cardiologist: Dr. Gasper Lloyd  HPI: Stacy Jenkins is a 62 y.o.with a history of thoracic aneurysm 2017, bioprosthetic aortic valve, DMII, HLD, HTN. PAF,  GI bleed, diverticulosis, chronic systolic HF.     Echocardiogram in 2019 showed severe aortic regurgitation with EF down to 35 to 40% with inferior septal akinesis.  Subsequent left and right heart cath at [facility] on 09/11/2018 demonstrated 10% left main, <25% mid LAD, <25% RCA lesion. Patient ultimately underwent bioprosthetic AVR on 11/18/2018 with 21 mm Sorin Perceval rapid deployment sutureless stented bovine pericardial valve.  Preoperative CT did mention a focal outpouching on the medial side of ascending aorta up to 1.2 cm.  She had postoperative atrial fibrillation requiring amiodarone and Eliquis.    Hospitalized at [facility] 02/03/23 with acute blood loss anemia. Transferred to [facility]. Hgb 4.8. Transfused 3UPRBC. EGD + Colonoscopy severe diverticulosis. Eliquis was restarted at discharge.    Saw Dr Senaida Lange 02/21/23. EKG showed A flutter.  Set up for TEE/Cardioversion. Presented for scheduled TEE/DC-CV on 02/22/23 but was cancelled due to acute respiratory failure. Admitted 02/22/23 with A fib RVR and acute respiratory failure. Placed on Bipap and diltiazem. Cardiology consulted, diuresed with IV lasix. Echo showed EF 25-30%, RV moderately reduced, 4+ torrential MR. RHC showed RA 15, PA 60/31(40), PCWP 30>50, CO/CI (Fick) 5.3 /2.8. Started on NE & amoi gtts and continued IV lasix. Eventually back in SR. PMT consulted and patient elected to be DNR and transition home with [facility] and palliative services; not ready for hospice. Had long run of PMVT spontaneously broke, amio gtt increased to 60. 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 Not MitraClip candidate with significant LV dilation and morphology of mitral valve leaflets. Drips weaned, GDMT titrated. She was discharged home, weight 177.5 lbs.  Today she returns for AHF follow up with her husband. Overall feeling good. Denies palpitations, CP, dizziness, edema, or PND/Orthopnea. No SOB. Appetite very good but is watching how much she eats. No fever or chills. Intermittently tired at home. Weight at home 155-156 pounds. Taking all medications.  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 - RHC (3/24): RA 15, PA 60/31(40), PCWP 30>50, CO/CI (Fick) 5.3 /2.8. - Echo (3/24): EF 25-30%, RV moderately reduced, 4+ torrential MR. - R/LHC (9/19): at [facility]; 10% left main, <25% mid LAD, <25% RCA lesion.  - Echo (2019): EF 35-40%, with inferior septal akinesis, severe aortic regurgitation.  Past Medical History:  Diagnosis Date   Abnormal echocardiogram 08/20/2018   Added automatically from request for surgery [id number]   Acute blood loss anemia 11/19/2018   Acute hyperglycemia 03/10/2016   Acute on chronic systolic heart failure (HCC) 02/22/2023   Acute post-operative pain 11/19/2018   Aortic valve regurgitation 08/20/2018   Formatting of this note might be different from the original. Added automatically from request for surgery [id number]   Ascending aortic aneurysm (HCC)    ASD (atrial septal defect) 02/03/2023   Benign essential hypertension 08/09/2017   at goal on current meds  Formatting of this note might be different from the original. at goal on current meds   Cardiomyopathy (HCC) 01/29/2018   Dilated aortic root (HCC) 09/11/2012   Formatting of this note might  be different from the original. 4.6 cm   Elevated blood-pressure reading without diagnosis of hypertension 01/29/2018   will monitor  Formatting of this note might be different from the  original. will monitor   Expressive aphasia 11/20/2018   Hiatal hernia 01/29/2018   History of atrial fibrillation 11/20/2018   History of CVA (cerebrovascular accident) 08/09/2017   History of migraine 01/29/2018   Hyperlipidemia 01/08/2019   Hypertension 01/08/2019   Hypoglycemia 01/29/2018   dietary info  Formatting of this note might be different from the original. dietary info   Malnutrition of moderate degree 03/01/2023   Medicare annual wellness visit, subsequent 02/17/2019   Mixed dyslipidemia 08/09/2017   Nonrheumatic aortic valve insufficiency    Nonrheumatic mitral valve regurgitation 03/01/2023   Other abnormal glucose 01/29/2018   PFO (patent foramen ovale) 09/11/2012   Postoperative atrial fibrillation (HCC) 11/20/2018   S/P ascending aortic aneurysm repair 11/20/2018   S/P AVR 11/19/2018   Stroke (cerebrum) (HCC) - Left MCA branchStroke in a patient with history of prior strokes and mild residual deficits, s/p tPA 07/13/2017   Stroke (HCC)    Vitamin D deficiency 01/29/2018    Current Outpatient Medications  Medication Sig Dispense Refill   amiodarone (PACERONE) 200 MG tablet Take 1 tablet (200 mg total) by mouth daily. 30 tablet 4   apixaban (ELIQUIS) 5 MG TABS tablet Take 1 tablet (5 mg total) by mouth 2 (two) times daily. 60 tablet 6   digoxin (LANOXIN) 0.125 MG tablet Take 1 tablet (0.125 mg total) by mouth daily. 30 tablet 5   furosemide (LASIX) 40 MG tablet Take 1 tablet (40 mg total) by mouth daily. 30 tablet 5   losartan (COZAAR) 25 MG tablet Take 0.5 tablets (12.5 mg total) by mouth at bedtime. 15 tablet 6   rosuvastatin (CRESTOR) 5 MG tablet Take 1 tablet by mouth once daily 90 tablet 1   spironolactone (ALDACTONE) 50 MG tablet Take 1 tablet (50 mg total) by mouth daily. 30 tablet 5   No current facility-administered medications for this encounter.   Allergies  Allergen Reactions   Penicillin G Rash    Pt states she is not allergic   Social History    Socioeconomic History   Marital status: Married    Spouse name: Not on file   Number of children: Not on file   Years of education: Not on file   Highest education level: Not on file  Occupational History   Not on file  Tobacco Use   Smoking status: Never   Smokeless tobacco: Never  Vaping Use   Vaping Use: Never used  Substance and Sexual Activity   Alcohol use: No   Drug use: No   Sexual activity: Not on file  Other Topics Concern   Not on file  Social History Narrative   Not on file   Social Determinants of Health   Financial Resource Strain: Not on file  Food Insecurity: No Food Insecurity (10/05/2022)   Hunger Vital Sign    Worried About Running Out of Food in the Last Year: Never true    Ran Out of Food in the Last Year: Never true  Transportation Needs: No Transportation Needs (10/05/2022)   PRAPARE - Administrator, Civil Service (Medical): No    Lack of Transportation (Non-Medical): No  Physical Activity: Not on file  Stress: Not on file  Social Connections: Not on file  Intimate Partner Violence: Not on file   Family History  Problem Relation Age of Onset   Hypertension Mother    Heart disease Father    Heart attack Father    Heart disease Brother    BP 122/80   Pulse 73   Wt 70.6 kg (155 lb 9.6 oz)   SpO2 95%   BMI 25.11 kg/m   Wt Readings from Last 3 Encounters:  05/03/23 70.6 kg (155 lb 9.6 oz)  04/16/23 70.9 kg (156 lb 6.4 oz)  04/09/23 71.8 kg (158 lb 6.4 oz)   PHYSICAL EXAM: General:  well appearing.  No respiratory difficulty. Walked into clinic HEENT: normal Neck: supple. JVD ~8 cm. Carotids 2+ bilat; no bruits. No lymphadenopathy or thyromegaly appreciated. Cor: PMI nondisplaced. Regular rate & rhythm. No rubs, gallops or murmurs. Lungs: clear Abdomen: soft, nontender, nondistended. No hepatosplenomegaly. No bruits or masses. Good bowel sounds. Extremities: no cyanosis, clubbing, rash, trace BLE edema  Neuro: alert &  oriented x 3, cranial nerves grossly intact. moves all 4 extremities w/o difficulty. Affect pleasant.   ECG (personally reviewed):No EKG today  ASSESSMENT & PLAN: 1.  Chronic Systolic Heart Failure - Echo (at [facility]): EF 45-50% - Echo (3/24): EF 25-30%, in setting of AF RVR - RHC (3/24): elevated filling pressures CO 5.3 CI 2.8  - TEE (3/24): EF 30-35%, moderate RV dysfunction, severe MR - NYHA II-early III, volume stable today. - Continue Lasix 40 mg daily. - Stable off midodrine  - Continue losartan 12.5 mg QHS - Continue digoxin 0.125 mg M-F. Was taken off weekend dose recently with Dig level at 1. Will recheck dig level in 1 week with repeat BMET (instructed not to take dig that morning) - Continue spiro 50 mg daily. - Continue Farxiga 10 mg daily. No GU symptoms. - Start metoprolol 12.5 mg QHS - BMET today, repeat in 10 days - ordered compression socks - Plan to repeat echo in 3 months once GDMT optimized.   2. Paroxysmal A fib, h/o RVR  - Continue amiodarone 100 mg daily. - Continue Eliquis 5 mg bid. - CBC, TSH, T3, T4 today - LFTs stable 4/24: AST 25 ALT 22   3. MR  - TEE (03/01/23): severe functional MR with noncoaptation of the mitral valve leaflets.  No clear flail or prolapse. Severe TR, +PFO. Moderately reduced biventricular function - d/w structural heart team. Anatomy likely not favorable for clip. Not candidate for surgery. - Medically manage    4.  Iron Deficiency Anemia  - Recent GI bleed 01/2023  - EGD/Colonoscopy (2024) with diverticulosis.  - Fe 18, Tsat 4. Treated w/ IV Fe during admission - CBC today   5. CAD - Cath (2019): Non obstructive CAD-->10% left main, <25% mid LAD, <25% RCA lesion.  - No chest pain.  - No ASA with Eliquis - Continue statin.   6. H/o Bioprosthetic AVR 2019 - Valve stable on echo 3/24 - Will need lifelong SBE prophylaxis .   7. DMII  - Hgb A1C 8 - On SGLT2i  8. H/o CVA    9. NSVT/ PMVT  - Continue amiodarone  - Labs  today.   10. GOC - She elected to be DNR during admission. - She has HH and palliative care, no hospice for now.  - HH with [facility].  Follow up in 3 months with Dr. Gasper Lloyd + echo.  Brynda Peon, AGACNP-BC  05/03/23

## 2023-05-04 ENCOUNTER — Telehealth: Payer: Self-pay | Admitting: *Deleted

## 2023-05-04 ENCOUNTER — Other Ambulatory Visit: Payer: Self-pay

## 2023-05-04 DIAGNOSIS — I5022 Chronic systolic (congestive) heart failure: Secondary | ICD-10-CM

## 2023-05-04 DIAGNOSIS — Z79899 Other long term (current) drug therapy: Secondary | ICD-10-CM

## 2023-05-04 DIAGNOSIS — I4891 Unspecified atrial fibrillation: Secondary | ICD-10-CM

## 2023-05-04 DIAGNOSIS — I340 Nonrheumatic mitral (valve) insufficiency: Secondary | ICD-10-CM

## 2023-05-04 DIAGNOSIS — I2510 Atherosclerotic heart disease of native coronary artery without angina pectoris: Secondary | ICD-10-CM

## 2023-05-04 NOTE — Telephone Encounter (Signed)
Pt walked in for BMP and Digoxin level per Brynda Peon, NP. Order put in didn't work, put in new order and printed out for [employer]

## 2023-05-05 LAB — T4: T4, Total: 9 ug/dL (ref 4.5–12.0)

## 2023-05-05 LAB — T3: T3, Total: 88 ng/dL (ref 71–180)

## 2023-05-09 LAB — BASIC METABOLIC PANEL
BUN/Creatinine Ratio: 17 (ref 12–28)
BUN: 19 mg/dL (ref 8–27)
CO2: 23 mmol/L (ref 20–29)
Calcium: 9.6 mg/dL (ref 8.7–10.3)
Chloride: 100 mmol/L (ref 96–106)
Creatinine, Ser: 1.11 mg/dL — ABNORMAL HIGH (ref 0.57–1.00)
Glucose: 87 mg/dL (ref 70–99)
Potassium: 4.2 mmol/L (ref 3.5–5.2)
Sodium: 141 mmol/L (ref 134–144)
eGFR: 56 mL/min/{1.73_m2} — ABNORMAL LOW (ref >59)

## 2023-05-09 LAB — DIGOXIN LEVEL: Digoxin, Serum: 0.9 ng/mL (ref 0.5–0.9)

## 2023-05-15 ENCOUNTER — Telehealth (HOSPITAL_COMMUNITY): Payer: Self-pay

## 2023-05-15 NOTE — Telephone Encounter (Signed)
Patient's husband called and stated they completed labs yesterday as instructed

## 2023-05-15 NOTE — Telephone Encounter (Signed)
-----   Message from Alen Bleacher, NP sent at 05/10/2023  7:32 AM EDT ----- Repeat labs ordered. Please make sure she doesn't take her Digoxin the morning she comes for BMET/Digoxin labs

## 2023-07-27 ENCOUNTER — Other Ambulatory Visit: Payer: Self-pay | Admitting: Cardiology

## 2023-08-02 ENCOUNTER — Encounter (HOSPITAL_COMMUNITY): Payer: PPO | Admitting: Cardiology

## 2023-08-02 ENCOUNTER — Ambulatory Visit (HOSPITAL_COMMUNITY): Payer: PPO

## 2023-08-15 ENCOUNTER — Telehealth: Payer: Self-pay | Admitting: Cardiology

## 2023-08-15 NOTE — Telephone Encounter (Signed)
Follow Up:   I meant Friday(8-23--24)

## 2023-08-15 NOTE — Telephone Encounter (Signed)
New Message:     Patient was unable to have her Echo last week, because her husband was in the hospital.Husband needs to know, since she did not have her Echo, does she need her appointment on Friday(08-19-23) to see Dr Tomie China?

## 2023-08-17 ENCOUNTER — Telehealth: Payer: Self-pay

## 2023-08-17 ENCOUNTER — Encounter: Payer: Self-pay | Admitting: Cardiology

## 2023-08-17 ENCOUNTER — Ambulatory Visit: Payer: PPO | Attending: Cardiology | Admitting: Cardiology

## 2023-08-17 VITALS — BP 100/60 | HR 70 | Ht 67.6 in | Wt 154.8 lb

## 2023-08-17 DIAGNOSIS — Z8679 Personal history of other diseases of the circulatory system: Secondary | ICD-10-CM

## 2023-08-17 DIAGNOSIS — E782 Mixed hyperlipidemia: Secondary | ICD-10-CM | POA: Diagnosis not present

## 2023-08-17 DIAGNOSIS — I429 Cardiomyopathy, unspecified: Secondary | ICD-10-CM

## 2023-08-17 DIAGNOSIS — Z9889 Other specified postprocedural states: Secondary | ICD-10-CM

## 2023-08-17 DIAGNOSIS — I10 Essential (primary) hypertension: Secondary | ICD-10-CM

## 2023-08-17 DIAGNOSIS — I5023 Acute on chronic systolic (congestive) heart failure: Secondary | ICD-10-CM | POA: Diagnosis not present

## 2023-08-17 DIAGNOSIS — Z131 Encounter for screening for diabetes mellitus: Secondary | ICD-10-CM

## 2023-08-17 DIAGNOSIS — E559 Vitamin D deficiency, unspecified: Secondary | ICD-10-CM

## 2023-08-17 DIAGNOSIS — Z952 Presence of prosthetic heart valve: Secondary | ICD-10-CM | POA: Diagnosis not present

## 2023-08-17 NOTE — Addendum Note (Signed)
Addended by: Eleonore Chiquito on: 08/17/2023 04:56 PM   Modules accepted: Orders

## 2023-08-17 NOTE — Telephone Encounter (Signed)
Patient seen and put in for a 6 month follow up/kbl 08/17/23

## 2023-08-17 NOTE — Telephone Encounter (Signed)
Medication list updated in EPIC. Please review and make changes.

## 2023-08-17 NOTE — Progress Notes (Signed)
Cardiology Office Note:    Date:  08/17/2023   ID:  Stacy Jenkins, DOB 1961/01/29, MRN [id number]  PCP:  Hadley Pen, MD  Cardiologist:  Garwin Brothers, MD   Referring MD: Hadley Pen, MD    ASSESSMENT:    1. Mixed dyslipidemia   2. Acute on chronic systolic heart failure (HCC)   3. Cardiomyopathy, unspecified type (HCC)   4. S/P AVR   5. S/P ascending aortic aneurysm repair   6. Benign essential hypertension   7. Vitamin D deficiency   8. Diabetes mellitus screening    PLAN:    In order of problems listed above:  Primary prevention stressed with the patient.  Importance of compliance with diet medication stressed and patient verbalized standing. Advanced cardiomyopathy: Stable at this time.  She has missed her echo appointment.  Her husband mentions to me that he has appointment coming up at [facility] soon and will get an echocardiogram done there and send me a copy.  She also plans to come back in the next few days to [place] to complete her blood work. Essential hypertension: Blood pressure stable and diet was emphasized.  Husband will call us when he reaches home about medications and will titrate them.  I want to reduce one of her medications because of borderline blood pressure and fatigue. Mixed dyslipidemia: On lipid-lowering medications and followed by primary care. Paroxysmal atrial fibrillation:I discussed with the patient atrial fibrillation, disease process. Management and therapy including rate and rhythm control, anticoagulation benefits and potential risks were discussed extensively with the patient. Patient had multiple questions which were answered to patient's satisfaction. Amiodarone therapy: Benefits and potential risks explained and questions were answered to her satisfaction.   Medication Adjustments/Labs and Tests Ordered: Current medicines are reviewed at length with the patient today.  Concerns regarding medicines are outlined above.  Orders Placed  This Encounter  Procedures   CBC   Comprehensive metabolic panel   Hemoglobin A1c   Lipid panel   TSH   VITAMIN D 25 Hydroxy (Vit-D Deficiency, Fractures)   EKG 12-Lead   No orders of the defined types were placed in this encounter.    No chief complaint on file.    History of Present Illness:    Stacy Jenkins is a 62 y.o. female.  Patient has past medical history of aortic valve replacement, aortic aneurysm repair, history of stroke, paroxysmal atrial fibrillation, essential hypertension and advanced cardiomyopathy.  She has severe mitral regurgitation.  She denies any problems at this time and takes care of activities of daily living.  No chest pain orthopnea or PND.  She ambulates in a steady manner.  Past Medical History:  Diagnosis Date   Abnormal echocardiogram 08/20/2018   Added automatically from request for surgery [id number]   Acute blood loss anemia 11/19/2018   Acute hyperglycemia 03/10/2016   Acute on chronic systolic heart failure (HCC) 02/22/2023   Acute post-operative pain 11/19/2018   Aortic valve regurgitation 08/20/2018   Formatting of this note might be different from the original. Added automatically from request for surgery [id number]   Ascending aortic aneurysm (HCC)    ASD (atrial septal defect) 02/03/2023   Benign essential hypertension 08/09/2017   at goal on current meds  Formatting of this note might be different from the original. at goal on current meds   Cardiomyopathy (HCC) 01/29/2018   Dilated aortic root (HCC) 09/11/2012   Formatting of this note might be different from the original. 4.6 cm  Elevated blood-pressure reading without diagnosis of hypertension 01/29/2018   will monitor  Formatting of this note might be different from the original. will monitor   Expressive aphasia 11/20/2018   Hiatal hernia 01/29/2018   History of atrial fibrillation 11/20/2018   History of CVA (cerebrovascular accident) 08/09/2017   History of migraine  01/29/2018   Hyperlipidemia 01/08/2019   Hypertension 01/08/2019   Hypoglycemia 01/29/2018   dietary info  Formatting of this note might be different from the original. dietary info   Malnutrition of moderate degree 03/01/2023   Medicare annual wellness visit, subsequent 02/17/2019   Mixed dyslipidemia 08/09/2017   Nonrheumatic aortic valve insufficiency    Nonrheumatic mitral valve regurgitation 03/01/2023   Other abnormal glucose 01/29/2018   PFO (patent foramen ovale) 09/11/2012   Postoperative atrial fibrillation (HCC) 11/20/2018   S/P ascending aortic aneurysm repair 11/20/2018   S/P AVR 11/19/2018   Stroke (cerebrum) (HCC) - Left MCA branchStroke in a patient with history of prior strokes and mild residual deficits, s/p tPA 07/13/2017   Stroke (HCC)    Vitamin D deficiency 01/29/2018    Past Surgical History:  Procedure Laterality Date   aortic valve surgery  02/2016   RIGHT HEART CATH N/A 02/26/2023   Procedure: RIGHT HEART CATH;  Surgeon: Dorthula Nettles, DO;  [facility];  Service: Cardiovascular;  Laterality: N/A;   TEE WITHOUT CARDIOVERSION N/A 07/16/2017   Procedure: TRANSESOPHAGEAL ECHOCARDIOGRAM (TEE);  Surgeon: Thurmon Fair, MD;  Location: [facility] ENDOSCOPY;  Service: Cardiovascular;  Laterality: N/A;   TEE WITHOUT CARDIOVERSION N/A 03/01/2023   Procedure: TRANSESOPHAGEAL ECHOCARDIOGRAM (TEE);  Surgeon: Jodelle Red, MD;  Location: [facility] ENDOSCOPY;  Service: Cardiovascular;  Laterality: N/A;    Current Medications: Current Meds  Medication Sig   amiodarone (PACERONE) 200 MG tablet Take 1 tablet (200 mg total) by mouth daily.   apixaban (ELIQUIS) 5 MG TABS tablet Take 1 tablet (5 mg total) by mouth 2 (two) times daily.   digoxin (LANOXIN) 0.125 MG tablet Take 1 tablet (0.125 mg total) by mouth daily.   furosemide (LASIX) 40 MG tablet Take 1 tablet (40 mg total) by mouth daily.   losartan (COZAAR) 25 MG tablet Take 0.5 tablets (12.5 mg total) by  mouth at bedtime.   metoprolol succinate (TOPROL-XL) 25 MG 24 hr tablet Take 0.5 tablets (12.5 mg total) by mouth at bedtime.   rosuvastatin (CRESTOR) 5 MG tablet Take 1 tablet by mouth once daily   spironolactone (ALDACTONE) 50 MG tablet Take 1 tablet (50 mg total) by mouth daily.     Allergies:   Penicillin g   Social History   Socioeconomic History   Marital status: Married    Spouse name: Not on file   Number of children: Not on file   Years of education: Not on file   Highest education level: Not on file  Occupational History   Not on file  Tobacco Use   Smoking status: Never   Smokeless tobacco: Never  Vaping Use   Vaping status: Never Used  Substance and Sexual Activity   Alcohol use: No   Drug use: No   Sexual activity: Not on file  Other Topics Concern   Not on file  Social History Narrative   Not on file   Social Determinants of Health   Financial Resource Strain: Not on file  Food Insecurity: Low Risk  (02/03/2023)   [facility]   Food vital sign    Within the  past 12 months, you worried that your food would run out before you got money to buy more: Never true    Within the past 12 months, the food you bought just didn't last and you didn't have money to get more: Not on file  Transportation Needs: No Transportation Needs (02/03/2023)   [facility]   Transportation    In the past 12 months, has lack of reliable transportation kept you from medical appointments, meetings, work or from getting things needed for daily living? : No  Physical Activity: Not on file  Stress: Not on file  Social Connections: Not on file     Family History: The patient's family history includes Heart attack in her father; Heart disease in her brother and father; Hypertension in her mother.  ROS:   Please see the history of present illness.    All other systems reviewed and are negative.  EKGs/Labs/Other Studies  Reviewed:    The following studies were reviewed today: .Marland KitchenEKG Interpretation Date/Time:  Friday August 17 2023 14:01:50 EDT Ventricular Rate:  70 PR Interval:  194 QRS Duration:  94 QT Interval:  368 QTC Calculation: 397 R Axis:   88  Text Interpretation: Sinus rhythm with Fusion complexes Cannot rule out Anterior infarct , age undetermined Marked ST abnormality, possible inferior subendocardial injury Abnormal ECG When compared with ECG of 21-Mar-2023 15:14, Significant changes have occurred Confirmed by Belva Crome [phone]) on 08/17/2023 2:06:22 PM     Recent Labs: 02/21/2023: NT-Pro BNP 4,604 03/08/2023: Magnesium 2.3 04/16/2023: ALT 22 05/03/2023: B Natriuretic Peptide 95.0; Hemoglobin 12.5; Platelets 180; TSH 4.933 05/04/2023: BUN 19; Creatinine, Ser 1.11; Potassium 4.2; Sodium 141  Recent Lipid Panel    Component Value Date/Time   CHOL 167 08/04/2022 1028   TRIG 93 08/04/2022 1028   HDL 68 08/04/2022 1028   CHOLHDL 2.5 08/04/2022 1028   CHOLHDL 2.7 07/14/2017 0241   VLDL 19 07/14/2017 0241   LDLCALC 82 08/04/2022 1028    Physical Exam:    VS:  BP 100/60   Pulse 70   Ht 5' 7.6" (1.717 m)   Wt 154 lb 12.8 oz (70.2 kg)   SpO2 94%   BMI 23.82 kg/m     Wt Readings from Last 3 Encounters:  08/17/23 154 lb 12.8 oz (70.2 kg)  05/03/23 155 lb 9.6 oz (70.6 kg)  04/16/23 156 lb 6.4 oz (70.9 kg)     GEN: Patient is in no acute distress HEENT: Normal NECK: No JVD; No carotid bruits LYMPHATICS: No lymphadenopathy CARDIAC: Hear sounds regular, 2/6 systolic murmur at the apex. RESPIRATORY:  Clear to auscultation without rales, wheezing or rhonchi  ABDOMEN: Soft, non-tender, non-distended MUSCULOSKELETAL:  No edema; No deformity  SKIN: Warm and dry NEUROLOGIC:  Alert and oriented x 3 PSYCHIATRIC:  Normal affect   Signed, Garwin Brothers, MD  08/17/2023 2:28 PM    [facility]

## 2023-08-17 NOTE — Patient Instructions (Addendum)
Medication Instructions:  Your physician recommends that you continue on your current medications as directed. Please refer to the Current Medication list given to you today.  *If you need a refill on your cardiac medications before your next appointment, please call your pharmacy*   Lab Work: Your physician recommends that you have a CMP, CBC, TSH, A1C, vitamin D and lipids today in the next few days. You need to fast for these labs.  If you have labs (blood work) drawn today and your tests are completely normal, you will receive your results only by: MyChart Message (if you have MyChart) OR A paper copy in the mail If you have any lab test that is abnormal or we need to change your treatment, we will call you to review the results.   Testing/Procedures: None ordered   Follow-Up: At [facility], you and your health needs are our priority.  As part of our continuing mission to provide you with exceptional heart care, we have created designated Provider Care Teams.  These Care Teams include your primary Cardiologist (physician) and Advanced Practice Providers (APPs -  Physician Assistants and Nurse Practitioners) who all work together to provide you with the care you need, when you need it.  We recommend signing up for the patient portal called "MyChart".  Sign up information is provided on this After Visit Summary.  MyChart is used to connect with patients for Virtual Visits (Telemedicine).  Patients are able to view lab/test results, encounter notes, upcoming appointments, etc.  Non-urgent messages can be sent to your provider as well.   To learn more about what you can do with MyChart, go to [email or web address].    Your next appointment:   6 month(s)  The format for your next appointment:   In Person  Provider:   Belva Crome, MD    Other Instructions none  Important Information About Sugar

## 2023-08-24 MED ORDER — SPIRONOLACTONE 50 MG PO TABS: 25.0000 mg | ORAL_TABLET | Freq: Every day | ORAL | Status: DC

## 2023-08-24 NOTE — Addendum Note (Signed)
Addended by: Eleonore Chiquito on: 08/24/2023 09:37 AM   Modules accepted: Orders

## 2023-08-24 NOTE — Telephone Encounter (Signed)
Left detailed message per DPR with recommendation and to callback.

## 2023-08-25 LAB — COMPREHENSIVE METABOLIC PANEL
ALT: 13 IU/L (ref 0–32)
AST: 19 IU/L (ref 0–40)
Albumin: 4.2 g/dL (ref 3.9–4.9)
Alkaline Phosphatase: 104 IU/L (ref 44–121)
BUN/Creatinine Ratio: 23 (ref 12–28)
BUN: 24 mg/dL (ref 8–27)
Bilirubin Total: 0.6 mg/dL (ref 0.0–1.2)
CO2: 26 mmol/L (ref 20–29)
Calcium: 9.4 mg/dL (ref 8.7–10.3)
Chloride: 101 mmol/L (ref 96–106)
Creatinine, Ser: 1.05 mg/dL — ABNORMAL HIGH (ref 0.57–1.00)
Globulin, Total: 2.8 g/dL (ref 1.5–4.5)
Glucose: 86 mg/dL (ref 70–99)
Potassium: 4.2 mmol/L (ref 3.5–5.2)
Sodium: 139 mmol/L (ref 134–144)
Total Protein: 7 g/dL (ref 6.0–8.5)
eGFR: 60 mL/min/{1.73_m2} (ref >59)

## 2023-08-25 LAB — CBC
Hematocrit: 41.3 % (ref 34.0–46.6)
Hemoglobin: 13.4 g/dL (ref 11.1–15.9)
MCH: 29.5 pg (ref 26.6–33.0)
MCHC: 32.4 g/dL (ref 31.5–35.7)
MCV: 91 fL (ref 79–97)
Platelets: 206 10*3/uL (ref 150–450)
RBC: 4.54 x10E6/uL (ref 3.77–5.28)
RDW: 13.7 % (ref 11.7–15.4)
WBC: 3.5 10*3/uL (ref 3.4–10.8)

## 2023-08-25 LAB — VITAMIN D 25 HYDROXY (VIT D DEFICIENCY, FRACTURES): Vit D, 25-Hydroxy: 28.6 ng/mL — ABNORMAL LOW (ref 30.0–100.0)

## 2023-08-25 LAB — LIPID PANEL
Chol/HDL Ratio: 2.7 ratio (ref 0.0–4.4)
Cholesterol, Total: 176 mg/dL (ref 100–199)
HDL: 65 mg/dL (ref >39)
LDL Chol Calc (NIH): 92 mg/dL (ref 0–99)
Triglycerides: 109 mg/dL (ref 0–149)
VLDL Cholesterol Cal: 19 mg/dL (ref 5–40)

## 2023-08-25 LAB — TSH: TSH: 5.34 u[IU]/mL — ABNORMAL HIGH (ref 0.450–4.500)

## 2023-08-25 LAB — HEMOGLOBIN A1C
Est. average glucose Bld gHb Est-mCnc: 117 mg/dL
Hgb A1c MFr Bld: 5.7 % — ABNORMAL HIGH (ref 4.8–5.6)

## 2023-08-28 NOTE — Telephone Encounter (Signed)
Left VM for Zakhai to call back

## 2023-08-30 ENCOUNTER — Telehealth: Payer: Self-pay

## 2023-08-30 NOTE — Telephone Encounter (Signed)
Left vm to call back

## 2023-08-30 NOTE — Telephone Encounter (Signed)
-----   Message from Garwin Brothers sent at 08/25/2023 10:46 PM EDT ----- The results of the study is unremarkable. Please inform patient. I will discuss in detail at next appointment. Cc  primary care/referring physician Garwin Brothers, MD 08/25/2023 10:46 PM

## 2023-09-17 ENCOUNTER — Other Ambulatory Visit (HOSPITAL_COMMUNITY): Payer: Self-pay | Admitting: Cardiology

## 2023-10-04 ENCOUNTER — Other Ambulatory Visit (HOSPITAL_COMMUNITY): Payer: Self-pay | Admitting: Cardiology

## 2023-10-09 ENCOUNTER — Other Ambulatory Visit (HOSPITAL_COMMUNITY): Payer: Self-pay | Admitting: Cardiology

## 2023-10-15 ENCOUNTER — Other Ambulatory Visit (HOSPITAL_COMMUNITY): Payer: Self-pay | Admitting: Cardiology

## 2023-10-20 ENCOUNTER — Other Ambulatory Visit: Payer: Self-pay | Admitting: Cardiology

## 2023-11-15 ENCOUNTER — Other Ambulatory Visit (HOSPITAL_COMMUNITY): Payer: Self-pay | Admitting: Cardiology

## 2023-12-11 ENCOUNTER — Other Ambulatory Visit (HOSPITAL_COMMUNITY): Payer: Self-pay | Admitting: Cardiology

## 2023-12-12 ENCOUNTER — Other Ambulatory Visit (HOSPITAL_COMMUNITY): Payer: Self-pay | Admitting: Cardiology

## 2023-12-14 ENCOUNTER — Other Ambulatory Visit (HOSPITAL_COMMUNITY): Payer: Self-pay | Admitting: Cardiology

## 2023-12-28 ENCOUNTER — Other Ambulatory Visit (HOSPITAL_COMMUNITY): Payer: Self-pay | Admitting: Cardiology

## 2024-01-01 ENCOUNTER — Other Ambulatory Visit (HOSPITAL_COMMUNITY): Payer: Self-pay | Admitting: Cardiology

## 2024-01-03 ENCOUNTER — Other Ambulatory Visit (HOSPITAL_COMMUNITY): Payer: Self-pay | Admitting: Cardiology

## 2024-01-10 ENCOUNTER — Other Ambulatory Visit (HOSPITAL_COMMUNITY): Payer: Self-pay | Admitting: Cardiology

## 2024-01-11 ENCOUNTER — Encounter: Payer: Self-pay | Admitting: Cardiology

## 2024-02-06 ENCOUNTER — Other Ambulatory Visit (HOSPITAL_COMMUNITY): Payer: Self-pay | Admitting: Cardiology

## 2024-02-14 ENCOUNTER — Ambulatory Visit: Payer: PPO | Admitting: Cardiology

## 2024-02-14 ENCOUNTER — Telehealth: Payer: Self-pay

## 2024-02-14 MED ORDER — AMIODARONE HCL 200 MG PO TABS: 200.0000 mg | ORAL_TABLET | Freq: Every day | ORAL | 1 refills | Status: DC

## 2024-02-14 NOTE — Telephone Encounter (Signed)
Refill of Amiodarone 200 mg sent to [facility] in [place].

## 2024-03-03 ENCOUNTER — Encounter: Payer: Self-pay | Admitting: Cardiology

## 2024-03-03 ENCOUNTER — Ambulatory Visit: Payer: PPO | Attending: Cardiology | Admitting: Cardiology

## 2024-03-03 VITALS — BP 124/70 | HR 71 | Ht 66.0 in | Wt 158.6 lb

## 2024-03-03 DIAGNOSIS — E559 Vitamin D deficiency, unspecified: Secondary | ICD-10-CM

## 2024-03-03 DIAGNOSIS — Z9889 Other specified postprocedural states: Secondary | ICD-10-CM | POA: Diagnosis not present

## 2024-03-03 DIAGNOSIS — E782 Mixed hyperlipidemia: Secondary | ICD-10-CM

## 2024-03-03 DIAGNOSIS — Z952 Presence of prosthetic heart valve: Secondary | ICD-10-CM

## 2024-03-03 DIAGNOSIS — E162 Hypoglycemia, unspecified: Secondary | ICD-10-CM

## 2024-03-03 DIAGNOSIS — I2510 Atherosclerotic heart disease of native coronary artery without angina pectoris: Secondary | ICD-10-CM | POA: Diagnosis not present

## 2024-03-03 DIAGNOSIS — Z8679 Personal history of other diseases of the circulatory system: Secondary | ICD-10-CM

## 2024-03-03 MED ORDER — SPIRONOLACTONE 50 MG PO TABS: 50.0000 mg | ORAL_TABLET | Freq: Every day | ORAL | 3 refills | Status: AC

## 2024-03-03 MED ORDER — AMIODARONE HCL 200 MG PO TABS: 200.0000 mg | ORAL_TABLET | Freq: Every day | ORAL | 3 refills | Status: AC

## 2024-03-03 NOTE — Patient Instructions (Signed)

## 2024-03-03 NOTE — Progress Notes (Signed)
 Cardiology Office Note:    Date:  03/03/2024   ID:  Stacy Jenkins, DOB [birth year], MRN [id number]  PCP:  Hadley Pen, MD  Cardiologist:  Garwin Brothers, MD   Referring MD: Hadley Pen, MD    ASSESSMENT:    1. Coronary artery disease involving native heart without angina pectoris, unspecified vessel or lesion type   2. S/P AVR   3. S/P ascending aortic aneurysm repair   4. Mixed dyslipidemia   5. History of atrial fibrillation    PLAN:    In order of problems listed above:  Coronary artery disease: Secondary prevention stressed to the patient.  Importance of compliance with diet medication stressed and she vocalized understanding.  She was advised to walk at least half an hour a day on a daily basis.  She ambulates with a cane. Paroxysmal atrial fibrillation:I discussed with the patient atrial fibrillation, disease process. Management and therapy including rate and rhythm control, anticoagulation benefits and potential risks were discussed extensively with the patient. Patient had multiple questions which were answered to patient's satisfaction. On amiodarone therapy and tolerating well.  Benefits risks explained and she vocalized understanding and questions were answered to her satisfaction.  She will come for complete blood work in the next few days and we will check her digoxin level, LFTs and TSH among other lab work. Post ascending aortic aneurysm: Stable records from [facility] including MRI was reviewed. Cardiomyopathy: Medical management at this time.  She is on appropriate medications. Post aortic valve replacement: Stable echo report and previous MRI report discussed with her. Patient will be seen in follow-up appointment in 6 months or earlier if the patient has any concerns.    Medication Adjustments/Labs and Tests Ordered: Current medicines are reviewed at length with the patient today.  Concerns regarding medicines are outlined above.  Orders Placed This  Encounter  Procedures   EKG 12-Lead   No orders of the defined types were placed in this encounter.    No chief complaint on file.    History of Present Illness:    Stacy Jenkins is a 63 y.o. female.  Patient has past medical history of paroxysmal fibrillation, mixed dyslipidemia, coronary artery disease, aortic valve replacement, ascending aortic aneurysm repair and aortic valve replacement.  She has a history of stroke.  She ambulates with a cane now.  She denies any chest pain orthopnea or PND.  She takes care of activities of daily living.  At the time of my evaluation, the patient is alert awake oriented and in no distress.  Past Medical History:  Diagnosis Date   Abnormal echocardiogram 08/20/2018   Added automatically from request for surgery [id number]   Acute blood loss anemia 11/19/2018   Acute hyperglycemia 03/10/2016   Acute on chronic systolic heart failure (HCC) 02/22/2023   Acute post-operative pain 11/19/2018   Aortic valve regurgitation 08/20/2018   Formatting of this note might be different from the original. Added automatically from request for surgery [id number]   Ascending aortic aneurysm (HCC)    ASD (atrial septal defect) 02/03/2023   Benign essential hypertension 08/09/2017   at goal on current meds  Formatting of this note might be different from the original. at goal on current meds   Cardiomyopathy (HCC) 01/29/2018   Dilated aortic root (HCC) 09/11/2012   Formatting of this note might be different from the original. 4.6 cm   Elevated blood-pressure reading without diagnosis of hypertension 01/29/2018   will monitor  Formatting of this  note might be different from the original. will monitor   Expressive aphasia 11/20/2018   Hiatal hernia 01/29/2018   History of atrial fibrillation 11/20/2018   History of CVA (cerebrovascular accident) 08/09/2017   History of migraine 01/29/2018   Hyperlipidemia 01/08/2019   Hypertension 01/08/2019   Hypoglycemia  01/29/2018   dietary info  Formatting of this note might be different from the original. dietary info   Malnutrition of moderate degree 03/01/2023   Medicare annual wellness visit, subsequent 02/17/2019   Mixed dyslipidemia 08/09/2017   Nonrheumatic aortic valve insufficiency    Nonrheumatic mitral valve regurgitation 03/01/2023   Other abnormal glucose 01/29/2018   PFO (patent foramen ovale) 09/11/2012   Postoperative atrial fibrillation (HCC) 11/20/2018   S/P ascending aortic aneurysm repair 11/20/2018   S/P AVR 11/19/2018   Stroke (cerebrum) (HCC) - Left MCA branchStroke in a patient with history of prior strokes and mild residual deficits, s/p tPA 07/13/2017   Stroke (HCC)    Vitamin D deficiency 01/29/2018    Past Surgical History:  Procedure Laterality Date   aortic valve surgery  02/2016   RIGHT HEART CATH N/A 02/26/2023   Procedure: RIGHT HEART CATH;  Surgeon: Dorthula Nettles, DO;  Location: [facility] INVASIVE CV LAB;  Service: Cardiovascular;  Laterality: N/A;   TEE WITHOUT CARDIOVERSION N/A 07/16/2017   Procedure: TRANSESOPHAGEAL ECHOCARDIOGRAM (TEE);  Surgeon: Thurmon Fair, MD;  Location: [facility] ENDOSCOPY;  Service: Cardiovascular;  Laterality: N/A;   TEE WITHOUT CARDIOVERSION N/A 03/01/2023   Procedure: TRANSESOPHAGEAL ECHOCARDIOGRAM (TEE);  Surgeon: Jodelle Red, MD;  Location: [facility] ENDOSCOPY;  Service: Cardiovascular;  Laterality: N/A;    Current Medications: Current Meds  Medication Sig   amiodarone (PACERONE) 200 MG tablet Take 1 tablet (200 mg total) by mouth daily.   apixaban (ELIQUIS) 5 MG TABS tablet Take 1 tablet (5 mg total) by mouth 2 (two) times daily.   digoxin (LANOXIN) 0.125 MG tablet Take 1 tablet by mouth once daily   FARXIGA 10 MG TABS tablet TAKE 1 TABLET BY MOUTH ONCE DAILY BEFORE BREAKFAST   furosemide (LASIX) 40 MG tablet TAKE 1 TABLET BY MOUTH ONCE DAILY . APPOINTMENT REQUIRED FOR FUTURE REFILLS   KLOR-CON M20 20 MEQ tablet Take 1 tablet by mouth  once daily   losartan (COZAAR) 25 MG tablet Take 0.5 tablets (12.5 mg total) by mouth daily.   metoprolol succinate (TOPROL-XL) 25 MG 24 hr tablet Take 0.5 tablets (12.5 mg total) by mouth at bedtime.   rosuvastatin (CRESTOR) 5 MG tablet Take 1 tablet (5 mg total) by mouth daily.   spironolactone (ALDACTONE) 50 MG tablet TAKE 1 TABLET BY MOUTH ONCE DAILY . APPOINTMENT REQUIRED FOR FUTURE REFILLS     Allergies:   Penicillin g   Social History   Socioeconomic History   Marital status: Married    Spouse name: Not on file   Number of children: Not on file   Years of education: Not on file   Highest education level: Not on file  Occupational History   Not on file  Tobacco Use   Smoking status: Never   Smokeless tobacco: Never  Vaping Use   Vaping status: Never Used  Substance and Sexual Activity   Alcohol use: No   Drug use: No   Sexual activity: Not on file  Other Topics Concern   Not on file  Social History Narrative   Not on file   Social Drivers of Health   Financial Resource Strain: Not on file  Food Insecurity: Low  Risk  (02/03/2023)   [facility]   Hunger Vital Sign    Worried About Running Out of Food in the Last Year: Never true    Within the past 12 months, the food you bought just didn't last and you didn't have money to get more: Not on file  Transportation Needs: No Transportation Needs (02/03/2023)   [facility]   Transportation    In the past 12 months, has lack of reliable transportation kept you from medical appointments, meetings, work or from getting things needed for daily living? : No  Physical Activity: Not on file  Stress: Not on file  Social Connections: Not on file     Family History: The patient's family history includes Heart attack in her father; Heart disease in her brother and father; Hypertension in her mother.  ROS:   Please see the history of present illness.    All other  systems reviewed and are negative.  EKGs/Labs/Other Studies Reviewed:    The following studies were reviewed today: .Marland KitchenEKG Interpretation Date/Time:  Monday March 03 2024 15:10:49 EDT Ventricular Rate:  71 PR Interval:  172 QRS Duration:  92 QT Interval:  374 QTC Calculation: 406 R Axis:   95  Text Interpretation: Normal sinus rhythm Rightward axis Marked ST abnormality, possible inferior subendocardial injury Abnormal ECG When compared with ECG of 17-Aug-2023 14:01, Fusion complexes are no longer Present Confirmed by Belva Crome [phone]) on 03/03/2024 3:14:17 PM     Recent Labs: 03/08/2023: Magnesium 2.3 05/03/2023: B Natriuretic Peptide 95.0 08/24/2023: ALT 13; BUN 24; Creatinine, Ser 1.05; Hemoglobin 13.4; Platelets 206; Potassium 4.2; Sodium 139; TSH 5.340  Recent Lipid Panel    Component Value Date/Time   CHOL 176 08/24/2023 1043   TRIG 109 08/24/2023 1043   HDL 65 08/24/2023 1043   CHOLHDL 2.7 08/24/2023 1043   CHOLHDL 2.7 07/14/2017 0241   VLDL 19 07/14/2017 0241   LDLCALC 92 08/24/2023 1043    Physical Exam:    VS:  BP 124/70   Pulse 71   Ht 5\' 6"  (1.676 m)   Wt 158 lb 9.6 oz (71.9 kg)   SpO2 95%   BMI 25.60 kg/m     Wt Readings from Last 3 Encounters:  03/03/24 158 lb 9.6 oz (71.9 kg)  08/17/23 154 lb 12.8 oz (70.2 kg)  05/03/23 155 lb 9.6 oz (70.6 kg)     GEN: Patient is in no acute distress HEENT: Normal NECK: No JVD; No carotid bruits LYMPHATICS: No lymphadenopathy CARDIAC: Hear sounds regular, 2/6 systolic murmur at the apex. RESPIRATORY:  Clear to auscultation without rales, wheezing or rhonchi  ABDOMEN: Soft, non-tender, non-distended MUSCULOSKELETAL:  No edema; No deformity  SKIN: Warm and dry NEUROLOGIC:  Alert and oriented x 3 PSYCHIATRIC:  Normal affect   Signed, Garwin Brothers, MD  03/03/2024 3:22 PM    [facility]

## 2024-03-05 ENCOUNTER — Telehealth: Payer: Self-pay

## 2024-03-05 LAB — LIPID PANEL
Chol/HDL Ratio: 2.9 ratio (ref 0.0–4.4)
Cholesterol, Total: 200 mg/dL — ABNORMAL HIGH (ref 100–199)
HDL: 70 mg/dL (ref >39)
LDL Chol Calc (NIH): 104 mg/dL — ABNORMAL HIGH (ref 0–99)
Triglycerides: 150 mg/dL — ABNORMAL HIGH (ref 0–149)
VLDL Cholesterol Cal: 26 mg/dL (ref 5–40)

## 2024-03-05 LAB — VITAMIN D 25 HYDROXY (VIT D DEFICIENCY, FRACTURES): Vit D, 25-Hydroxy: 27.9 ng/mL — ABNORMAL LOW (ref 30.0–100.0)

## 2024-03-05 LAB — CBC
Hematocrit: 46.6 % (ref 34.0–46.6)
Hemoglobin: 15 g/dL (ref 11.1–15.9)
MCH: 30.3 pg (ref 26.6–33.0)
MCHC: 32.2 g/dL (ref 31.5–35.7)
MCV: 94 fL (ref 79–97)
Platelets: 203 10*3/uL (ref 150–450)
RBC: 4.95 x10E6/uL (ref 3.77–5.28)
RDW: 12.2 % (ref 11.7–15.4)
WBC: 3.7 10*3/uL (ref 3.4–10.8)

## 2024-03-05 LAB — COMPREHENSIVE METABOLIC PANEL
ALT: 18 IU/L (ref 0–32)
AST: 31 IU/L (ref 0–40)
Albumin: 4.9 g/dL (ref 3.9–4.9)
Alkaline Phosphatase: 144 IU/L — ABNORMAL HIGH (ref 44–121)
BUN/Creatinine Ratio: 21 (ref 12–28)
BUN: 24 mg/dL (ref 8–27)
Bilirubin Total: 0.9 mg/dL (ref 0.0–1.2)
CO2: 22 mmol/L (ref 20–29)
Calcium: 9.8 mg/dL (ref 8.7–10.3)
Chloride: 99 mmol/L (ref 96–106)
Creatinine, Ser: 1.17 mg/dL — ABNORMAL HIGH (ref 0.57–1.00)
Globulin, Total: 2.9 g/dL (ref 1.5–4.5)
Glucose: 91 mg/dL (ref 70–99)
Potassium: 4.9 mmol/L (ref 3.5–5.2)
Sodium: 138 mmol/L (ref 134–144)
Total Protein: 7.8 g/dL (ref 6.0–8.5)
eGFR: 52 mL/min/{1.73_m2} — ABNORMAL LOW (ref >59)

## 2024-03-05 LAB — HEMOGLOBIN A1C
Est. average glucose Bld gHb Est-mCnc: 117 mg/dL
Hgb A1c MFr Bld: 5.7 % — ABNORMAL HIGH (ref 4.8–5.6)

## 2024-03-05 LAB — TSH: TSH: 4.62 u[IU]/mL — ABNORMAL HIGH (ref 0.450–4.500)

## 2024-03-05 LAB — DIGOXIN LEVEL: Digoxin, Serum: 1.6 ng/mL — ABNORMAL HIGH (ref 0.5–0.9)

## 2024-03-05 NOTE — Telephone Encounter (Signed)
Full VM 

## 2024-03-05 NOTE — Telephone Encounter (Signed)
-----   Message from Aundra Dubin Revankar sent at 03/05/2024  9:36 AM EDT ----- Stop digoxin for 1 week then take digoxin Monday Wednesday and Friday only.  Come back for dig level check in a month.  Vitamin D is low to talk to primary care copy primary Garwin Brothers, MD 03/05/2024 9:36 AM

## 2024-03-11 ENCOUNTER — Other Ambulatory Visit (HOSPITAL_COMMUNITY): Payer: Self-pay | Admitting: Cardiology

## 2024-03-13 ENCOUNTER — Telehealth: Payer: Self-pay

## 2024-03-13 DIAGNOSIS — Z8679 Personal history of other diseases of the circulatory system: Secondary | ICD-10-CM

## 2024-03-13 MED ORDER — DIGOXIN 125 MCG PO TABS: ORAL_TABLET | ORAL | 3 refills | Status: AC

## 2024-03-13 NOTE — Telephone Encounter (Signed)
-----   Message from Aundra Dubin Revankar sent at 03/05/2024  9:36 AM EDT ----- Stop digoxin for 1 week then take digoxin Monday Wednesday and Friday only.  Come back for dig level check in a month.  Vitamin D is low to talk to primary care copy primary Garwin Brothers, MD 03/05/2024 9:36 AM

## 2024-03-28 ENCOUNTER — Other Ambulatory Visit (HOSPITAL_COMMUNITY): Payer: Self-pay

## 2024-03-28 DIAGNOSIS — I5022 Chronic systolic (congestive) heart failure: Secondary | ICD-10-CM

## 2024-03-28 MED ORDER — METOPROLOL SUCCINATE ER 25 MG PO TB24
12.5000 mg | ORAL_TABLET | Freq: Every evening | ORAL | 0 refills | Status: DC
Start: 2024-03-28 — End: 2024-07-15

## 2024-04-16 ENCOUNTER — Other Ambulatory Visit (HOSPITAL_COMMUNITY): Payer: Self-pay | Admitting: Cardiology

## 2024-05-22 ENCOUNTER — Ambulatory Visit (HOSPITAL_BASED_OUTPATIENT_CLINIC_OR_DEPARTMENT_OTHER)
Admission: EM | Admit: 2024-05-22 | Discharge: 2024-05-22 | Disposition: A | Discharge status: Home / Self Care | Attending: Family Medicine | Admitting: Family Medicine

## 2024-05-22 ENCOUNTER — Encounter (HOSPITAL_BASED_OUTPATIENT_CLINIC_OR_DEPARTMENT_OTHER): Payer: Self-pay

## 2024-05-22 DIAGNOSIS — H6501 Acute serous otitis media, right ear: Secondary | ICD-10-CM | POA: Diagnosis not present

## 2024-05-22 MED ORDER — FLUTICASONE PROPIONATE 50 MCG/ACT NA SUSP: 1.0000 | Freq: Every day | NASAL | 2 refills | Status: AC

## 2024-05-22 NOTE — ED Triage Notes (Signed)
 Right ear pressure x 2-3 days. Does not take allergy medicine.

## 2024-05-22 NOTE — Discharge Instructions (Signed)
 There is not much you can do for this and it should resolve on its own. You can try Flonase  nasal spray and see if this may help. If it does not resolve you will need to see your PCP for referral to Ear, Nose and Throat specialist.

## 2024-05-22 NOTE — ED Provider Notes (Signed)
 Stacy Jenkins CARE    CSN: [id number] Arrival date & time: 05/22/24  1405      History   Chief Complaint Chief Complaint  Patient presents with   Otalgia    HPI Stacy Jenkins is a 63 y.o. female.   63 year old female here for left ear pressure, decreased hearing. No other symptoms. Maybe some mild congestion.  No sore throat, cough, fever, chills, body aches.   Otalgia   Past Medical History:  Diagnosis Date   Abnormal echocardiogram 08/20/2018   Added automatically from request for surgery [id number]   Acute blood loss anemia 11/19/2018   Acute hyperglycemia 03/10/2016   Acute on chronic systolic heart failure (HCC) 02/22/2023   Acute post-operative pain 11/19/2018   Aortic valve regurgitation 08/20/2018   Formatting of this note might be different from the original. Added automatically from request for surgery [id number]   Ascending aortic aneurysm (HCC)    ASD (atrial septal defect) 02/03/2023   Benign essential hypertension 08/09/2017   at goal on current meds  Formatting of this note might be different from the original. at goal on current meds   Cardiomyopathy (HCC) 01/29/2018   Dilated aortic root (HCC) 09/11/2012   Formatting of this note might be different from the original. 4.6 cm   Elevated blood-pressure reading without diagnosis of hypertension 01/29/2018   will monitor  Formatting of this note might be different from the original. will monitor   Expressive aphasia 11/20/2018   Hiatal hernia 01/29/2018   History of atrial fibrillation 11/20/2018   History of CVA (cerebrovascular accident) 08/09/2017   History of migraine 01/29/2018   Hyperlipidemia 01/08/2019   Hypertension 01/08/2019   Hypoglycemia 01/29/2018   dietary info  Formatting of this note might be different from the original. dietary info   Malnutrition of moderate degree 03/01/2023   Medicare annual wellness visit, subsequent 02/17/2019   Mixed dyslipidemia 08/09/2017   Nonrheumatic  aortic valve insufficiency    Nonrheumatic mitral valve regurgitation 03/01/2023   Other abnormal glucose 01/29/2018   PFO (patent foramen ovale) 09/11/2012   Postoperative atrial fibrillation (HCC) 11/20/2018   S/P ascending aortic aneurysm repair 11/20/2018   S/P AVR 11/19/2018   Stroke (cerebrum) (HCC) - Left MCA branchStroke in a patient with history of prior strokes and mild residual deficits, s/p tPA 07/13/2017   Stroke (HCC)    Vitamin D  deficiency 01/29/2018    Patient Active Problem List   Diagnosis Date Noted   Malnutrition of moderate degree 03/01/2023   Nonrheumatic mitral valve regurgitation 03/01/2023   Acute on chronic systolic heart failure (HCC) 02/22/2023   ASD (atrial septal defect) 02/03/2023   Nonrheumatic aortic valve insufficiency    Stroke [facility])    Medicare annual wellness visit, subsequent 02/17/2019   Hyperlipidemia 01/08/2019   Hypertension 01/08/2019   Expressive aphasia 11/20/2018   History of atrial fibrillation 11/20/2018   Postoperative atrial fibrillation (HCC) 11/20/2018   S/P ascending aortic aneurysm repair 11/20/2018   Acute blood loss anemia 11/19/2018   Acute post-operative pain 11/19/2018   S/P AVR 11/19/2018   Aortic valve regurgitation 08/20/2018   Abnormal echocardiogram 08/20/2018   Cardiomyopathy (HCC) 01/29/2018   Elevated blood-pressure reading without diagnosis of hypertension 01/29/2018   Hiatal hernia 01/29/2018   History of migraine 01/29/2018   Hypoglycemia 01/29/2018   Other abnormal glucose 01/29/2018   Vitamin D  deficiency 01/29/2018   Benign essential hypertension 08/09/2017   Mixed dyslipidemia 08/09/2017   Ascending aortic aneurysm (HCC)  Stroke (cerebrum) (HCC) - Left MCA branchStroke in a patient with history of prior strokes and mild residual deficits, s/p tPA 07/13/2017   Acute hyperglycemia 03/10/2016   PFO (patent foramen ovale) 09/11/2012   Dilated aortic root (HCC) 09/11/2012    Past Surgical History:   Procedure Laterality Date   aortic valve surgery  02/2016   RIGHT HEART CATH N/A 02/26/2023   Procedure: RIGHT HEART CATH;  Surgeon: Alwin Baars, DO;  Location: [facility] INVASIVE CV LAB;  Service: Cardiovascular;  Laterality: N/A;   TEE WITHOUT CARDIOVERSION N/A 07/16/2017   Procedure: TRANSESOPHAGEAL ECHOCARDIOGRAM (TEE);  Surgeon: Luana Rumple, MD;  Location: [facility] ENDOSCOPY;  Service: Cardiovascular;  Laterality: N/A;   TEE WITHOUT CARDIOVERSION N/A 03/01/2023   Procedure: TRANSESOPHAGEAL ECHOCARDIOGRAM (TEE);  Surgeon: Sheryle Donning, MD;  Location: [facility] ENDOSCOPY;  Service: Cardiovascular;  Laterality: N/A;    OB History   No obstetric history on file.      Home Medications    Prior to Admission medications   Medication Sig Start Date End Date Taking? Authorizing Provider  fluticasone (FLONASE) 50 MCG/ACT nasal spray Place 1 spray into both nostrils daily. 05/22/24  Yes Tennelle Taflinger A, FNP  amiodarone  (PACERONE ) 200 MG tablet Take 1 tablet (200 mg total) by mouth daily. 03/03/24   Revankar, Micael Adas, MD  apixaban  (ELIQUIS ) 5 MG TABS tablet Take 1 tablet (5 mg total) by mouth 2 (two) times daily. 01/21/19   Revankar, Micael Adas, MD  digoxin  (LANOXIN ) 0.125 MG tablet Take on Monday, Wednesday and Friday. 03/13/24   Revankar, Micael Adas, MD  FARXIGA  10 MG TABS tablet TAKE 1 TABLET BY MOUTH ONCE DAILY BEFORE BREAKFAST 03/13/24   Sabharwal, Aditya, DO  furosemide  (LASIX ) 40 MG tablet TAKE 1 TABLET BY MOUTH ONCE DAILY . APPOINTMENT REQUIRED FOR FUTURE REFILLS 01/10/24   Sabharwal, Aditya, DO  KLOR-CON  M20 20 MEQ tablet Take 1 tablet by mouth once daily 04/17/24   Sabharwal, Aditya, DO  losartan  (COZAAR ) 25 MG tablet Take 0.5 tablets (12.5 mg total) by mouth daily. 02/06/24   Revankar, Micael Adas, MD  metoprolol  succinate (TOPROL -XL) 25 MG 24 hr tablet Take 0.5 tablets (12.5 mg total) by mouth at bedtime. 03/28/24   Sheryl Donna, NP  rosuvastatin  (CRESTOR ) 5 MG tablet Take 1 tablet (5 mg total) by mouth  daily. 10/22/23   Revankar, Micael Adas, MD  spironolactone  (ALDACTONE ) 50 MG tablet Take 1 tablet (50 mg total) by mouth daily. 03/03/24   Revankar, Micael Adas, MD    Family History Family History  Problem Relation Age of Onset   Hypertension Mother    Heart disease Father    Heart attack Father    Heart disease Brother     Social History Social History   Tobacco Use   Smoking status: Never   Smokeless tobacco: Never  Vaping Use   Vaping status: Never Used  Substance Use Topics   Alcohol use: No   Drug use: No     Allergies   Patient has no active allergies.   Review of Systems Review of Systems  HENT:  Positive for ear pain.    See HPI  Physical Exam Triage Vital Signs ED Triage Vitals  Encounter Vitals Group     BP 05/22/24 1423 124/76     Systolic BP Percentile --      Diastolic BP Percentile --      Pulse Rate 05/22/24 1423 (!) 52     Resp 05/22/24 1423 20  Temp 05/22/24 1423 97.7 F (36.5 C)     Temp Source 05/22/24 1423 Oral     SpO2 05/22/24 1423 95 %     Weight --      Height --      Head Circumference --      Peak Flow --      Pain Score 05/22/24 1425 0     Pain Loc --      Pain Education --      Exclude from Growth Chart --    No data found.  Updated Vital Signs BP 124/76 (BP Location: Right Arm)   Pulse (!) 52   Temp 97.7 F (36.5 C) (Oral)   Resp 20   SpO2 95%   Visual Acuity Right Eye Distance:   Left Eye Distance:   Bilateral Distance:    Right Eye Near:   Left Eye Near:    Bilateral Near:     Physical Exam Vitals and nursing note reviewed.  Constitutional:      General: She is not in acute distress.    Appearance: Normal appearance. She is not ill-appearing.  HENT:     Right Ear: Decreased hearing noted. Tympanic membrane is bulging.     Left Ear: Hearing, tympanic membrane and ear canal normal.     Ears:     Comments: Serous OM     Mouth/Throat:     Pharynx: Oropharynx is clear.  Eyes:     Conjunctiva/sclera:  Conjunctivae normal.  Neurological:     Mental Status: She is alert.      UC Treatments / Results  Labs (all labs ordered are listed, but only abnormal results are displayed) Labs Reviewed - No data to display  EKG   Radiology No results found.  Procedures Procedures (including critical care time)  Medications Ordered in UC Medications - No data to display  Initial Impression / Assessment and Plan / UC Course  I have reviewed the triage vital signs and the nursing notes.  Pertinent labs & imaging results that were available during my care of the patient were reviewed by me and considered in my medical decision making (see chart for details).     Serous OM-reassured patient that there is not much she can do to treat this and should resolve on its own with time.  We can try some Flonase nasal spray and see if this may help.  If symptoms do not resolve she will need to follow-up with ENT specialist if symptoms worsen to include pain  Final Clinical Impressions(s) / UC Diagnoses   Final diagnoses:  Non-recurrent acute serous otitis media of right ear     Discharge Instructions      There is not much you can do for this and it should resolve on its own. You can try Flonase nasal spray and see if this may help. If it does not resolve you will need to see your PCP for referral to Ear, Nose and Throat specialist.   ED Prescriptions     Medication Sig Dispense Auth. Provider   fluticasone (FLONASE) 50 MCG/ACT nasal spray Place 1 spray into both nostrils daily. 16 g Landa Pine, FNP      PDMP not reviewed this encounter.   Landa Pine, FNP 05/22/24 [phone]

## 2024-05-22 NOTE — Progress Notes (Signed)
 Stacy Jenkins is a 63 y.o.female who follows in the [facility] for ongoing aortic surveillance of s/p VSRR at OSH(02/2016) and Wheat procedure with #21 Perceval valve for sev AI on 11/18/2018. SABRA Last seen 08/2023 with Cardiac MRI/Chest MRA. Plan for repeat imaging with Cardiac MRI/Chest MRA in 2 yrs.

## 2024-06-14 ENCOUNTER — Other Ambulatory Visit (HOSPITAL_COMMUNITY): Payer: Self-pay | Admitting: Cardiology

## 2024-07-15 ENCOUNTER — Other Ambulatory Visit (HOSPITAL_COMMUNITY): Payer: Self-pay

## 2024-07-15 DIAGNOSIS — I5022 Chronic systolic (congestive) heart failure: Secondary | ICD-10-CM

## 2024-07-15 MED ORDER — METOPROLOL SUCCINATE ER 25 MG PO TB24: 12.5000 mg | ORAL_TABLET | Freq: Every evening | ORAL | 0 refills | Status: AC

## 2024-07-29 ENCOUNTER — Other Ambulatory Visit (HOSPITAL_COMMUNITY): Payer: Self-pay | Admitting: Cardiology

## 2024-07-29 ENCOUNTER — Telehealth: Payer: Self-pay | Admitting: Cardiology

## 2024-07-29 NOTE — Telephone Encounter (Signed)
 Joesph Ellen, pt's daughter in law, asked if she can have a med list printed out to pick for pt. Please advise.

## 2024-07-29 NOTE — Telephone Encounter (Signed)
 Attempted to call the patient. Patient did not answer the phone and her voice mail box is full so unable to leave a message for her to call back. A copy of her medication list was left at the front desk for the patient to pick up, after we contact her.

## 2024-07-30 NOTE — Telephone Encounter (Signed)
 Attempted to call the patient. Patient did not answer the phone and her voice mail box is full so unable to leave a message for her to call back.

## 2024-07-31 ENCOUNTER — Other Ambulatory Visit: Payer: Self-pay

## 2024-07-31 ENCOUNTER — Other Ambulatory Visit (HOSPITAL_COMMUNITY): Payer: Self-pay | Admitting: Cardiology

## 2024-07-31 MED ORDER — POTASSIUM CHLORIDE CRYS ER 20 MEQ PO TBCR: 20.0000 meq | EXTENDED_RELEASE_TABLET | Freq: Every day | ORAL | 2 refills | Status: DC

## 2024-07-31 MED ORDER — FUROSEMIDE 40 MG PO TABS: 40.0000 mg | ORAL_TABLET | Freq: Every day | ORAL | 2 refills | Status: AC

## 2024-07-31 NOTE — Telephone Encounter (Signed)
 Called the patient and informed her that a copy of her medication list was left at the front desk for her to pick - up. Patient stated that her husband would come to the office and pick it up. Patient had no further questions at this time.

## 2024-08-04 ENCOUNTER — Other Ambulatory Visit (HOSPITAL_COMMUNITY): Payer: Self-pay | Admitting: Cardiology

## 2024-09-18 ENCOUNTER — Other Ambulatory Visit (HOSPITAL_COMMUNITY): Payer: Self-pay | Admitting: Cardiology

## 2024-10-22 ENCOUNTER — Other Ambulatory Visit: Payer: Self-pay

## 2024-10-22 MED ORDER — ROSUVASTATIN CALCIUM 5 MG PO TABS: 5.0000 mg | ORAL_TABLET | Freq: Every day | ORAL | 0 refills | Status: DC

## 2024-10-31 ENCOUNTER — Other Ambulatory Visit (HOSPITAL_COMMUNITY): Payer: Self-pay

## 2025-01-16 ENCOUNTER — Other Ambulatory Visit: Payer: Self-pay | Admitting: Cardiology
